# Patient Record
Sex: Female | Born: 1945
Health system: Southern US, Community
[De-identification: ages and names within clinical notes are randomized; demographics above are authoritative.]

## PROBLEM LIST (undated history)

## (undated) DIAGNOSIS — R05 Cough: Secondary | ICD-10-CM

## (undated) DIAGNOSIS — R233 Spontaneous ecchymoses: Secondary | ICD-10-CM

## (undated) DIAGNOSIS — R059 Cough, unspecified: Secondary | ICD-10-CM

## (undated) DIAGNOSIS — R5383 Other fatigue: Secondary | ICD-10-CM

## (undated) DIAGNOSIS — G4730 Sleep apnea, unspecified: Secondary | ICD-10-CM

## (undated) DIAGNOSIS — R0602 Shortness of breath: Secondary | ICD-10-CM

## (undated) DIAGNOSIS — J349 Unspecified disorder of nose and nasal sinuses: Secondary | ICD-10-CM

## (undated) DIAGNOSIS — Z460 Encounter for fitting and adjustment of spectacles and contact lenses: Secondary | ICD-10-CM

## (undated) DIAGNOSIS — M549 Dorsalgia, unspecified: Secondary | ICD-10-CM

## (undated) DIAGNOSIS — E785 Hyperlipidemia, unspecified: Secondary | ICD-10-CM

## (undated) DIAGNOSIS — R238 Other skin changes: Secondary | ICD-10-CM

## (undated) DIAGNOSIS — M1990 Unspecified osteoarthritis, unspecified site: Secondary | ICD-10-CM

## (undated) DIAGNOSIS — R0989 Other specified symptoms and signs involving the circulatory and respiratory systems: Secondary | ICD-10-CM

## (undated) DIAGNOSIS — I10 Essential (primary) hypertension: Secondary | ICD-10-CM

## (undated) HISTORY — DX: Dorsalgia, unspecified: M54.9

## (undated) HISTORY — DX: Encounter for fitting and adjustment of spectacles and contact lenses: Z46.0

## (undated) HISTORY — DX: Other fatigue: R53.83

## (undated) HISTORY — DX: Cough, unspecified: R05.9

## (undated) HISTORY — DX: Hyperlipidemia, unspecified: E78.5

## (undated) HISTORY — DX: Other skin changes: R23.8

## (undated) HISTORY — DX: Other specified symptoms and signs involving the circulatory and respiratory systems: R09.89

## (undated) HISTORY — DX: Unspecified osteoarthritis, unspecified site: M19.90

## (undated) HISTORY — DX: Unspecified disorder of nose and nasal sinuses: J34.9

## (undated) HISTORY — DX: Essential (primary) hypertension: I10

## (undated) HISTORY — DX: Spontaneous ecchymoses: R23.3

## (undated) HISTORY — PX: HERNIA REPAIR: SHX51

## (undated) HISTORY — DX: Cough: R05

## (undated) HISTORY — DX: Sleep apnea, unspecified: G47.30

## (undated) HISTORY — DX: Shortness of breath: R06.02

## (undated) HISTORY — PX: CATARACT EXTRACTION: SUR2

---

## 1985-07-10 HISTORY — PX: CHOLECYSTECTOMY: SHX55

## 1998-01-20 ENCOUNTER — Ambulatory Visit (HOSPITAL_COMMUNITY): Admission: RE | Admit: 1998-01-20 | Discharge: 1998-01-21 | Payer: Self-pay | Admitting: Cardiology

## 1998-02-17 ENCOUNTER — Ambulatory Visit (HOSPITAL_BASED_OUTPATIENT_CLINIC_OR_DEPARTMENT_OTHER): Admission: RE | Admit: 1998-02-17 | Discharge: 1998-02-17 | Payer: Self-pay | Admitting: Surgery

## 2001-01-28 ENCOUNTER — Ambulatory Visit (HOSPITAL_COMMUNITY): Admission: RE | Admit: 2001-01-28 | Discharge: 2001-01-29 | Payer: Self-pay | Admitting: Ophthalmology

## 2001-01-28 ENCOUNTER — Encounter: Payer: Self-pay | Admitting: Ophthalmology

## 2001-07-10 HISTORY — PX: EYE SURGERY: SHX253

## 2002-02-13 ENCOUNTER — Ambulatory Visit (HOSPITAL_COMMUNITY): Admission: RE | Admit: 2002-02-13 | Discharge: 2002-02-14 | Payer: Self-pay | Admitting: *Deleted

## 2009-06-17 ENCOUNTER — Ambulatory Visit (HOSPITAL_COMMUNITY): Admission: RE | Admit: 2009-06-17 | Discharge: 2009-06-17 | Payer: Self-pay | Admitting: Surgery

## 2009-07-15 ENCOUNTER — Encounter: Admission: RE | Admit: 2009-07-15 | Discharge: 2009-10-13 | Payer: Self-pay | Admitting: Surgery

## 2010-02-17 ENCOUNTER — Encounter: Admission: RE | Admit: 2010-02-17 | Discharge: 2010-04-07 | Payer: Self-pay | Admitting: Surgery

## 2010-03-22 ENCOUNTER — Ambulatory Visit (HOSPITAL_COMMUNITY): Admission: RE | Admit: 2010-03-22 | Discharge: 2010-03-23 | Payer: Self-pay | Admitting: Surgery

## 2010-03-22 HISTORY — PX: LAPAROSCOPIC GASTRIC BANDING: SHX1100

## 2010-04-05 ENCOUNTER — Encounter
Admission: RE | Admit: 2010-04-05 | Discharge: 2010-04-05 | Payer: Self-pay | Source: Home / Self Care | Attending: Surgery | Admitting: Surgery

## 2010-05-17 ENCOUNTER — Encounter
Admission: RE | Admit: 2010-05-17 | Discharge: 2010-08-09 | Payer: Self-pay | Source: Home / Self Care | Attending: Surgery | Admitting: Surgery

## 2010-09-15 ENCOUNTER — Encounter: Admit: 2010-09-15 | Payer: Self-pay | Admitting: Surgery

## 2010-09-15 ENCOUNTER — Ambulatory Visit: Payer: Self-pay | Admitting: *Deleted

## 2010-09-22 LAB — GLUCOSE, CAPILLARY
Glucose-Capillary: 212 mg/dL — ABNORMAL HIGH (ref 70–99)
Glucose-Capillary: 244 mg/dL — ABNORMAL HIGH (ref 70–99)
Glucose-Capillary: 247 mg/dL — ABNORMAL HIGH (ref 70–99)
Glucose-Capillary: 248 mg/dL — ABNORMAL HIGH (ref 70–99)
Glucose-Capillary: 265 mg/dL — ABNORMAL HIGH (ref 70–99)

## 2010-09-22 LAB — COMPREHENSIVE METABOLIC PANEL
ALT: 35 U/L (ref 0–35)
AST: 32 U/L (ref 0–37)
Albumin: 3.8 g/dL (ref 3.5–5.2)
Alkaline Phosphatase: 68 U/L (ref 39–117)
BUN: 15 mg/dL (ref 6–23)
CO2: 26 mEq/L (ref 19–32)
Calcium: 9.3 mg/dL (ref 8.4–10.5)
Chloride: 100 mEq/L (ref 96–112)
Creatinine, Ser: 0.99 mg/dL (ref 0.4–1.2)
GFR calc Af Amer: >60 mL/min (ref >60)
GFR calc non Af Amer: 56 mL/min — ABNORMAL LOW (ref >60)
Glucose, Bld: 264 mg/dL — ABNORMAL HIGH (ref 70–99)
Potassium: 4.6 mEq/L (ref 3.5–5.1)
Sodium: 137 mEq/L (ref 135–145)
Total Bilirubin: 0.6 mg/dL (ref 0.3–1.2)
Total Protein: 7.2 g/dL (ref 6.0–8.3)

## 2010-09-22 LAB — CBC
HCT: 48.8 % — ABNORMAL HIGH (ref 36.0–46.0)
Hemoglobin: 16.2 g/dL — ABNORMAL HIGH (ref 12.0–15.0)
MCH: 29.6 pg (ref 26.0–34.0)
MCHC: 33.2 g/dL (ref 30.0–36.0)
MCV: 88.6 fL (ref 78.0–100.0)
MCV: 89.2 fL (ref 78.0–100.0)
Platelets: 170 10*3/uL (ref 150–400)
Platelets: 200 10*3/uL (ref 150–400)
RBC: 4.8 MIL/uL (ref 3.87–5.11)
RBC: 5.47 MIL/uL — ABNORMAL HIGH (ref 3.87–5.11)
RDW: 14.1 % (ref 11.5–15.5)
WBC: 7.6 10*3/uL (ref 4.0–10.5)
WBC: 8.7 10*3/uL (ref 4.0–10.5)

## 2010-09-22 LAB — DIFFERENTIAL
Eosinophils Absolute: 0.1 10*3/uL (ref 0.0–0.7)
Eosinophils Relative: 1 % (ref 0–5)
Eosinophils Relative: 1 % (ref 0–5)
Lymphocytes Relative: 24 % (ref 12–46)
Lymphs Abs: 2.1 10*3/uL (ref 0.7–4.0)
Lymphs Abs: 2.2 10*3/uL (ref 0.7–4.0)
Monocytes Relative: 6 % (ref 3–12)
Neutro Abs: 5.9 10*3/uL (ref 1.7–7.7)
Neutrophils Relative %: 68 % (ref 43–77)

## 2010-09-22 LAB — SURGICAL PCR SCREEN: MRSA, PCR: NEGATIVE

## 2010-09-29 ENCOUNTER — Encounter: Payer: Medicare Other | Attending: Surgery | Admitting: *Deleted

## 2010-09-29 DIAGNOSIS — Z09 Encounter for follow-up examination after completed treatment for conditions other than malignant neoplasm: Secondary | ICD-10-CM | POA: Insufficient documentation

## 2010-09-29 DIAGNOSIS — Z713 Dietary counseling and surveillance: Secondary | ICD-10-CM | POA: Insufficient documentation

## 2010-09-29 DIAGNOSIS — Z9884 Bariatric surgery status: Secondary | ICD-10-CM | POA: Insufficient documentation

## 2010-11-25 NOTE — Cardiovascular Report (Signed)
NAME:  Holly Spence, Holly Spence                          ACCOUNT NO.:  [id number]   MEDICAL RECORD NO.:  [id number]                   PATIENT TYPE:  OIB   LOCATION:  2899                                 FACILITY:  [facility]   PHYSICIAN:  Learta Codding, M.D. [facility]             DATE OF BIRTH:  16-Jul-1945   DATE OF PROCEDURE:  DATE OF DISCHARGE:                              CARDIAC CATHETERIZATION   PROCEDURE:  1. Left heart catheterization with selective coronary angiography.  2. Ventriculography.   DIAGNOSES:  1. No evidence of flow-limiting epicardial coronary artery disease.  2. Normal left ventricular systolic function.  3. Small antegrade dissection of common femoral artery (iatrogenic) with     preserved distal pulses.   CARDIOLOGIST:  Learta Codding, M.D.   INDICATIONS FOR PROCEDURE:  The patient is a 65 year old white female with  multiple risk factors for coronary artery disease.  The patient had a  catheterization in 1999 by Dr. Clarene Duke with no evidence of CAD.  However, the  patient now reports substernal chest pain and underwent a Cardiolite study  which reportedly was positive.  The patient has been referred by Dr.  Tomie China for diagnostic catheterization to assess the coronary anatomy.   DESCRIPTION OF PROCEDURE:  After informed consent was obtained, the patient  was brought to the catheterization laboratory.  The right groin was  sterilely prepped and draped.  Right groin access was extremely difficult  due to the patient's morbid obesity.  Several attempts were needed to access  the common femoral artery and place a 6-French long sheath.  On initial  attempt on entering the femoral artery, difficulty was encountered advancing  the wire.  The wire and stent were pulled back as well as the needle.  On  the second attempt, the artery was successfully cannulated, and a 6-French  arterial long sheath was placed using modified Seldinger technique.  Position of catheter was confirmed  with injection of contrast.  It did  appear that there was a small antegrade dissection plane in the distal  common femoral artery; however, there was normal distal flow.  In addition,  the patient throughout the procedure maintained good distal pulses.  Following this,  6-French JR4 and JL4 catheters were used to engage the left  and right coronary arteries, respectively.  Selective coronary angiography  was performed in various projections using manual injection of contrast.  Following coronary angiography, a 6-French angled pigtail catheter was  placed in the left ventricular cavity, and left heart hemodynamics were  obtained.  Ventriculography was performed in single plane RAO projection  with power  injection of contrast.  At the termination of the procedure, all  catheters and  sheaths were removed, and the patient was brought back to the  holding area.  Instructions were given to provide adequate hemostasis, but  at the time of transfer to the holding area, no hematoma was noted around  the sheath. In addition, the patient had 2+ distal pulses bilaterally.  No  other complications were encountered.   FINDINGS:   HEMODYNAMICS:  Aortic pressure 127/75 mmHg, left ventricular pressure 127/16  mmHg.   VENTRICULOGRAPHY:  Ejection fraction 60% without segmental wall motion  abnormality.  There was no mitral regurgitation.    SELECTIVE CORONARY ANGIOGRAPHY:  1. The left main coronary artery was free of flow-limiting epicardial     coronary artery disease.  2. The left anterior descending artery was a moderate size vessel giving     rise to a several diagonal branches which were free of  free of flow-     limiting disease.  3. The circumflex coronary artery did not demonstrate flow-limiting CAD.  4. The right coronary artery is a large caliber vessel with no evidence of     flow-limiting coronary artery disease.  The RCA terminates in posterior     descending artery and two  posterolateral branches.   DISTAL AORTOGRAPHY:  No aneurysm was seen.  The bilateral renal arteries  were patent.  As documented above, there appeared to be a small dissection  plane in the distal common femoral artery without impairment of flow.   RECOMMENDATIONS:  The patient appeared to have had a false positive  Cardiolite study.  There is no evidence of epicardial coronary artery  disease.  A 36-hour bed rest is needed, and distal pulses will be closely  monitored due to small iatrogenic antegrade dissection of the common femoral  artery.   PLAN:  Keep the patient overnight.  Monitor her pulses closely, and obtain  arterial and venous Dopplers of the right groin in the morning.  If the  patient has no bleeding and no pain complaints, she can be discharged.                                               Learta Codding, M.D. [facility]    GED/MEDQ  D:  02/13/2002  T:  02/18/2002  Job:  [id number]

## 2010-11-25 NOTE — Discharge Summary (Signed)
NAME:  Holly Spence, Holly Spence                          ACCOUNT NO.:  [id number]   MEDICAL RECORD NO.:  [id number]                   PATIENT TYPE:  OIB   LOCATION:  6532                                 FACILITY:  [facility]   PHYSICIAN:  Joellyn Rued                        DATE OF BIRTH:  1946/03/12   DATE OF ADMISSION:  02/13/2002  DATE OF DISCHARGE:                                 DISCHARGE SUMMARY   SUMMARY OF HISTORY:  Holly Spence is a 65 year old white female who was  referred to [facility] for cardiac catheterization.  She describes a four-  week history of chest discomfort which she describes as an aching radiating  into her neck and associated with shortness of breath and diaphoresis.  She  states that the discomfort occurs with activity, at rest, and at night.  It  is on and off in about 30 seconds.  There is no change with food or movement  with exertion or position.  The patient states she saw her primary MD and  had Cardiolite on 02/05/2002.  It revealed anterior ischemia.  Thus she was  referred to Dr. Cherlynn June he had given her a prescription for  nitroglycerin and referred her here for cardiac catheterization.  She has  not used any nitroglycerin; however she has had continuing episodes of chest  discomfort which seem more frequent at night. She also has a history of  hypertension, non-insulin-dependent diabetes mellitus, asthma, arthritis,  obesity, and hiatal hernia.  Her lipid status is unknown.  There is an early  family history of coronary artery disease.   LABORATORY DATA:  At [facility], sodium was 137, potassium3.9,  glucose 180, BUN 14,creatinine 0.9.  TSH was 0.92.  PT 10.0, PTT 26.3.  Hemoglobin 11.9, hematocrit 35.6, MCV slightly low at 74,  MCHC  slightly  low at 24.6, platelets 372, WBC 9.6.   Chest x-ray did not show any acute abnormalities.   HOSPITAL COURSE:  The patient came to day care and underwent cardiac  catheterization by Dr. Andee Lineman.  According to  his progress note, her EF was  60%.  She did not have any coronary artery, renal artery, or lower extremity  peripheral vascular disease.  Dr. Andee Lineman notes that during the procedure,  she had a small antegrade dissection in the OFA secondary to unsuccessful  stick.  Sheath removed on bedrest.  She was ambulating in the halls without  difficulty and catheterization site was intact.  Right groin Dopplers were  obtained to make sure she did not have a pseudoaneurysm or A-V fistula with  a complicated stick.   On the morning of August 8, hemoglobin, hematocrit, and chemistries were  unchanged, and it was felt that she could be discharged home.   DISCHARGE DIAGNOSES:  1. Atypical chest discomfort with a false positive stress Cardiolite.  She  did not have any coronary artery disease, and she has normal left     ventricular function.  She has multiple cardiac risk factors such as     obesity, diabetes, remote tobacco, and possible hyperlipidemia.  She also     has hyperchromic normocytic anemia, unknown if this has been evaluated in     the past.   DISPOSITION:  She is discharged home.   DISCHARGE MEDICATIONS:  She was asked to continue her home medications to  include:  1. Coated aspirin 325 mg q.d.  2. Allegra 180 q.d.  3. Altace 2.5 q.d.  4. She was asked to resume Glucophage 500 mg 2 tablets b.i.d.  5. HCTZ 25 q.d.  6. Cardia XT 180 q.d.  7. Accolate 20 mg b.i.d.  8. Bextra 20 mg q.d.  9. Paxil 20 mg q.d.  10.      Avandia 8 mg q.d.  11.      Omeprazole  20 mg q.d.  12.      She was told she did not necessarily need sublingual nitroglycerin.   ACTIVITY:  No lifting, driving, sexual activity, or heavy exertion for two  days.   DIET:  Low fat, low cholesterol, ADA.   FOLLOW UP:  If she has any problems with her catheterization site, she was  asked to call Dr. Tomie China immediately.  She was asked to call Dr. Tomie China  to arrange a followup one to two-week appointment with  him.  Consideration  should be given to evaluate hyperchromic normocytic anemia, lipid status,  and possible sleep apnea.  (The patient does state that during the night she  does wake up because she stops breathing.  However, she has not been  evaluated for sleep apnea.)                                                Joellyn Rued    EW/MEDQ  D:  02/14/2002  T:  02/18/2002  Job:  [id number]   cc:   Primus Bravo R. Revankar, M.D.

## 2011-01-31 ENCOUNTER — Encounter: Payer: Self-pay | Admitting: *Deleted

## 2011-01-31 ENCOUNTER — Encounter: Payer: Medicare Other | Attending: Surgery | Admitting: *Deleted

## 2011-01-31 DIAGNOSIS — Z9884 Bariatric surgery status: Secondary | ICD-10-CM

## 2011-01-31 DIAGNOSIS — Z09 Encounter for follow-up examination after completed treatment for conditions other than malignant neoplasm: Secondary | ICD-10-CM | POA: Insufficient documentation

## 2011-01-31 DIAGNOSIS — Z713 Dietary counseling and surveillance: Secondary | ICD-10-CM | POA: Insufficient documentation

## 2011-01-31 HISTORY — DX: Bariatric surgery status: Z98.84

## 2011-01-31 NOTE — Progress Notes (Signed)
  Follow-up visit: 9-10 Month Post-Operative LAGB Surgery  Medical Nutrition Therapy:  Appt start time: 1000 end time:  1030.  Assessment:  Primary concerns today: post-operative bariatric surgery nutrition management.  Weight today: 262.2 lbs Weight change: 7 lbs gain March 2012 Total weight lost: 52.8 lbs total BMI: 49.5% Weight goal: 150-200 % Weight goal met: 48%  24-hr recall:  B (10 AM): 1/2 cup grits, 1 egg, 1 pc toast (with NS jelly) Snk (AM): SKIPS  L (12-2 PM): cottage cheese with pineapple OR SF pudding  Snk (PM): SKIPS D (6-7 PM): 2 oz roast beef, fresh vegetables Snk (7-9 PM): yogurt cup or 1 pc fruit  Fluid intake: water (propel), milk, tomato juice, SF drink mix = 48-64 oz Estimated total protein intake: 40-60g  Medications: Lantus (15 units hs), Lisinopril (10mg ), Glipizide ER (10mg , am) Supplementation: Pt following ASMBS recommendations  CBG monitoring: qd Average CBG per patient: FBS= 125-150 (Pt reports 3 "blood sugar crashes" of <70) Last patient reported A1c: 7% (per pt)  Using straws: No Drinking while eating: No Hair loss: Non reported  Carbonated beverages: None N/V/D/C: Pt reports that she "throws up phlegm" when eating too fast; Pt reports that its hard to chew food well due to missing teeth. Last Lap-Band fill: March 2012 (Pt reports 2 fills total)  Recent physical activity:  "I don't exercise too much because its too hot and my back bothers me too bad"  Progress Towards Goal(s):  In progress.   Nutritional Diagnosis:  Catharine-3.3 Overweight/obesity As related to recent LAGB surgery.  As evidenced by pt continuing to follow LAGB guidelines with limited physical activity.    Intervention:    Follow Phase 3B: High Protein + Non-Starchy Vegetables  Eat 3-6 small meals/snacks, every 3-5 hrs  Increase lean protein foods to meet 70g goal  Increase fluid intake to 64oz +  Add 15 grams of carbohydrate (fruit, whole grain, starchy vegetable) with  meals  Avoid drinking 15 minutes before, during and 30 minutes after eating  Aim for >30 min of physical activity daily  Monitoring/Evaluation:  Dietary intake, exercise, lap band fills, and body weight. Follow up in 3 months for 12 month post-op visit.

## 2011-01-31 NOTE — Patient Instructions (Signed)
Goals:  Follow Phase 3B: High Protein + Non-Starchy Vegetables  Eat 3-6 small meals/snacks, every 3-5 hrs  Increase lean protein foods to meet 70g goal  Increase fluid intake to 64oz +  Add 15 grams of carbohydrate (fruit, whole grain, starchy vegetable) with meals  Avoid drinking 15 minutes before, during and 30 minutes after eating  Aim for >30 min of physical activity daily 

## 2011-02-01 ENCOUNTER — Encounter (INDEPENDENT_AMBULATORY_CARE_PROVIDER_SITE_OTHER): Payer: Self-pay | Admitting: General Surgery

## 2011-02-02 ENCOUNTER — Ambulatory Visit (INDEPENDENT_AMBULATORY_CARE_PROVIDER_SITE_OTHER): Payer: 59 | Admitting: Surgery

## 2011-02-02 ENCOUNTER — Encounter (INDEPENDENT_AMBULATORY_CARE_PROVIDER_SITE_OTHER): Payer: Self-pay | Admitting: Surgery

## 2011-02-02 DIAGNOSIS — Z4651 Encounter for fitting and adjustment of gastric lap band: Secondary | ICD-10-CM

## 2011-02-02 DIAGNOSIS — Z9884 Bariatric surgery status: Secondary | ICD-10-CM

## 2011-02-02 NOTE — Patient Instructions (Signed)
Stay on liquids for 2 days

## 2011-02-02 NOTE — Progress Notes (Signed)
Patient returns and has gained a little bit away. She hasn't been fill since March 22. Today's weight is 262. She reports no reflux since we repaired her hiatal hernia. Today had a half cc to her band. Will see her back in 8 weeks.  She does report some drooling at night which she has done her whole life. She's not having any new nocturnal reflux. She has been eating carbohydrates in that his slow her weight loss. I encouraged her to stay on a high protein diet.  Past Medical History  Diagnosis Date  . Diabetes mellitus   . Hypertension   . Hyperlipidemia   . Arthritis   . Sleep apnea   . Back pain    No current outpatient prescriptions on file.

## 2011-03-22 ENCOUNTER — Encounter (INDEPENDENT_AMBULATORY_CARE_PROVIDER_SITE_OTHER): Payer: Medicare Other | Admitting: Surgery

## 2011-05-02 ENCOUNTER — Ambulatory Visit: Payer: Medicare Other | Admitting: *Deleted

## 2011-05-04 ENCOUNTER — Encounter: Payer: Medicare Other | Attending: Surgery | Admitting: *Deleted

## 2011-05-04 ENCOUNTER — Encounter (INDEPENDENT_AMBULATORY_CARE_PROVIDER_SITE_OTHER): Payer: Self-pay | Admitting: Surgery

## 2011-05-04 ENCOUNTER — Encounter: Payer: Self-pay | Admitting: *Deleted

## 2011-05-04 ENCOUNTER — Ambulatory Visit: Payer: Medicare Other | Admitting: *Deleted

## 2011-05-04 ENCOUNTER — Ambulatory Visit (INDEPENDENT_AMBULATORY_CARE_PROVIDER_SITE_OTHER): Payer: Medicare Other | Admitting: Surgery

## 2011-05-04 VITALS — BP 132/86 | HR 60 | Temp 96.9°F | Resp 20 | Ht 61.0 in | Wt 266.1 lb

## 2011-05-04 DIAGNOSIS — Z9884 Bariatric surgery status: Secondary | ICD-10-CM

## 2011-05-04 DIAGNOSIS — Z09 Encounter for follow-up examination after completed treatment for conditions other than malignant neoplasm: Secondary | ICD-10-CM | POA: Insufficient documentation

## 2011-05-04 DIAGNOSIS — Z713 Dietary counseling and surveillance: Secondary | ICD-10-CM | POA: Insufficient documentation

## 2011-05-04 NOTE — Progress Notes (Signed)
  Follow-up visit: 13 Months Post-Operative LAGB Surgery  Medical Nutrition Therapy:  Appt start time: 1130 end time:  1200.  Assessment:  Primary concerns today: post-operative bariatric surgery nutrition management. Holly Spence reports that she has not been doing very well lately due to family stress at home. She notes that she has not had as much time to take care of herself and has found herself doing some "stress eating". She appears to be motivated to get back on track with her diet as she came in with a list of questions as to how she should change her eating plan. It appears she has been consuming increased amounts of carbohydrate foods and sweets.  Weight today: 266 lbs Weight change: 3.8 lb gain Total weight lost: 49 lbs total BMI: 50.4 Weight goal: 150-200 lbs  24-hr recall:  B (AM): 1 egg, 1 pc. Toast, 1/2 cup grits Snk ( AM): ice cream (1/2 cup)   L (PM): 1/2 can Light Progresso soup, 6 crackers Snk (PM): Skips  D (PM): Tuna (4 oz) OR 1 1/2 cup pinto beans  Snk (PM): Cookies (3-4)  Fluid intake: 50-60 oz (sugar free) Estimated total protein intake: 50-60g  Medications: See updated medication list Supplementation: Taking regularly  CBG monitoring: Daily Average CBG per patient: 150-200's (Fasting) Last patient reported A1c: 7.3%  No results found for this basename: HGBA1C   Using straws: No Drinking while eating: No Hair loss: No Carbonated beverages: No N/V/D/C: No Last Lap-Band fill: No recent band fill  Recent physical activity:  Limited due to mobility/lack of motivation to move  Progress Towards Goal(s):  Some progress.  Handouts given during visit include:  Carbohydrate counting card  Pre-Op Diet   Nutritional Diagnosis:  NI-5.8.2 Excessive carbohydrate intake As related to increased portions of carbohydrate rich foods and sweets.  As evidenced by elevated A1c, little to no weight loss since last visit.    Intervention:  Nutrition  education.  Monitoring/Evaluation:  Dietary intake, exercise, lap band fills, and body weight. Follow up in 1-2 months for 15-16 month post-op visit.

## 2011-05-04 NOTE — Patient Instructions (Signed)
Goals:  Follow Phase 3B: High Protein + Non-Starchy Vegetables  Or RESTART Pre-Op Diet Meal Format  Eat 3-6 small meals/snacks, every 3-5 hrs  Increase lean protein foods to meet 60-70g goal  Increase fluid intake to 64oz +  Consume only 15 grams of carbohydrate (fruit, whole grain, starchy vegetable) with meals if needed  Consume 5-15 grams protein with meals  Avoid drinking 15 minutes before, during and 30 minutes after eating  Aim for >30 min of physical activity daily

## 2011-05-04 NOTE — Progress Notes (Signed)
Holly Spence comes in with her friend and ride.  She has a 400 lb daughter who has been bringing sweets into the home making it difficult to stay on her diet.  She gags with bread and feels that she is too tight.  I encouraged her to stay on protein shakes and protein based diet.  Will see her back in 2 months.  No fill today. She is having no GER since repair of her HH>

## 2011-05-04 NOTE — Patient Instructions (Signed)
Avoid carbs See me in 2 months

## 2011-07-06 ENCOUNTER — Encounter (INDEPENDENT_AMBULATORY_CARE_PROVIDER_SITE_OTHER): Payer: Medicare Other | Admitting: Surgery

## 2011-07-28 ENCOUNTER — Encounter (INDEPENDENT_AMBULATORY_CARE_PROVIDER_SITE_OTHER): Payer: Medicare Other

## 2011-08-01 ENCOUNTER — Encounter: Payer: Medicare Other | Attending: Surgery | Admitting: *Deleted

## 2011-08-01 DIAGNOSIS — Z9884 Bariatric surgery status: Secondary | ICD-10-CM | POA: Insufficient documentation

## 2011-08-01 DIAGNOSIS — Z09 Encounter for follow-up examination after completed treatment for conditions other than malignant neoplasm: Secondary | ICD-10-CM | POA: Insufficient documentation

## 2011-08-01 DIAGNOSIS — Z713 Dietary counseling and surveillance: Secondary | ICD-10-CM | POA: Insufficient documentation

## 2011-08-01 NOTE — Patient Instructions (Signed)
Goals:  Follow up with Holly Spence on Band Fill Removal  Follow Phase 3B: High Protein + Non-Starchy Vegetables    - Or Restart Pre-Op Diet    Eat 3-6 small meals/snacks, every 3-5 hrs  Increase lean protein foods to meet 70g goal  Increase fluid intake to 64oz +  Avoid concentrated sweets  Avoid drinking 15 minutes before, during and 30 minutes after eating  Aim for >30 min of physical activity daily 

## 2011-08-01 NOTE — Progress Notes (Signed)
  Follow-up visit: 16 Months Post-Operative LAGB Surgery  Medical Nutrition Therapy:  Appt start time: 1010 end time:  1030.  Assessment:  Primary concerns today: post-operative bariatric surgery nutrition management. Holly Spence reports that over the holidays she has been very ill with flu like symptoms. She believes her band is too tight as she is not able to keep down solid pieces of meat. She does eat slowly and chew well, as she has always eaten this way. She does have a problem with overeating and overcompensating with bread, cereals, rice, and pasta. She notes poor food choices over the holidays as well with pie, cookies, and cake making their way into the diet. Pt to seen Mardelle Matte on 2/1 for band fill removal.  Weight today: 278.1 lbs Weight change: 12.1 lbs gain Total weight lost: 36.9 lbs BMI: 52.7% Weight goal: 150-200 lbs  Surgery date: 03/2010 [facility] weight at [facility]: 315 lbs  24-hr recall:  B (AM): 1 cup oatmeal OR 1/2 cup grits  Snk ( AM): ice cream (1/2 cup)  L (PM): 1/2 can Light Progresso soup, 6 crackers  Snk (PM): Skips  D (PM): 1-2 oz ham OR Protein Shake Snk (PM): Cookies (3-4) OR Ice cream (1-2 cups)  Fluid intake: 50-60 oz (sugar free)  Estimated total protein intake: 30-40g   Medications: See updated medication list; PCP increased Lantus to 20 units (from 10)  Supplementation: Taking regularly  CBG monitoring: 1-2 times/day Average CBG per patient: Before meals 150-200's; Multiple episodes of hypoglycemia (60-65) Last patient reported A1c: 8.3% per PCP (06/2011)  No results found for this basename: HGBA1C   Using straws: No  Drinking while eating: No  Hair loss: No  Carbonated beverages: No  N/V/D/C: No  Last Lap-Band fill: No recent band fill  Recent physical activity: Limited due to mobility issues, back pain, and lack of motivation to move  Progress Towards Goal(s): Some progress.   Handouts given during visit include:  Carbohydrate counting card  Pre-Op  Diet  Nutritional Diagnosis:  NI-5.8.2 Excessive carbohydrate intake As related to increased portions of carbohydrate rich foods and sweets. As evidenced by elevated A1c, little to no weight loss since last visit.   Intervention: Nutrition education.   Monitoring/Evaluation: Dietary intake, exercise, lap band fills, and body weight. Follow up in 1-2 months for 18-20 month post-op visit.

## 2011-08-11 ENCOUNTER — Encounter (INDEPENDENT_AMBULATORY_CARE_PROVIDER_SITE_OTHER): Payer: Medicare Other

## 2011-09-27 ENCOUNTER — Encounter (INDEPENDENT_AMBULATORY_CARE_PROVIDER_SITE_OTHER): Payer: Self-pay | Admitting: Surgery

## 2011-09-28 ENCOUNTER — Encounter (INDEPENDENT_AMBULATORY_CARE_PROVIDER_SITE_OTHER): Payer: Self-pay

## 2011-09-28 ENCOUNTER — Ambulatory Visit (INDEPENDENT_AMBULATORY_CARE_PROVIDER_SITE_OTHER): Payer: Medicare Other | Admitting: Physician Assistant

## 2011-09-28 VITALS — BP 138/74 | HR 72 | Temp 97.8°F | Resp 18 | Ht 61.0 in | Wt 280.8 lb

## 2011-09-28 DIAGNOSIS — Z4651 Encounter for fitting and adjustment of gastric lap band: Secondary | ICD-10-CM

## 2011-09-28 NOTE — Progress Notes (Signed)
  HISTORY: Holly Spence is a 66 y.o.female who received an AP-Large lap-band in September 2011 by Dr. Daphine Deutscher. She comes in complaining of frequent intolerance of solids, more bad days than good.  VITAL SIGNS: Filed Vitals:   09/28/11 1131  BP: 138/74  Pulse: 72  Temp: 97.8 F (36.6 C)  Resp: 18    PHYSICAL EXAM: Physical exam reveals a very well-appearing 66 y.o.female in no apparent distress Neurologic: Awake, alert, oriented Psych: Bright affect, conversant Respiratory: Breathing even and unlabored. No stridor or wheezing Abdomen: Soft, nontender, nondistended to palpation. Incisions well-healed. No incisional hernias. Port easily palpated. Extremities: Atraumatic, good range of motion.  ASSESMENT: 66 y.o.  female  s/p AP-Large lap-band.   PLAN: The patient's port was accessed with a 20G Huber needle without difficulty. Clear fluid was aspirated and 0.5 mL saline was removed from the port. I asked the patient to contact us next week if her obstructive symptoms persist despite fluid removal. She would need an upper GI study in this case. Otherwise I asked her to return in 4-6 weeks.

## 2011-09-28 NOTE — Patient Instructions (Signed)
Return in 4-6 weeks or sooner if needed. Contact Tracy (Dr. Ermalene Searing nurse) to schedule an upper GI study if you still have trouble with solid foods despite our last fluid removal.

## 2011-10-02 ENCOUNTER — Encounter: Payer: Self-pay | Admitting: *Deleted

## 2011-10-02 ENCOUNTER — Encounter: Payer: Medicare Other | Attending: Surgery | Admitting: *Deleted

## 2011-10-02 VITALS — Ht 61.0 in | Wt 279.2 lb

## 2011-10-02 DIAGNOSIS — Z713 Dietary counseling and surveillance: Secondary | ICD-10-CM | POA: Insufficient documentation

## 2011-10-02 DIAGNOSIS — Z9884 Bariatric surgery status: Secondary | ICD-10-CM | POA: Insufficient documentation

## 2011-10-02 DIAGNOSIS — Z09 Encounter for follow-up examination after completed treatment for conditions other than malignant neoplasm: Secondary | ICD-10-CM | POA: Insufficient documentation

## 2011-10-02 NOTE — Patient Instructions (Addendum)
Goals:  Follow up with Holly Spence on Band Fill Removal  Follow Phase 3B: High Protein + Non-Starchy Vegetables    - Or Restart Pre-Op Diet    Eat 3-6 small meals/snacks, every 3-5 hrs  Increase lean protein foods to meet 70g goal  Increase fluid intake to 64oz +  Avoid concentrated sweets  Avoid drinking 15 minutes before, during and 30 minutes after eating  Aim for >30 min of physical activity daily

## 2011-10-02 NOTE — Progress Notes (Signed)
  Follow-up visit: 18 Months Post-Operative LAGB Surgery  Medical Nutrition Therapy:  Appt start time: 1000 end time:  1030.  Assessment:  Primary concerns today: post-operative bariatric surgery nutrition management. Holly Spence had a band fill removal from [place] last week, and pt notes that since she has not had any regurgitation. Holly Spence reports extreme amounts of emotional stress. She states that she cannot take care of herself due to her family problems and stress.  Weight today: 279.2 lbs Weight change: n/a Total weight lost: 35.8 lbs BMI: 52.9% Weight goal: 150-200 lbs  Surgery date: 03/2010 Start weight at [facility]: 315 lbs  24-hr recall:  B (AM): Protein shake OR 1 egg w/ 1 piece of cheese Snk ( AM): yogurt cup (6 oz) OR SF jello cup L (PM): SKIPS d/t getting up late  Snk (2 PM): SF Jello cup or 2 oz cheese D (7-8 PM): 2 oz cheese w/ 1/2 cup canned pineapple OR Lima beans (1/2 cup), Pork roast (2 oz), 1 cup potatoes (fried) Snk (PM): n/a  Fluid intake: 50-60 oz (sugar free)  Estimated total protein intake: 60-70g   Medications: See updated medication list; PCP increased Lantus to 20 units (from 10)  Supplementation: Taking regularly  CBG monitoring: 1-2 times/day Average CBG per patient: Before meals 200-250's; 2hrs post: 160-170's;  Pt denies any hypoglycemia Last patient reported A1c: 8.3% per PCP (06/2011)  No results found for this basename: HGBA1C   Using straws: No  Drinking while eating: No  Hair loss: No  Carbonated beverages: No  N/V/D/C: No  Last Lap-Band fill: Mardelle Matte removed 1/2 cc fluid from band.  Recent physical activity: Limited due to mobility issues though hip pain is improving. Trying to walk dog 1-2 times/day for 10 minutes.  Progress Towards Goal(s): Some progress.   Handouts given during visit include:  Carbohydrate counting card  Pre-Op Diet  Nutritional Diagnosis:  NI-5.8.2 Excessive carbohydrate intake As related to increased portions of  carbohydrate rich foods and sweets. As evidenced by elevated A1c, little to no weight loss since last visit.   Intervention: Nutrition education.   Monitoring/Evaluation: Dietary intake, exercise, lap band fills, and body weight. Follow up in 3-4 months for 21-22 month post-op visit.

## 2011-11-09 ENCOUNTER — Encounter (INDEPENDENT_AMBULATORY_CARE_PROVIDER_SITE_OTHER): Payer: Medicare Other

## 2011-11-09 ENCOUNTER — Ambulatory Visit: Payer: Medicare Other | Admitting: *Deleted

## 2012-01-23 ENCOUNTER — Telehealth (INDEPENDENT_AMBULATORY_CARE_PROVIDER_SITE_OTHER): Payer: Self-pay | Admitting: General Surgery

## 2012-01-23 DIAGNOSIS — Z9884 Bariatric surgery status: Secondary | ICD-10-CM

## 2012-01-23 DIAGNOSIS — R109 Unspecified abdominal pain: Secondary | ICD-10-CM

## 2012-01-23 NOTE — Telephone Encounter (Signed)
Message copied by Latricia Heft on Tue Jan 23, 2012 11:30 AM ------      Message from: Zacarias Pontes      Created: Tue Jan 23, 2012 11:08 AM       Pt asked that you call her,she wants to have an UGI and she has some questions for you.Marland KitchenMarland [phone]

## 2012-01-23 NOTE — Telephone Encounter (Signed)
The patient states she is having pains in her stomach and per Oliva Bustard PA last office visit note if it continues we are to schedule an UGI. Patient has requested it be scheduled at [facility] spoke with Marchelle Folks at [facility] pt scheduled 01/25/12 @8 :00 pt to be NPO after midnight, pt requested for me to LM on AM if no answer. Notified the patient. Faxed order to [phone]

## 2012-01-29 ENCOUNTER — Encounter (INDEPENDENT_AMBULATORY_CARE_PROVIDER_SITE_OTHER): Payer: Self-pay | Admitting: Family Medicine

## 2012-01-29 ENCOUNTER — Encounter (INDEPENDENT_AMBULATORY_CARE_PROVIDER_SITE_OTHER): Payer: Self-pay

## 2012-01-31 ENCOUNTER — Telehealth (INDEPENDENT_AMBULATORY_CARE_PROVIDER_SITE_OTHER): Payer: Self-pay

## 2012-01-31 NOTE — Telephone Encounter (Signed)
The patient called for her UGI results.  Holly Spence had this done at [facility]  I gave her the results.  They were normal.  Holly Spence states Holly Spence will call back when Holly Spence has transportation arranged so Holly Spence can see Mardelle Matte.  Holly Spence is gaining weight.

## 2015-09-20 DIAGNOSIS — I10 Essential (primary) hypertension: Secondary | ICD-10-CM

## 2015-09-20 DIAGNOSIS — K219 Gastro-esophageal reflux disease without esophagitis: Secondary | ICD-10-CM | POA: Insufficient documentation

## 2015-09-20 DIAGNOSIS — E1165 Type 2 diabetes mellitus with hyperglycemia: Secondary | ICD-10-CM

## 2015-09-20 DIAGNOSIS — Z794 Long term (current) use of insulin: Secondary | ICD-10-CM

## 2015-09-20 DIAGNOSIS — IMO0001 Reserved for inherently not codable concepts without codable children: Secondary | ICD-10-CM | POA: Insufficient documentation

## 2015-09-20 DIAGNOSIS — E785 Hyperlipidemia, unspecified: Secondary | ICD-10-CM | POA: Insufficient documentation

## 2015-09-20 DIAGNOSIS — J309 Allergic rhinitis, unspecified: Secondary | ICD-10-CM

## 2015-09-20 HISTORY — DX: Allergic rhinitis, unspecified: J30.9

## 2015-09-20 HISTORY — DX: Essential (primary) hypertension: I10

## 2015-09-20 HISTORY — DX: Reserved for inherently not codable concepts without codable children: IMO0001

## 2015-09-20 HISTORY — DX: Gastro-esophageal reflux disease without esophagitis: K21.9

## 2015-09-20 HISTORY — DX: Long term (current) use of insulin: Z79.4

## 2015-11-10 DIAGNOSIS — E1169 Type 2 diabetes mellitus with other specified complication: Secondary | ICD-10-CM

## 2015-11-10 DIAGNOSIS — E088 Diabetes mellitus due to underlying condition with unspecified complications: Secondary | ICD-10-CM | POA: Insufficient documentation

## 2015-11-10 DIAGNOSIS — R002 Palpitations: Secondary | ICD-10-CM

## 2015-11-10 HISTORY — DX: Palpitations: R00.2

## 2015-11-10 HISTORY — DX: Type 2 diabetes mellitus with other specified complication: E11.69

## 2015-11-10 HISTORY — DX: Diabetes mellitus due to underlying condition with unspecified complications: E08.8

## 2015-12-25 DIAGNOSIS — L0390 Cellulitis, unspecified: Secondary | ICD-10-CM | POA: Diagnosis not present

## 2015-12-30 DIAGNOSIS — G8929 Other chronic pain: Secondary | ICD-10-CM | POA: Diagnosis not present

## 2015-12-30 DIAGNOSIS — L98499 Non-pressure chronic ulcer of skin of other sites with unspecified severity: Secondary | ICD-10-CM | POA: Diagnosis not present

## 2015-12-30 DIAGNOSIS — M1990 Unspecified osteoarthritis, unspecified site: Secondary | ICD-10-CM | POA: Diagnosis not present

## 2015-12-31 DIAGNOSIS — E11622 Type 2 diabetes mellitus with other skin ulcer: Secondary | ICD-10-CM | POA: Diagnosis not present

## 2015-12-31 DIAGNOSIS — T63301A Toxic effect of unspecified spider venom, accidental (unintentional), initial encounter: Secondary | ICD-10-CM | POA: Diagnosis not present

## 2015-12-31 DIAGNOSIS — E119 Type 2 diabetes mellitus without complications: Secondary | ICD-10-CM | POA: Diagnosis not present

## 2015-12-31 DIAGNOSIS — I10 Essential (primary) hypertension: Secondary | ICD-10-CM | POA: Diagnosis not present

## 2015-12-31 DIAGNOSIS — S61203A Unspecified open wound of left middle finger without damage to nail, initial encounter: Secondary | ICD-10-CM | POA: Diagnosis not present

## 2016-01-03 DIAGNOSIS — S61402A Unspecified open wound of left hand, initial encounter: Secondary | ICD-10-CM | POA: Diagnosis not present

## 2016-01-07 DIAGNOSIS — E119 Type 2 diabetes mellitus without complications: Secondary | ICD-10-CM | POA: Diagnosis not present

## 2016-01-07 DIAGNOSIS — S61253A Open bite of left middle finger without damage to nail, initial encounter: Secondary | ICD-10-CM | POA: Diagnosis not present

## 2016-01-07 DIAGNOSIS — T63301A Toxic effect of unspecified spider venom, accidental (unintentional), initial encounter: Secondary | ICD-10-CM | POA: Diagnosis not present

## 2016-02-02 DIAGNOSIS — E119 Type 2 diabetes mellitus without complications: Secondary | ICD-10-CM | POA: Diagnosis not present

## 2016-02-02 DIAGNOSIS — Z794 Long term (current) use of insulin: Secondary | ICD-10-CM | POA: Diagnosis not present

## 2016-02-22 DIAGNOSIS — I10 Essential (primary) hypertension: Secondary | ICD-10-CM | POA: Diagnosis not present

## 2016-02-22 DIAGNOSIS — Z794 Long term (current) use of insulin: Secondary | ICD-10-CM | POA: Diagnosis not present

## 2016-02-22 DIAGNOSIS — E1165 Type 2 diabetes mellitus with hyperglycemia: Secondary | ICD-10-CM | POA: Diagnosis not present

## 2016-02-22 DIAGNOSIS — E785 Hyperlipidemia, unspecified: Secondary | ICD-10-CM | POA: Diagnosis not present

## 2016-04-14 ENCOUNTER — Encounter (HOSPITAL_COMMUNITY): Payer: Self-pay

## 2016-05-03 DIAGNOSIS — Z794 Long term (current) use of insulin: Secondary | ICD-10-CM | POA: Diagnosis not present

## 2016-05-03 DIAGNOSIS — E119 Type 2 diabetes mellitus without complications: Secondary | ICD-10-CM | POA: Diagnosis not present

## 2016-05-03 DIAGNOSIS — E785 Hyperlipidemia, unspecified: Secondary | ICD-10-CM | POA: Diagnosis not present

## 2016-05-04 DIAGNOSIS — Z23 Encounter for immunization: Secondary | ICD-10-CM | POA: Diagnosis not present

## 2016-06-13 DIAGNOSIS — R05 Cough: Secondary | ICD-10-CM | POA: Diagnosis not present

## 2016-06-13 DIAGNOSIS — E1149 Type 2 diabetes mellitus with other diabetic neurological complication: Secondary | ICD-10-CM | POA: Diagnosis not present

## 2016-06-13 DIAGNOSIS — Z0000 Encounter for general adult medical examination without abnormal findings: Secondary | ICD-10-CM | POA: Diagnosis not present

## 2016-06-13 DIAGNOSIS — Z9181 History of falling: Secondary | ICD-10-CM | POA: Diagnosis not present

## 2016-06-13 DIAGNOSIS — Z1389 Encounter for screening for other disorder: Secondary | ICD-10-CM | POA: Diagnosis not present

## 2016-07-06 DIAGNOSIS — N6342 Unspecified lump in left breast, subareolar: Secondary | ICD-10-CM | POA: Diagnosis not present

## 2016-07-06 DIAGNOSIS — R05 Cough: Secondary | ICD-10-CM | POA: Diagnosis not present

## 2016-07-06 DIAGNOSIS — R928 Other abnormal and inconclusive findings on diagnostic imaging of breast: Secondary | ICD-10-CM | POA: Diagnosis not present

## 2016-07-06 DIAGNOSIS — N6321 Unspecified lump in the left breast, upper outer quadrant: Secondary | ICD-10-CM | POA: Diagnosis not present

## 2016-08-04 DIAGNOSIS — E119 Type 2 diabetes mellitus without complications: Secondary | ICD-10-CM | POA: Diagnosis not present

## 2016-08-04 DIAGNOSIS — Z794 Long term (current) use of insulin: Secondary | ICD-10-CM | POA: Diagnosis not present

## 2016-08-04 DIAGNOSIS — E785 Hyperlipidemia, unspecified: Secondary | ICD-10-CM | POA: Diagnosis not present

## 2016-08-30 DIAGNOSIS — J329 Chronic sinusitis, unspecified: Secondary | ICD-10-CM | POA: Diagnosis not present

## 2016-08-30 DIAGNOSIS — R600 Localized edema: Secondary | ICD-10-CM | POA: Diagnosis not present

## 2016-08-30 DIAGNOSIS — G629 Polyneuropathy, unspecified: Secondary | ICD-10-CM | POA: Diagnosis not present

## 2016-09-21 DIAGNOSIS — I10 Essential (primary) hypertension: Secondary | ICD-10-CM | POA: Diagnosis not present

## 2016-09-21 DIAGNOSIS — Z794 Long term (current) use of insulin: Secondary | ICD-10-CM | POA: Diagnosis not present

## 2016-09-21 DIAGNOSIS — E1165 Type 2 diabetes mellitus with hyperglycemia: Secondary | ICD-10-CM | POA: Diagnosis not present

## 2016-09-21 DIAGNOSIS — E785 Hyperlipidemia, unspecified: Secondary | ICD-10-CM | POA: Diagnosis not present

## 2016-09-22 DIAGNOSIS — T23229A Burn of second degree of unspecified single finger (nail) except thumb, initial encounter: Secondary | ICD-10-CM | POA: Diagnosis not present

## 2016-10-04 DIAGNOSIS — E119 Type 2 diabetes mellitus without complications: Secondary | ICD-10-CM | POA: Diagnosis not present

## 2016-10-04 DIAGNOSIS — E785 Hyperlipidemia, unspecified: Secondary | ICD-10-CM | POA: Diagnosis not present

## 2016-10-04 DIAGNOSIS — Z794 Long term (current) use of insulin: Secondary | ICD-10-CM | POA: Diagnosis not present

## 2016-10-06 DIAGNOSIS — M1990 Unspecified osteoarthritis, unspecified site: Secondary | ICD-10-CM | POA: Diagnosis not present

## 2016-10-06 DIAGNOSIS — R0602 Shortness of breath: Secondary | ICD-10-CM | POA: Diagnosis not present

## 2016-10-06 DIAGNOSIS — G8929 Other chronic pain: Secondary | ICD-10-CM | POA: Diagnosis not present

## 2016-10-06 DIAGNOSIS — R600 Localized edema: Secondary | ICD-10-CM | POA: Diagnosis not present

## 2016-10-06 DIAGNOSIS — R269 Unspecified abnormalities of gait and mobility: Secondary | ICD-10-CM | POA: Diagnosis not present

## 2016-10-23 ENCOUNTER — Encounter (HOSPITAL_COMMUNITY): Payer: Self-pay

## 2017-02-01 DIAGNOSIS — E119 Type 2 diabetes mellitus without complications: Secondary | ICD-10-CM | POA: Diagnosis not present

## 2017-02-01 DIAGNOSIS — E785 Hyperlipidemia, unspecified: Secondary | ICD-10-CM | POA: Diagnosis not present

## 2017-02-01 DIAGNOSIS — Z794 Long term (current) use of insulin: Secondary | ICD-10-CM | POA: Diagnosis not present

## 2017-02-08 DIAGNOSIS — E1165 Type 2 diabetes mellitus with hyperglycemia: Secondary | ICD-10-CM | POA: Diagnosis not present

## 2017-02-08 DIAGNOSIS — Z794 Long term (current) use of insulin: Secondary | ICD-10-CM | POA: Diagnosis not present

## 2017-02-08 DIAGNOSIS — E785 Hyperlipidemia, unspecified: Secondary | ICD-10-CM | POA: Diagnosis not present

## 2017-02-08 DIAGNOSIS — I10 Essential (primary) hypertension: Secondary | ICD-10-CM | POA: Diagnosis not present

## 2017-02-12 DIAGNOSIS — E785 Hyperlipidemia, unspecified: Secondary | ICD-10-CM | POA: Diagnosis not present

## 2017-02-12 DIAGNOSIS — M158 Other polyosteoarthritis: Secondary | ICD-10-CM | POA: Diagnosis not present

## 2017-02-12 DIAGNOSIS — Z79899 Other long term (current) drug therapy: Secondary | ICD-10-CM | POA: Diagnosis not present

## 2017-02-12 DIAGNOSIS — I10 Essential (primary) hypertension: Secondary | ICD-10-CM | POA: Diagnosis not present

## 2017-02-12 DIAGNOSIS — Z9119 Patient's noncompliance with other medical treatment and regimen: Secondary | ICD-10-CM | POA: Diagnosis not present

## 2017-02-12 DIAGNOSIS — E1165 Type 2 diabetes mellitus with hyperglycemia: Secondary | ICD-10-CM | POA: Diagnosis not present

## 2017-02-12 DIAGNOSIS — E559 Vitamin D deficiency, unspecified: Secondary | ICD-10-CM | POA: Diagnosis not present

## 2017-04-17 DIAGNOSIS — R269 Unspecified abnormalities of gait and mobility: Secondary | ICD-10-CM | POA: Diagnosis not present

## 2017-04-17 DIAGNOSIS — G8929 Other chronic pain: Secondary | ICD-10-CM | POA: Diagnosis not present

## 2017-04-17 DIAGNOSIS — R600 Localized edema: Secondary | ICD-10-CM | POA: Diagnosis not present

## 2017-04-17 DIAGNOSIS — R0602 Shortness of breath: Secondary | ICD-10-CM | POA: Diagnosis not present

## 2017-04-17 DIAGNOSIS — M1990 Unspecified osteoarthritis, unspecified site: Secondary | ICD-10-CM | POA: Diagnosis not present

## 2017-05-04 DIAGNOSIS — E785 Hyperlipidemia, unspecified: Secondary | ICD-10-CM | POA: Diagnosis not present

## 2017-05-04 DIAGNOSIS — Z794 Long term (current) use of insulin: Secondary | ICD-10-CM | POA: Diagnosis not present

## 2017-05-04 DIAGNOSIS — E119 Type 2 diabetes mellitus without complications: Secondary | ICD-10-CM | POA: Diagnosis not present

## 2017-05-07 DIAGNOSIS — M1990 Unspecified osteoarthritis, unspecified site: Secondary | ICD-10-CM | POA: Diagnosis not present

## 2017-05-07 DIAGNOSIS — Z23 Encounter for immunization: Secondary | ICD-10-CM | POA: Diagnosis not present

## 2017-05-07 DIAGNOSIS — J45909 Unspecified asthma, uncomplicated: Secondary | ICD-10-CM | POA: Diagnosis not present

## 2017-05-14 DIAGNOSIS — E559 Vitamin D deficiency, unspecified: Secondary | ICD-10-CM | POA: Diagnosis not present

## 2017-05-14 DIAGNOSIS — G8929 Other chronic pain: Secondary | ICD-10-CM | POA: Diagnosis not present

## 2017-05-14 DIAGNOSIS — Z0000 Encounter for general adult medical examination without abnormal findings: Secondary | ICD-10-CM | POA: Diagnosis not present

## 2017-05-14 DIAGNOSIS — R5381 Other malaise: Secondary | ICD-10-CM | POA: Diagnosis not present

## 2017-05-14 DIAGNOSIS — E1142 Type 2 diabetes mellitus with diabetic polyneuropathy: Secondary | ICD-10-CM | POA: Diagnosis not present

## 2017-05-14 DIAGNOSIS — Z79899 Other long term (current) drug therapy: Secondary | ICD-10-CM | POA: Diagnosis not present

## 2017-05-14 DIAGNOSIS — J019 Acute sinusitis, unspecified: Secondary | ICD-10-CM | POA: Diagnosis not present

## 2017-05-14 DIAGNOSIS — Z23 Encounter for immunization: Secondary | ICD-10-CM | POA: Diagnosis not present

## 2017-05-14 DIAGNOSIS — R002 Palpitations: Secondary | ICD-10-CM | POA: Diagnosis not present

## 2017-06-14 DIAGNOSIS — G8929 Other chronic pain: Secondary | ICD-10-CM | POA: Diagnosis not present

## 2017-06-14 DIAGNOSIS — J069 Acute upper respiratory infection, unspecified: Secondary | ICD-10-CM | POA: Diagnosis not present

## 2017-06-21 ENCOUNTER — Encounter: Payer: Self-pay | Admitting: Neurology

## 2017-07-16 DIAGNOSIS — Z1331 Encounter for screening for depression: Secondary | ICD-10-CM | POA: Diagnosis not present

## 2017-07-16 DIAGNOSIS — N959 Unspecified menopausal and perimenopausal disorder: Secondary | ICD-10-CM | POA: Diagnosis not present

## 2017-07-16 DIAGNOSIS — E785 Hyperlipidemia, unspecified: Secondary | ICD-10-CM | POA: Diagnosis not present

## 2017-07-16 DIAGNOSIS — Z139 Encounter for screening, unspecified: Secondary | ICD-10-CM | POA: Diagnosis not present

## 2017-07-16 DIAGNOSIS — H6690 Otitis media, unspecified, unspecified ear: Secondary | ICD-10-CM | POA: Diagnosis not present

## 2017-07-16 DIAGNOSIS — Z1211 Encounter for screening for malignant neoplasm of colon: Secondary | ICD-10-CM | POA: Diagnosis not present

## 2017-07-16 DIAGNOSIS — Z1231 Encounter for screening mammogram for malignant neoplasm of breast: Secondary | ICD-10-CM | POA: Diagnosis not present

## 2017-07-16 DIAGNOSIS — Z0000 Encounter for general adult medical examination without abnormal findings: Secondary | ICD-10-CM | POA: Diagnosis not present

## 2017-07-16 DIAGNOSIS — Z9181 History of falling: Secondary | ICD-10-CM | POA: Diagnosis not present

## 2017-07-16 DIAGNOSIS — J45909 Unspecified asthma, uncomplicated: Secondary | ICD-10-CM | POA: Diagnosis not present

## 2017-07-16 DIAGNOSIS — Z1389 Encounter for screening for other disorder: Secondary | ICD-10-CM | POA: Diagnosis not present

## 2017-07-19 DIAGNOSIS — J45909 Unspecified asthma, uncomplicated: Secondary | ICD-10-CM | POA: Diagnosis not present

## 2017-08-01 DIAGNOSIS — M8580 Other specified disorders of bone density and structure, unspecified site: Secondary | ICD-10-CM | POA: Diagnosis not present

## 2017-08-01 DIAGNOSIS — N959 Unspecified menopausal and perimenopausal disorder: Secondary | ICD-10-CM | POA: Diagnosis not present

## 2017-08-01 DIAGNOSIS — Z1231 Encounter for screening mammogram for malignant neoplasm of breast: Secondary | ICD-10-CM | POA: Diagnosis not present

## 2017-08-01 DIAGNOSIS — M85852 Other specified disorders of bone density and structure, left thigh: Secondary | ICD-10-CM | POA: Diagnosis not present

## 2017-08-16 DIAGNOSIS — M1990 Unspecified osteoarthritis, unspecified site: Secondary | ICD-10-CM | POA: Diagnosis not present

## 2017-08-16 DIAGNOSIS — E785 Hyperlipidemia, unspecified: Secondary | ICD-10-CM | POA: Diagnosis not present

## 2017-08-16 DIAGNOSIS — I10 Essential (primary) hypertension: Secondary | ICD-10-CM | POA: Diagnosis not present

## 2017-08-19 DIAGNOSIS — J45909 Unspecified asthma, uncomplicated: Secondary | ICD-10-CM | POA: Diagnosis not present

## 2017-08-20 DIAGNOSIS — Z1211 Encounter for screening for malignant neoplasm of colon: Secondary | ICD-10-CM | POA: Diagnosis not present

## 2017-08-20 DIAGNOSIS — Z1212 Encounter for screening for malignant neoplasm of rectum: Secondary | ICD-10-CM | POA: Diagnosis not present

## 2017-08-22 DIAGNOSIS — E1165 Type 2 diabetes mellitus with hyperglycemia: Secondary | ICD-10-CM | POA: Diagnosis not present

## 2017-08-22 DIAGNOSIS — Z794 Long term (current) use of insulin: Secondary | ICD-10-CM | POA: Diagnosis not present

## 2017-08-22 DIAGNOSIS — I10 Essential (primary) hypertension: Secondary | ICD-10-CM | POA: Diagnosis not present

## 2017-08-22 DIAGNOSIS — E785 Hyperlipidemia, unspecified: Secondary | ICD-10-CM | POA: Diagnosis not present

## 2017-09-16 DIAGNOSIS — J45909 Unspecified asthma, uncomplicated: Secondary | ICD-10-CM | POA: Diagnosis not present

## 2017-09-25 DIAGNOSIS — J069 Acute upper respiratory infection, unspecified: Secondary | ICD-10-CM | POA: Diagnosis not present

## 2017-10-09 ENCOUNTER — Ambulatory Visit: Payer: Medicare Other | Admitting: Neurology

## 2017-10-17 DIAGNOSIS — J45909 Unspecified asthma, uncomplicated: Secondary | ICD-10-CM | POA: Diagnosis not present

## 2017-10-19 ENCOUNTER — Encounter (HOSPITAL_COMMUNITY): Payer: Self-pay

## 2017-11-02 DIAGNOSIS — E785 Hyperlipidemia, unspecified: Secondary | ICD-10-CM | POA: Diagnosis not present

## 2017-11-02 DIAGNOSIS — E119 Type 2 diabetes mellitus without complications: Secondary | ICD-10-CM | POA: Diagnosis not present

## 2017-11-02 DIAGNOSIS — Z794 Long term (current) use of insulin: Secondary | ICD-10-CM | POA: Diagnosis not present

## 2017-11-16 DIAGNOSIS — G8929 Other chronic pain: Secondary | ICD-10-CM | POA: Diagnosis not present

## 2017-11-16 DIAGNOSIS — J45909 Unspecified asthma, uncomplicated: Secondary | ICD-10-CM | POA: Diagnosis not present

## 2017-11-16 DIAGNOSIS — R269 Unspecified abnormalities of gait and mobility: Secondary | ICD-10-CM | POA: Diagnosis not present

## 2017-11-16 DIAGNOSIS — R0602 Shortness of breath: Secondary | ICD-10-CM | POA: Diagnosis not present

## 2017-11-16 DIAGNOSIS — M1990 Unspecified osteoarthritis, unspecified site: Secondary | ICD-10-CM | POA: Diagnosis not present

## 2017-11-16 DIAGNOSIS — R600 Localized edema: Secondary | ICD-10-CM | POA: Diagnosis not present

## 2017-11-19 DIAGNOSIS — J45909 Unspecified asthma, uncomplicated: Secondary | ICD-10-CM | POA: Diagnosis not present

## 2017-11-20 DIAGNOSIS — E1142 Type 2 diabetes mellitus with diabetic polyneuropathy: Secondary | ICD-10-CM | POA: Diagnosis not present

## 2017-11-20 DIAGNOSIS — I10 Essential (primary) hypertension: Secondary | ICD-10-CM | POA: Diagnosis not present

## 2017-11-20 DIAGNOSIS — E559 Vitamin D deficiency, unspecified: Secondary | ICD-10-CM | POA: Diagnosis not present

## 2017-11-20 DIAGNOSIS — E785 Hyperlipidemia, unspecified: Secondary | ICD-10-CM | POA: Diagnosis not present

## 2017-11-20 DIAGNOSIS — Z79899 Other long term (current) drug therapy: Secondary | ICD-10-CM | POA: Diagnosis not present

## 2017-12-06 DIAGNOSIS — I10 Essential (primary) hypertension: Secondary | ICD-10-CM | POA: Diagnosis not present

## 2017-12-06 DIAGNOSIS — E119 Type 2 diabetes mellitus without complications: Secondary | ICD-10-CM | POA: Diagnosis not present

## 2017-12-06 DIAGNOSIS — Z794 Long term (current) use of insulin: Secondary | ICD-10-CM | POA: Diagnosis not present

## 2017-12-06 DIAGNOSIS — E785 Hyperlipidemia, unspecified: Secondary | ICD-10-CM | POA: Diagnosis not present

## 2017-12-17 DIAGNOSIS — J45909 Unspecified asthma, uncomplicated: Secondary | ICD-10-CM | POA: Diagnosis not present

## 2017-12-28 DIAGNOSIS — H31001 Unspecified chorioretinal scars, right eye: Secondary | ICD-10-CM | POA: Diagnosis not present

## 2017-12-28 DIAGNOSIS — E119 Type 2 diabetes mellitus without complications: Secondary | ICD-10-CM | POA: Diagnosis not present

## 2017-12-28 DIAGNOSIS — H16141 Punctate keratitis, right eye: Secondary | ICD-10-CM | POA: Diagnosis not present

## 2017-12-28 DIAGNOSIS — H52223 Regular astigmatism, bilateral: Secondary | ICD-10-CM | POA: Diagnosis not present

## 2017-12-28 DIAGNOSIS — H5213 Myopia, bilateral: Secondary | ICD-10-CM | POA: Diagnosis not present

## 2018-01-16 DIAGNOSIS — J45909 Unspecified asthma, uncomplicated: Secondary | ICD-10-CM | POA: Diagnosis not present

## 2018-01-22 DIAGNOSIS — E785 Hyperlipidemia, unspecified: Secondary | ICD-10-CM | POA: Diagnosis not present

## 2018-01-22 DIAGNOSIS — J019 Acute sinusitis, unspecified: Secondary | ICD-10-CM | POA: Diagnosis not present

## 2018-01-22 DIAGNOSIS — I10 Essential (primary) hypertension: Secondary | ICD-10-CM | POA: Diagnosis not present

## 2018-01-22 DIAGNOSIS — G8929 Other chronic pain: Secondary | ICD-10-CM | POA: Diagnosis not present

## 2018-01-22 DIAGNOSIS — Z1339 Encounter for screening examination for other mental health and behavioral disorders: Secondary | ICD-10-CM | POA: Diagnosis not present

## 2018-02-01 DIAGNOSIS — E785 Hyperlipidemia, unspecified: Secondary | ICD-10-CM | POA: Diagnosis not present

## 2018-02-01 DIAGNOSIS — E119 Type 2 diabetes mellitus without complications: Secondary | ICD-10-CM | POA: Diagnosis not present

## 2018-02-01 DIAGNOSIS — Z794 Long term (current) use of insulin: Secondary | ICD-10-CM | POA: Diagnosis not present

## 2018-02-16 DIAGNOSIS — J45909 Unspecified asthma, uncomplicated: Secondary | ICD-10-CM | POA: Diagnosis not present

## 2018-03-14 DIAGNOSIS — J45909 Unspecified asthma, uncomplicated: Secondary | ICD-10-CM | POA: Diagnosis not present

## 2018-03-19 DIAGNOSIS — J45909 Unspecified asthma, uncomplicated: Secondary | ICD-10-CM | POA: Diagnosis not present

## 2018-04-11 DIAGNOSIS — J45909 Unspecified asthma, uncomplicated: Secondary | ICD-10-CM | POA: Diagnosis not present

## 2018-04-18 DIAGNOSIS — J45909 Unspecified asthma, uncomplicated: Secondary | ICD-10-CM | POA: Diagnosis not present

## 2018-04-24 DIAGNOSIS — E1142 Type 2 diabetes mellitus with diabetic polyneuropathy: Secondary | ICD-10-CM | POA: Diagnosis not present

## 2018-04-24 DIAGNOSIS — R002 Palpitations: Secondary | ICD-10-CM | POA: Diagnosis not present

## 2018-04-24 DIAGNOSIS — G8929 Other chronic pain: Secondary | ICD-10-CM | POA: Diagnosis not present

## 2018-04-24 DIAGNOSIS — Z79899 Other long term (current) drug therapy: Secondary | ICD-10-CM | POA: Diagnosis not present

## 2018-04-24 DIAGNOSIS — I10 Essential (primary) hypertension: Secondary | ICD-10-CM | POA: Diagnosis not present

## 2018-04-24 DIAGNOSIS — E785 Hyperlipidemia, unspecified: Secondary | ICD-10-CM | POA: Diagnosis not present

## 2018-04-24 DIAGNOSIS — E559 Vitamin D deficiency, unspecified: Secondary | ICD-10-CM | POA: Diagnosis not present

## 2018-05-06 DIAGNOSIS — Z794 Long term (current) use of insulin: Secondary | ICD-10-CM | POA: Diagnosis not present

## 2018-05-06 DIAGNOSIS — E785 Hyperlipidemia, unspecified: Secondary | ICD-10-CM | POA: Diagnosis not present

## 2018-05-06 DIAGNOSIS — E119 Type 2 diabetes mellitus without complications: Secondary | ICD-10-CM | POA: Diagnosis not present

## 2018-05-17 DIAGNOSIS — J45909 Unspecified asthma, uncomplicated: Secondary | ICD-10-CM | POA: Diagnosis not present

## 2018-05-19 DIAGNOSIS — J45909 Unspecified asthma, uncomplicated: Secondary | ICD-10-CM | POA: Diagnosis not present

## 2018-05-28 ENCOUNTER — Ambulatory Visit: Payer: Medicare Other | Admitting: Cardiology

## 2018-06-18 DIAGNOSIS — J45909 Unspecified asthma, uncomplicated: Secondary | ICD-10-CM | POA: Diagnosis not present

## 2018-07-01 DIAGNOSIS — M1990 Unspecified osteoarthritis, unspecified site: Secondary | ICD-10-CM | POA: Diagnosis not present

## 2018-07-01 DIAGNOSIS — R600 Localized edema: Secondary | ICD-10-CM | POA: Diagnosis not present

## 2018-07-01 DIAGNOSIS — E1142 Type 2 diabetes mellitus with diabetic polyneuropathy: Secondary | ICD-10-CM | POA: Diagnosis not present

## 2018-07-01 DIAGNOSIS — R269 Unspecified abnormalities of gait and mobility: Secondary | ICD-10-CM | POA: Diagnosis not present

## 2018-07-01 DIAGNOSIS — G8929 Other chronic pain: Secondary | ICD-10-CM | POA: Diagnosis not present

## 2018-07-12 DIAGNOSIS — J45909 Unspecified asthma, uncomplicated: Secondary | ICD-10-CM | POA: Diagnosis not present

## 2018-07-24 DIAGNOSIS — G8929 Other chronic pain: Secondary | ICD-10-CM | POA: Diagnosis not present

## 2018-07-24 DIAGNOSIS — E785 Hyperlipidemia, unspecified: Secondary | ICD-10-CM | POA: Diagnosis not present

## 2018-07-24 DIAGNOSIS — I10 Essential (primary) hypertension: Secondary | ICD-10-CM | POA: Diagnosis not present

## 2018-07-24 DIAGNOSIS — E1142 Type 2 diabetes mellitus with diabetic polyneuropathy: Secondary | ICD-10-CM | POA: Diagnosis not present

## 2018-07-24 DIAGNOSIS — E559 Vitamin D deficiency, unspecified: Secondary | ICD-10-CM | POA: Diagnosis not present

## 2018-07-24 DIAGNOSIS — R300 Dysuria: Secondary | ICD-10-CM | POA: Diagnosis not present

## 2018-08-01 DIAGNOSIS — R42 Dizziness and giddiness: Secondary | ICD-10-CM | POA: Diagnosis not present

## 2018-08-01 DIAGNOSIS — Z9181 History of falling: Secondary | ICD-10-CM | POA: Diagnosis not present

## 2018-08-01 DIAGNOSIS — Z139 Encounter for screening, unspecified: Secondary | ICD-10-CM | POA: Diagnosis not present

## 2018-08-01 DIAGNOSIS — R600 Localized edema: Secondary | ICD-10-CM | POA: Diagnosis not present

## 2018-08-01 DIAGNOSIS — R350 Frequency of micturition: Secondary | ICD-10-CM | POA: Diagnosis not present

## 2018-08-01 DIAGNOSIS — H73893 Other specified disorders of tympanic membrane, bilateral: Secondary | ICD-10-CM | POA: Diagnosis not present

## 2018-08-06 DIAGNOSIS — E785 Hyperlipidemia, unspecified: Secondary | ICD-10-CM | POA: Diagnosis not present

## 2018-08-06 DIAGNOSIS — E119 Type 2 diabetes mellitus without complications: Secondary | ICD-10-CM | POA: Diagnosis not present

## 2018-08-06 DIAGNOSIS — J45909 Unspecified asthma, uncomplicated: Secondary | ICD-10-CM | POA: Diagnosis not present

## 2018-08-06 DIAGNOSIS — Z794 Long term (current) use of insulin: Secondary | ICD-10-CM | POA: Diagnosis not present

## 2018-08-12 DIAGNOSIS — R42 Dizziness and giddiness: Secondary | ICD-10-CM | POA: Diagnosis not present

## 2018-08-12 DIAGNOSIS — I6523 Occlusion and stenosis of bilateral carotid arteries: Secondary | ICD-10-CM | POA: Diagnosis not present

## 2018-09-05 DIAGNOSIS — J45909 Unspecified asthma, uncomplicated: Secondary | ICD-10-CM | POA: Diagnosis not present

## 2018-09-30 DIAGNOSIS — J45909 Unspecified asthma, uncomplicated: Secondary | ICD-10-CM | POA: Diagnosis not present

## 2018-10-22 DIAGNOSIS — J069 Acute upper respiratory infection, unspecified: Secondary | ICD-10-CM | POA: Diagnosis not present

## 2018-10-22 DIAGNOSIS — I10 Essential (primary) hypertension: Secondary | ICD-10-CM | POA: Diagnosis not present

## 2018-10-22 DIAGNOSIS — E785 Hyperlipidemia, unspecified: Secondary | ICD-10-CM | POA: Diagnosis not present

## 2018-10-22 DIAGNOSIS — G8929 Other chronic pain: Secondary | ICD-10-CM | POA: Diagnosis not present

## 2018-10-22 DIAGNOSIS — H9201 Otalgia, right ear: Secondary | ICD-10-CM | POA: Diagnosis not present

## 2018-10-25 ENCOUNTER — Telehealth: Payer: Self-pay | Admitting: Cardiology

## 2018-10-25 NOTE — Telephone Encounter (Signed)
Virtual Visit Pre-Appointment Phone Call  Steps For Call:  1. Confirm consent - "In the setting of the current Covid19 crisis, you are scheduled for a (phone or video) visit with your provider on (date) at (time).  Just as we do with many in-office visits, in order for you to participate in this visit, we must obtain consent.  If you'd like, I can send this to your mychart (if signed up) or email for you to review.  Otherwise, I can obtain your verbal consent now.  All virtual visits are billed to your insurance company just like a normal visit would be.  By agreeing to a virtual visit, we'd like you to understand that the technology does not allow for your provider to perform an examination, and thus may limit your provider's ability to fully assess your condition. If your provider identifies any concerns that need to be evaluated in person, we will make arrangements to do so.  Finally, though the technology is pretty good, we cannot assure that it will always work on either your or our end, and in the setting of a video visit, we may have to convert it to a phone-only visit.  In either situation, we cannot ensure that we have a secure connection.  Are you willing to proceed?" STAFF: Did the patient verbally acknowledge consent to telehealth visit? Document YES/NO here: YES 2.   3. Confirm the BEST phone number to call the day of the visit by including in appointment notes  4. Give patient instructions for WebEx/MyChart download to smartphone as below or Doximity/Doxy.me if video visit (depending on what platform provider is using)  5. Advise patient to be prepared with their blood pressure, heart rate, weight, any heart rhythm information, their current medicines, and a piece of paper and pen handy for any instructions they may receive the day of their visit  6. Inform patient they will receive a phone call 15 minutes prior to their appointment time (may be from unknown caller ID) so they should  be prepared to answer  7. Confirm that appointment type is correct in Epic appointment notes (VIDEO vs PHONE)     TELEPHONE CALL NOTE  Holly Spence has been deemed a candidate for a follow-up tele-health visit to limit community exposure during the Covid-19 pandemic. I spoke with the patient via phone to ensure availability of phone/video source, confirm preferred email & phone number, and discuss instructions and expectations.  I reminded Holly Spence to be prepared with any vital sign and/or heart rhythm information that could potentially be obtained via home monitoring, at the time of her visit. I reminded Holly Spence to expect a phone call at the time of her visit if her visit.  Holly Spence 10/25/2018 3:04 PM   INSTRUCTIONS FOR DOWNLOADING THE WEBEX APP TO SMARTPHONE  - If Apple, ask patient to go to App Store and type in WebEx in the search bar. Download [employer], the blue/green circle. If Android, go to [employer] and type in [employer] in the search bar. The app is free but as with any other app downloads, their phone may require them to verify saved payment information or Apple/Android password.  - The patient does NOT have to create an account. - On the day of the visit, the assist will walk the patient through joining the meeting with the meeting number/password.  INSTRUCTIONS FOR DOWNLOADING THE MYCHART APP TO SMARTPHONE  - The patient must  first make sure to have activated MyChart and know their login information - If Apple, go to [employer] and type in MyChart in the search bar and download the app. If Android, ask patient to go to [employer] and type in [employer] in the search bar and download the app. The app is free but as with any other app downloads, their phone may require them to verify saved payment information or Apple/Android password.  - The patient will need to then log into the app with their MyChart username and password, and select [facility] as their healthcare provider to link the account. When it is time for your visit, go to the MyChart app, find appointments, and click Begin Video Visit. Be sure to Select Allow for your device to access the Microphone and Camera for your visit. You will then be connected, and your provider will be with you shortly.  **If they have any issues connecting, or need assistance please contact MyChart service desk [phone])**  **If using a computer, in order to ensure the best quality for their visit they will need to use either of the following Internet Browsers: [employer], or Google Chrome**  IF USING DOXIMITY or DOXY.ME - The patient will receive a link just prior to their visit, either by text or email (to be determined day of appointment depending on if it's doxy.me or Doximity).     FULL LENGTH CONSENT FOR TELE-HEALTH VISIT   I hereby voluntarily request, consent and authorize [facility] and its employed or contracted physicians, physician assistants, nurse practitioners or other licensed health care professionals (the Practitioner), to provide me with telemedicine health care services (the Services") as deemed necessary by the treating Practitioner. I acknowledge and consent to receive the Services by the Practitioner via telemedicine. I understand that the telemedicine visit will involve communicating with the Practitioner through live audiovisual communication technology and the disclosure of certain medical information by electronic transmission. I acknowledge that I have been given the opportunity to request an in-person assessment or other available alternative prior to the telemedicine visit and am voluntarily participating in the telemedicine visit.  I understand that I have the right to withhold or withdraw my consent to the use of telemedicine in the course of my care at any time, without affecting my right to future care or treatment, and that the  Practitioner or I may terminate the telemedicine visit at any time. I understand that I have the right to inspect all information obtained and/or recorded in the course of the telemedicine visit and may receive copies of available information for a reasonable fee.  I understand that some of the potential risks of receiving the Services via telemedicine include:   Delay or interruption in medical evaluation due to technological equipment failure or disruption;  Information transmitted may not be sufficient (e.g. poor resolution of images) to allow for appropriate medical decision making by the Practitioner; and/or   In rare instances, security protocols could fail, causing a breach of personal health information.  Furthermore, I acknowledge that it is my responsibility to provide information about my medical history, conditions and care that is complete and accurate to the best of my ability. I acknowledge that Practitioner's advice, recommendations, and/or decision may be based on factors not within their control, such as incomplete or inaccurate data provided by me or distortions of diagnostic images or specimens that may result from electronic transmissions. I understand that the practice of medicine is not  an Chief Strategy Officer and that Practitioner makes no warranties or guarantees regarding treatment outcomes. I acknowledge that I will receive a copy of this consent concurrently upon execution via email to the email address I last provided but may also request a printed copy by calling the office of [facility].    I understand that my insurance will be billed for this visit.   I have read or had this consent read to me.  I understand the contents of this consent, which adequately explains the benefits and risks of the Services being provided via telemedicine.   I have been provided ample opportunity to ask questions regarding this consent and the Services and have had my questions answered to my  satisfaction.  I give my informed consent for the services to be provided through the use of telemedicine in my medical care  By participating in this telemedicine visit I agree to the above.

## 2018-10-28 ENCOUNTER — Other Ambulatory Visit: Payer: Self-pay

## 2018-10-28 ENCOUNTER — Telehealth (INDEPENDENT_AMBULATORY_CARE_PROVIDER_SITE_OTHER): Payer: Medicare Other | Admitting: Cardiology

## 2018-10-28 ENCOUNTER — Encounter: Payer: Self-pay | Admitting: Cardiology

## 2018-10-28 VITALS — BP 145/85 | HR 74 | Ht 61.0 in | Wt 281.0 lb

## 2018-10-28 DIAGNOSIS — E782 Mixed hyperlipidemia: Secondary | ICD-10-CM

## 2018-10-28 DIAGNOSIS — I10 Essential (primary) hypertension: Secondary | ICD-10-CM

## 2018-10-28 DIAGNOSIS — J45909 Unspecified asthma, uncomplicated: Secondary | ICD-10-CM | POA: Diagnosis not present

## 2018-10-28 DIAGNOSIS — E088 Diabetes mellitus due to underlying condition with unspecified complications: Secondary | ICD-10-CM

## 2018-10-28 NOTE — Progress Notes (Signed)
Virtual Visit via Telephone Note   This visit type was conducted due to national recommendations for restrictions regarding the COVID-19 Pandemic (e.g. social distancing) in an effort to limit this patient's exposure and mitigate transmission in our community.  Due to her co-morbid illnesses, this patient is at least at moderate risk for complications without adequate follow up.  This format is felt to be most appropriate for this patient at this time.  The patient did not have access to video technology/had technical difficulties with video requiring transitioning to audio format only (telephone).  All issues noted in this document were discussed and addressed.  No physical exam could be performed with this format.  Please refer to the patient's chart for her  consent to telehealth for [facility].   Evaluation Performed:  Follow-up visit  Date:  10/28/2018   ID:  Holly Spence, Holly Spence 06/10/1946, MRN [id number]  Patient Location: Home Provider Location: Home  PCP:  Gabriel Cirri, DO  Cardiologist:  No primary care provider on file.  Electrophysiologist:  None   Chief Complaint: Shortness of breath  History of Present Illness:    Holly Spence is a 73 y.o. female with past medical history of essential hypertension and dyslipidemia.  She mentions to me that she was evaluated by me more than 2 years ago.  She noticed some shortness of breath.  She has an upper respiratory tract infection and she is being prescribed an antibiotic.  Subsequently she mentions to me that occasionally she will have swelling in her lower extremities.  No chest pain orthopnea or PND.  At the time of my evaluation, the patient is alert awake oriented and in no distress.  She appears comfortable over the phone.  The patient does not have symptoms concerning for COVID-19 infection (fever, chills, cough, or new shortness of breath).    Past Medical History:  Diagnosis Date  . Arthritis   . Back pain   .  Bruises easily   . Contact lens/glasses fitting   . Cough   . Diabetes mellitus   . Fatigue   . Hyperlipidemia   . Hypertension   . Poor circulation   . Sinus problem   . Sleep apnea   . SOB (shortness of breath)    Past Surgical History:  Procedure Laterality Date  . CATARACT EXTRACTION     confirm date with patient  . CHOLECYSTECTOMY  1987  . EYE SURGERY  2003   retina  . HERNIA REPAIR    . LAPAROSCOPIC GASTRIC BANDING  03/22/10     Current Meds  Medication Sig  . albuterol (VENTOLIN HFA) 108 (90 Base) MCG/ACT inhaler Inhale 1 puff into the lungs every 6 (six) hours as needed for wheezing or shortness of breath.  Marland Kitchen aspirin EC 81 MG tablet Take 81 mg by mouth daily.  . carvedilol (COREG) 6.25 MG tablet Take 1 tablet by mouth 2 (two) times a day.  . diclofenac (FLECTOR) 1.3 % PTCH Place 1 patch onto the skin 2 (two) times daily.  . insulin NPH-regular Human (70-30) 100 UNIT/ML injection Inject into the skin.  Marland Kitchen lisinopril (PRINIVIL,ZESTRIL) 10 MG tablet Take 10 mg by mouth daily.   Marland Kitchen lovastatin (MEVACOR) 20 MG tablet Take 1 tablet by mouth daily.  . meloxicam (MOBIC) 15 MG tablet Take 1 tablet by mouth at bedtime.  . montelukast (SINGULAIR) 10 MG tablet Take 1 tablet by mouth daily.  . Probiotic Product (PROBIOTIC-10 PO) Take by mouth.  Marland Kitchen  rOPINIRole (REQUIP) 1 MG tablet Take 1 tablet by mouth daily.  . traMADol (ULTRAM) 50 MG tablet Take 2 tablets by mouth 2 (two) times a day.     Allergies:   Penicillins and Erythromycin   Social History   Tobacco Use  . Smoking status: Never Smoker  . Smokeless tobacco: Never Used  Substance Use Topics  . Alcohol use: No  . Drug use: No     Family Hx: The patient's family history includes Arthritis in her mother; Diabetes in her father and sister; Heart disease in her father and mother; Hypertension in her mother.  ROS:   Please see the history of present illness.    Patient denies any chest pain orthopnea or PND but her  shortness of breath has occurred in the past few days and getting better with treatment for upper respiratory tract infection including with inhalers and antibiotic All other systems reviewed and are negative.   Prior CV studies:   The following studies were reviewed today:  none  Labs/Other Tests and Data Reviewed:    EKG:  No ECG reviewed.  Recent Labs: No results found for requested labs within last 8760 hours.   Recent Lipid Panel No results found for: CHOL, TRIG, HDL, CHOLHDL, LDLCALC, LDLDIRECT  Wt Readings from Last 3 Encounters:  10/28/18 281 lb (127.5 kg)  10/02/11 279 lb 3.2 oz (126.6 kg)  09/28/11 280 lb 12.8 oz (127.4 kg)     Objective:    Vital Signs:  BP (!) 145/85 (BP Location: Left Arm, Patient Position: Sitting, Cuff Size: Normal)   Pulse 74   Ht 5\' 1"  (1.549 m)   Wt 281 lb (127.5 kg)   BMI 53.09 kg/m    VITAL SIGNS:  reviewed  ASSESSMENT & PLAN:    1. Shortness of breath: Patient is being treated for upper respiratory tract infection and feeling better.  She denies any history of fever at this time.  She is completed a course of antibiotic.  She is using inhalers.  I am happy that she is feeling better.  I told her that we will need to evaluate her further once these situations are stable.  She denies any orthopnea or any such symptoms.  She has no significant shortness of breath on exertion.  She has mild pedal edema which occurs occasionally.  She mentions to me that it is not the case at this time. 2. Essential hypertension: Her blood pressure stable 3. Mixed dyslipidemia: This is followed by primary care physician. 4. I would like to see her back in the next 8 to 10 days in telemetry visit and reassess her.  At that time I will see if she needs a cardiac evaluation based on her follow-up of the above.  She knows to go to the nearest emergency room for any concerning symptoms.  In view of her respiratory symptoms I prefer not to get her in for any  evaluation such as echocardiogram at this time because of the ongoing viral pandemic.  COVID-19 Education: The signs and symptoms of COVID-19 were discussed with the patient and how to seek care for testing (follow up with PCP or arrange E-visit).  The importance of social distancing was discussed today.  Time:   Today, I have spent 22 minutes with the patient with telehealth technology discussing the above problems.     Medication Adjustments/Labs and Tests Ordered: Current medicines are reviewed at length with the patient today.  Concerns regarding medicines are outlined above.  Tests Ordered: No orders of the defined types were placed in this encounter.   Medication Changes: No orders of the defined types were placed in this encounter.   Disposition:  Follow up in 1 week(s)  Signed, Garwin Brothersajan R Elzina Devera, MD  10/28/2018 4:30 PM    [facility]

## 2018-10-29 NOTE — Patient Instructions (Signed)
Medication Instructions:  Your physician recommends that you continue on your current medications as directed. Please refer to the Current Medication list given to you today.  If you need a refill on your cardiac medications before your next appointment, please call your pharmacy.   Lab work [employer] If you have labs (blood work) drawn today and your tests are completely normal, you will receive your results only by: Marland Kitchen MyChart Message (if you have MyChart) OR . A paper copy in the mail If you have any lab test that is abnormal or we need to change your treatment, we will call you to review the results.  Testing/Procedures: NONE  Follow-Up: At [facility], you and your health needs are our priority.  As part of our continuing mission to provide you with exceptional heart care, we have created designated Provider Care Teams.  These Care Teams include your primary Cardiologist (physician) and Advanced Practice Providers (APPs -  Physician Assistants and Nurse Practitioners) who all work together to provide you with the care you need, when you need it. You will need a follow up appointment in 10 days.

## 2018-11-05 DIAGNOSIS — Z794 Long term (current) use of insulin: Secondary | ICD-10-CM | POA: Diagnosis not present

## 2018-11-05 DIAGNOSIS — E119 Type 2 diabetes mellitus without complications: Secondary | ICD-10-CM | POA: Diagnosis not present

## 2018-11-05 DIAGNOSIS — E785 Hyperlipidemia, unspecified: Secondary | ICD-10-CM | POA: Diagnosis not present

## 2018-11-07 ENCOUNTER — Other Ambulatory Visit: Payer: Self-pay

## 2018-11-07 ENCOUNTER — Encounter: Payer: Self-pay | Admitting: Cardiology

## 2018-11-07 ENCOUNTER — Telehealth (INDEPENDENT_AMBULATORY_CARE_PROVIDER_SITE_OTHER): Payer: Medicare Other | Admitting: Cardiology

## 2018-11-07 VITALS — BP 160/89 | HR 76 | Ht 61.0 in | Wt 281.0 lb

## 2018-11-07 DIAGNOSIS — I10 Essential (primary) hypertension: Secondary | ICD-10-CM | POA: Diagnosis not present

## 2018-11-07 DIAGNOSIS — E088 Diabetes mellitus due to underlying condition with unspecified complications: Secondary | ICD-10-CM

## 2018-11-07 DIAGNOSIS — R0602 Shortness of breath: Secondary | ICD-10-CM

## 2018-11-07 DIAGNOSIS — E782 Mixed hyperlipidemia: Secondary | ICD-10-CM

## 2018-11-07 NOTE — Progress Notes (Signed)
Virtual Visit via Telephone Note   This visit type was conducted due to national recommendations for restrictions regarding the COVID-19 Pandemic (e.g. social distancing) in an effort to limit this patient's exposure and mitigate transmission in our community.  Due to her co-morbid illnesses, this patient is at least at moderate risk for complications without adequate follow up.  This format is felt to be most appropriate for this patient at this time.  The patient did not have access to video technology/had technical difficulties with video requiring transitioning to audio format only (telephone).  All issues noted in this document were discussed and addressed.  No physical exam could be performed with this format.  Please refer to the patient's chart for her  consent to telehealth for [facility].   Evaluation Performed:  Follow-up visit  Date:  11/07/2018   ID:  Holly Spence, DOB Jul 04, 1946, MRN [id number]  Patient Location: Home Provider Location: Home  PCP:  Gabriel CirriMitchell, Hanny Elsberry, DO  Cardiologist:  No primary care provider on file.  Electrophysiologist:  None   Chief Complaint: Shortness of breath and pedal edema  History of Present Illness:    Holly Spence is a 73 y.o. female with past medical history of essential hypertension.  She has been treated for upper respiratory disorder.  She is received antibiotic for infection and is getting better.  She has significant amount of wheezing and uses an inhaler.  She tells me with this treatment with antibiotic and inhaler nebulizer she feels much better.  At the time of my evaluation, the patient is alert awake oriented and in no distress.  The patient does not have symptoms concerning for COVID-19 infection (fever, chills, cough, or new shortness of breath).    Past Medical History:  Diagnosis Date  . Arthritis   . Back pain   . Bruises easily   . Contact lens/glasses fitting   . Cough   . Diabetes mellitus   . Fatigue   .  Hyperlipidemia   . Hypertension   . Poor circulation   . Sinus problem   . Sleep apnea   . SOB (shortness of breath)    Past Surgical History:  Procedure Laterality Date  . CATARACT EXTRACTION     confirm date with patient  . CHOLECYSTECTOMY  1987  . EYE SURGERY  2003   retina  . HERNIA REPAIR    . LAPAROSCOPIC GASTRIC BANDING  03/22/10     Current Meds  Medication Sig  . albuterol (VENTOLIN HFA) 108 (90 Base) MCG/ACT inhaler Inhale 1 puff into the lungs every 6 (six) hours as needed for wheezing or shortness of breath.  Marland Kitchen. aspirin EC 81 MG tablet Take 81 mg by mouth daily.  . carvedilol (COREG) 6.25 MG tablet Take 1 tablet by mouth 2 (two) times a day.  . diclofenac sodium (VOLTAREN) 1 % GEL APPLY 4 GRAMS AS DIRECTED TO KNEES EVERY 6 8 HOURS AS NEEDED FOR PAIN  . insulin NPH-regular Human (70-30) 100 UNIT/ML injection Inject into the skin.  Marland Kitchen. lisinopril (PRINIVIL,ZESTRIL) 10 MG tablet Take 10 mg by mouth daily.   Marland Kitchen. loratadine (CLARITIN) 10 MG tablet Take 10 mg by mouth daily.  Marland Kitchen. lovastatin (MEVACOR) 20 MG tablet Take 1 tablet by mouth daily.  . meloxicam (MOBIC) 15 MG tablet Take 1 tablet by mouth at bedtime.  . montelukast (SINGULAIR) 10 MG tablet Take 1 tablet by mouth daily.  . Probiotic Product (PROBIOTIC-10 PO) Take by mouth.  Marland Kitchen. rOPINIRole (  REQUIP) 1 MG tablet Take 1 tablet by mouth daily.  . traMADol (ULTRAM) 50 MG tablet Take 2 tablets by mouth 2 (two) times a day.     Allergies:   Penicillins and Erythromycin   Social History   Tobacco Use  . Smoking status: Never Smoker  . Smokeless tobacco: Never Used  Substance Use Topics  . Alcohol use: No  . Drug use: No     Family Hx: The patient's family history includes Arthritis in her mother; Diabetes in her father and sister; Heart disease in her father and mother; Hypertension in her mother.  ROS:   Please see the history of present illness.    As mentioned above.  No chest pain orthopnea or PND All other  systems reviewed and are negative.   Prior CV studies:   The following studies were reviewed today:  None  Labs/Other Tests and Data Reviewed:    EKG:  No ECG reviewed.  Recent Labs: No results found for requested labs within last 8760 hours.   Recent Lipid Panel No results found for: CHOL, TRIG, HDL, CHOLHDL, LDLCALC, LDLDIRECT  Wt Readings from Last 3 Encounters:  11/07/18 281 lb (127.5 kg)  10/28/18 281 lb (127.5 kg)  10/02/11 279 lb 3.2 oz (126.6 kg)     Objective:    Vital Signs:  BP (!) 160/89 (BP Location: Left Arm, Patient Position: Sitting, Cuff Size: Normal)   Pulse 76   Ht 5\' 1"  (1.549 m)   Wt 281 lb (127.5 kg)   BMI 53.09 kg/m    VITAL SIGNS:  reviewed  ASSESSMENT & PLAN:    1. Shortness of breath: Patient mentions to me that this has resolved significantly since upper respiratory treated and she is happy about it.  Pedal edema also has gotten better. 2. Patient is recovering from a respiratory tract infection and we will monitor her closely.  She will have an echocardiogram done in the next 3 to 4 weeks.  Hopefully by that time all her upper respiratory tract issues have subsided follow-up appointment.  She knows to go to the nearest emergency room for any concerning symptoms.  Her blood pressure is stable.  COVID-19 Education: The signs and symptoms of COVID-19 were discussed with the patient and how to seek care for testing (follow up with PCP or arrange E-visit).  The importance of social distancing was discussed today.  Time:   Today, I have spent 16 minutes with the patient with telehealth technology discussing the above problems.     Medication Adjustments/Labs and Tests Ordered: Current medicines are reviewed at length with the patient today.  Concerns regarding medicines are outlined above.   Tests Ordered: No orders of the defined types were placed in this encounter.   Medication Changes: No orders of the defined types were placed in this  encounter.   Disposition:  Follow up in 4 month(s)  Signed, Garwin Brothers, MD  11/07/2018 2:37 PM    [facility]

## 2018-11-07 NOTE — Patient Instructions (Signed)
Medication Instructions:  NONE If you need a refill on your cardiac medications before your next appointment, please call your pharmacy.   Lab work: NONE If you have labs (blood work) drawn today and your tests are completely normal, you will receive your results only by: Marland Kitchen MyChart Message (if you have MyChart) OR . A paper copy in the mail If you have any lab test that is abnormal or we need to change your treatment, we will call you to review the results.  Testing/Procedures: Your physician has requested that you have an echocardiogram. Echocardiography is a painless test that uses sound waves to create images of your heart. It provides your doctor with information about the size and shape of your heart and how well your heart's chambers and valves are working. This procedure takes approximately one hour. There are no restrictions for this procedure.    Follow-Up: At [facility], you and your health needs are our priority.  As part of our continuing mission to provide you with exceptional heart care, we have created designated Provider Care Teams.  These Care Teams include your primary Cardiologist (physician) and Advanced Practice Providers (APPs -  Physician Assistants and Nurse Practitioners) who all work together to provide you with the care you need, when you need it. . You will need a follow up appointment in 4 months  Any Other Special Instructions Will Be Listed Below  Echocardiogram An echocardiogram is a procedure that uses painless sound waves (ultrasound) to produce an image of the heart. Images from an echocardiogram can provide important information about:  Signs of coronary artery disease (CAD).  Aneurysm detection. An aneurysm is a weak or damaged part of an artery wall that bulges out from the normal force of blood pumping through the body.  Heart size and shape. Changes in the size or shape of the heart can be associated with certain conditions, including heart  failure, aneurysm, and CAD.  Heart muscle function.  Heart valve function.  Signs of a past heart attack.  Fluid buildup around the heart.  Thickening of the heart muscle.  A tumor or infectious growth around the heart valves. Tell a health care provider about:  Any allergies you have.  All medicines you are taking, including vitamins, herbs, eye drops, creams, and over-the-counter medicines.  Any blood disorders you have.  Any surgeries you have had.  Any medical conditions you have.  Whether you are pregnant or may be pregnant. What are the risks? Generally, this is a safe procedure. However, problems may occur, including:  Allergic reaction to dye (contrast) that may be used during the procedure. What happens before the procedure? No specific preparation is needed. You may eat and drink normally. What happens during the procedure?   An IV tube may be inserted into one of your veins.  You may receive contrast through this tube. A contrast is an injection that improves the quality of the pictures from your heart.  A gel will be applied to your chest.  A wand-like tool (transducer) will be moved over your chest. The gel will help to transmit the sound waves from the transducer.  The sound waves will harmlessly bounce off of your heart to allow the heart images to be captured in real-time motion. The images will be recorded on a computer. The procedure may vary among health care providers and hospitals. What happens after the procedure?  You may return to your normal, everyday life, including diet, activities, and medicines, unless  your health care provider tells you not to do that. Summary  An echocardiogram is a procedure that uses painless sound waves (ultrasound) to produce an image of the heart.  Images from an echocardiogram can provide important information about the size and shape of your heart, heart muscle function, heart valve function, and fluid buildup  around your heart.  You do not need to do anything to prepare before this procedure. You may eat and drink normally.  After the echocardiogram is completed, you may return to your normal, everyday life, unless your health care provider tells you not to do that. This information is not intended to replace advice given to you by your health care provider. Make sure you discuss any questions you have with your health care provider. Document Released: 06/23/2000 Document Revised: 07/29/2016 Document Reviewed: 07/29/2016 Elsevier Interactive Patient Education  2019 [employer]

## 2018-11-07 NOTE — Addendum Note (Signed)
Addended by: Pamala Hurry on: 11/07/2018 04:03 PM   Modules accepted: Orders

## 2018-11-29 ENCOUNTER — Other Ambulatory Visit: Payer: Medicare Other

## 2018-12-05 ENCOUNTER — Ambulatory Visit (INDEPENDENT_AMBULATORY_CARE_PROVIDER_SITE_OTHER): Payer: Medicare Other

## 2018-12-05 ENCOUNTER — Other Ambulatory Visit: Payer: Self-pay

## 2018-12-05 DIAGNOSIS — R0602 Shortness of breath: Secondary | ICD-10-CM | POA: Diagnosis not present

## 2018-12-05 NOTE — Progress Notes (Signed)
Complete echocardiogram has been performed.  Jimmy Addysyn Fern RDCS, RVT 

## 2018-12-09 ENCOUNTER — Telehealth: Payer: Self-pay

## 2018-12-09 DIAGNOSIS — I10 Essential (primary) hypertension: Secondary | ICD-10-CM

## 2018-12-09 DIAGNOSIS — I77810 Thoracic aortic ectasia: Secondary | ICD-10-CM | POA: Insufficient documentation

## 2018-12-09 DIAGNOSIS — J45909 Unspecified asthma, uncomplicated: Secondary | ICD-10-CM | POA: Diagnosis not present

## 2018-12-09 HISTORY — DX: Thoracic aortic ectasia: I77.810

## 2018-12-09 NOTE — Telephone Encounter (Signed)
Called patient to discuss echo finding and she is ok with Ct aorta. Request for recent labs sent to Dr.Potts for pre procedure review.Copy of echo results sent to Dr. Windle Guard for review.

## 2018-12-09 NOTE — Telephone Encounter (Signed)
-----   Message from Garwin Brothers, MD sent at 12/05/2018  4:46 PM EDT ----- The results of the study is unremarkable.  Aortic root is mildly dilated and ascending aorta.  I would like her to get a CT scan of the chest with contrast.  Make sure her renal function is normal before this.  Please inform patient. I will discuss in detail at next appointment. Cc  primary care/referring physician Garwin Brothers, MD 12/05/2018 4:46 PM

## 2018-12-11 ENCOUNTER — Telehealth: Payer: Self-pay | Admitting: Cardiology

## 2018-12-11 NOTE — Telephone Encounter (Signed)
Wants her CT done at [facility]

## 2018-12-19 DIAGNOSIS — Z9049 Acquired absence of other specified parts of digestive tract: Secondary | ICD-10-CM | POA: Diagnosis not present

## 2018-12-19 DIAGNOSIS — I700 Atherosclerosis of aorta: Secondary | ICD-10-CM | POA: Diagnosis not present

## 2018-12-19 DIAGNOSIS — I2510 Atherosclerotic heart disease of native coronary artery without angina pectoris: Secondary | ICD-10-CM | POA: Diagnosis not present

## 2018-12-19 DIAGNOSIS — R0602 Shortness of breath: Secondary | ICD-10-CM | POA: Diagnosis not present

## 2018-12-26 ENCOUNTER — Telehealth: Payer: Self-pay | Admitting: *Deleted

## 2018-12-26 DIAGNOSIS — R931 Abnormal findings on diagnostic imaging of heart and coronary circulation: Secondary | ICD-10-CM

## 2018-12-26 NOTE — Addendum Note (Signed)
Addended by: Beckey Rutter on: 12/26/2018 11:54 AM   Modules accepted: Orders

## 2018-12-26 NOTE — Telephone Encounter (Signed)
Pt wondering when she will get results of cat scan. Please advise

## 2018-12-26 NOTE — Telephone Encounter (Signed)
Patient calling for result of Cardiac CT scan done 12/19/18 at [facility] Unable to access record. Told pt Dr. Julien Nordmann office will call her back with result.

## 2018-12-26 NOTE — Telephone Encounter (Signed)
Per Dr. Docia Furl request, patient needs lexiscan to evaluate for CAD. She is ok with test and message sent to L. Welch to call and schedule patient. RN sent procedure instructions via mail for patient review.

## 2018-12-31 DIAGNOSIS — N390 Urinary tract infection, site not specified: Secondary | ICD-10-CM | POA: Diagnosis not present

## 2018-12-31 DIAGNOSIS — R530 Neoplastic (malignant) related fatigue: Secondary | ICD-10-CM | POA: Diagnosis not present

## 2018-12-31 DIAGNOSIS — R350 Frequency of micturition: Secondary | ICD-10-CM | POA: Diagnosis not present

## 2019-01-02 DIAGNOSIS — J45909 Unspecified asthma, uncomplicated: Secondary | ICD-10-CM | POA: Diagnosis not present

## 2019-01-08 DIAGNOSIS — R300 Dysuria: Secondary | ICD-10-CM | POA: Diagnosis not present

## 2019-01-09 DIAGNOSIS — R300 Dysuria: Secondary | ICD-10-CM | POA: Diagnosis not present

## 2019-01-15 DIAGNOSIS — A084 Viral intestinal infection, unspecified: Secondary | ICD-10-CM | POA: Diagnosis not present

## 2019-01-21 DIAGNOSIS — R109 Unspecified abdominal pain: Secondary | ICD-10-CM | POA: Diagnosis not present

## 2019-01-29 DIAGNOSIS — E1142 Type 2 diabetes mellitus with diabetic polyneuropathy: Secondary | ICD-10-CM | POA: Diagnosis not present

## 2019-01-29 DIAGNOSIS — E559 Vitamin D deficiency, unspecified: Secondary | ICD-10-CM | POA: Diagnosis not present

## 2019-01-29 DIAGNOSIS — G8929 Other chronic pain: Secondary | ICD-10-CM | POA: Diagnosis not present

## 2019-01-29 DIAGNOSIS — I10 Essential (primary) hypertension: Secondary | ICD-10-CM | POA: Diagnosis not present

## 2019-01-29 DIAGNOSIS — E785 Hyperlipidemia, unspecified: Secondary | ICD-10-CM | POA: Diagnosis not present

## 2019-01-29 DIAGNOSIS — R109 Unspecified abdominal pain: Secondary | ICD-10-CM | POA: Diagnosis not present

## 2019-01-30 DIAGNOSIS — R109 Unspecified abdominal pain: Secondary | ICD-10-CM | POA: Diagnosis not present

## 2019-02-03 ENCOUNTER — Other Ambulatory Visit: Payer: Self-pay

## 2019-02-03 NOTE — Patient Outreach (Signed)
[facility]) Care Management  02/03/2019  Holly Spence [birth year] [id number]   Telephone Screen  Referral Date: 02/03/2019 Referral Source: [facility] Dept. Referral Reason: " back and muscle pain been to ER twice in 2wks, lost 12lbs in 2wks due to not being able to eat, drinking Ensure, has not had insulin in 2 days and unable to take any of her meds, needs transportation assistance" Insurance: [facility] Medicare   Incoming call from patient returning RN CM call. Patient reports she is not feeling well and did not wish to talk at length. She states that she still does not know what is causing her pain. She is taking Gabapentin with some relief. She goes Wednesday for urology appt. Patient reports that they think she might have kidney stone. She reports decreased appetite. She is drinking nutritional supplements. Blood sugars ranging in the 70s-190s. She denies any n&v. Patient reports she thinks she might be slightly constipated as she has not had BM in two days. She took some mineral oil late last night with no results yet. RN CM offered suggestions to help and patient will follow up. She voices that she is taking about 10 meds and fills her own med planner. She denies any recent falls. States she has supportive spouse int he home who assists her as needed and takes her to appts. Patient denies any RN CM needs or concerns at this time. She was agreeable to RN CM calling her within a week for follow up call.    Plan: RN CM will make outreach attempt to patient within one week.   Enzo Montgomery, RN,BSN,CCM [place] Management Telephonic Care Management Coordinator Direct Phone: [phone] Toll Free: [phone] Fax: [phone]

## 2019-02-03 NOTE — Patient Outreach (Signed)
[place] [facility]) Care Management  02/03/2019  Holly Spence [birth year] [id number]   Telephone Screen  Referral Date: 02/03/2019 Referral Source: [facility] Referral Reason: " back and muscle pain been to ER twice in 2wks, lost 12lbs in 2wks due to not being able to eat, drinking Ensure, has not had insulin in 2 days and unable to take any of her meds, needs transportation assistance" Insurance: [facility] Medicare   Outreach attempt # 1 to patient. No answer. RN CM left HIPAA compliant voicemail message along with contact info.       Plan: RN CM will make outreach attempt to patient within 3-4 business days. RN CM will send unsuccessful outreach letter to patient.    Enzo Montgomery, RN,BSN,CCM [place] Management Telephonic Care Management Coordinator Direct Phone: [phone] Toll Free: [phone] Fax: [phone]

## 2019-02-05 DIAGNOSIS — Z79899 Other long term (current) drug therapy: Secondary | ICD-10-CM | POA: Diagnosis not present

## 2019-02-05 DIAGNOSIS — N390 Urinary tract infection, site not specified: Secondary | ICD-10-CM | POA: Diagnosis not present

## 2019-02-05 DIAGNOSIS — R1011 Right upper quadrant pain: Secondary | ICD-10-CM | POA: Diagnosis not present

## 2019-02-05 DIAGNOSIS — K5792 Diverticulitis of intestine, part unspecified, without perforation or abscess without bleeding: Secondary | ICD-10-CM | POA: Diagnosis not present

## 2019-02-05 DIAGNOSIS — R1031 Right lower quadrant pain: Secondary | ICD-10-CM | POA: Diagnosis not present

## 2019-02-06 DIAGNOSIS — R109 Unspecified abdominal pain: Secondary | ICD-10-CM | POA: Diagnosis not present

## 2019-02-07 ENCOUNTER — Inpatient Hospital Stay
Admission: AD | Admit: 2019-02-07 | Payer: Medicare Other | Source: Other Acute Inpatient Hospital | Admitting: Internal Medicine

## 2019-02-07 DIAGNOSIS — N281 Cyst of kidney, acquired: Secondary | ICD-10-CM | POA: Diagnosis not present

## 2019-02-07 DIAGNOSIS — E11649 Type 2 diabetes mellitus with hypoglycemia without coma: Secondary | ICD-10-CM | POA: Diagnosis not present

## 2019-02-07 DIAGNOSIS — E162 Hypoglycemia, unspecified: Secondary | ICD-10-CM | POA: Diagnosis not present

## 2019-02-07 DIAGNOSIS — D649 Anemia, unspecified: Secondary | ICD-10-CM | POA: Diagnosis not present

## 2019-02-07 DIAGNOSIS — K922 Gastrointestinal hemorrhage, unspecified: Secondary | ICD-10-CM | POA: Diagnosis not present

## 2019-02-07 DIAGNOSIS — K921 Melena: Secondary | ICD-10-CM | POA: Diagnosis not present

## 2019-02-07 DIAGNOSIS — R109 Unspecified abdominal pain: Secondary | ICD-10-CM | POA: Diagnosis not present

## 2019-02-07 DIAGNOSIS — I959 Hypotension, unspecified: Secondary | ICD-10-CM | POA: Diagnosis not present

## 2019-02-08 DIAGNOSIS — F32A Depression, unspecified: Secondary | ICD-10-CM | POA: Insufficient documentation

## 2019-02-08 DIAGNOSIS — Z880 Allergy status to penicillin: Secondary | ICD-10-CM | POA: Diagnosis not present

## 2019-02-08 DIAGNOSIS — R110 Nausea: Secondary | ICD-10-CM | POA: Diagnosis not present

## 2019-02-08 DIAGNOSIS — K269 Duodenal ulcer, unspecified as acute or chronic, without hemorrhage or perforation: Secondary | ICD-10-CM | POA: Insufficient documentation

## 2019-02-08 DIAGNOSIS — N2889 Other specified disorders of kidney and ureter: Secondary | ICD-10-CM | POA: Insufficient documentation

## 2019-02-08 DIAGNOSIS — N281 Cyst of kidney, acquired: Secondary | ICD-10-CM | POA: Diagnosis not present

## 2019-02-08 DIAGNOSIS — K2900 Acute gastritis without bleeding: Secondary | ICD-10-CM | POA: Diagnosis not present

## 2019-02-08 DIAGNOSIS — K922 Gastrointestinal hemorrhage, unspecified: Secondary | ICD-10-CM | POA: Diagnosis not present

## 2019-02-08 DIAGNOSIS — J45909 Unspecified asthma, uncomplicated: Secondary | ICD-10-CM | POA: Insufficient documentation

## 2019-02-08 DIAGNOSIS — E785 Hyperlipidemia, unspecified: Secondary | ICD-10-CM | POA: Diagnosis not present

## 2019-02-08 DIAGNOSIS — E1140 Type 2 diabetes mellitus with diabetic neuropathy, unspecified: Secondary | ICD-10-CM | POA: Diagnosis not present

## 2019-02-08 DIAGNOSIS — K921 Melena: Secondary | ICD-10-CM

## 2019-02-08 DIAGNOSIS — D62 Acute posthemorrhagic anemia: Secondary | ICD-10-CM | POA: Diagnosis not present

## 2019-02-08 DIAGNOSIS — N289 Disorder of kidney and ureter, unspecified: Secondary | ICD-10-CM | POA: Diagnosis not present

## 2019-02-08 DIAGNOSIS — G2581 Restless legs syndrome: Secondary | ICD-10-CM | POA: Insufficient documentation

## 2019-02-08 DIAGNOSIS — Z882 Allergy status to sulfonamides status: Secondary | ICD-10-CM | POA: Diagnosis not present

## 2019-02-08 DIAGNOSIS — I10 Essential (primary) hypertension: Secondary | ICD-10-CM | POA: Diagnosis not present

## 2019-02-08 DIAGNOSIS — Z794 Long term (current) use of insulin: Secondary | ICD-10-CM | POA: Diagnosis not present

## 2019-02-08 DIAGNOSIS — K2960 Other gastritis without bleeding: Secondary | ICD-10-CM | POA: Diagnosis not present

## 2019-02-08 DIAGNOSIS — Z7982 Long term (current) use of aspirin: Secondary | ICD-10-CM | POA: Diagnosis not present

## 2019-02-08 DIAGNOSIS — Z79899 Other long term (current) drug therapy: Secondary | ICD-10-CM | POA: Diagnosis not present

## 2019-02-08 DIAGNOSIS — Z87891 Personal history of nicotine dependence: Secondary | ICD-10-CM | POA: Diagnosis not present

## 2019-02-08 HISTORY — DX: Restless legs syndrome: G25.81

## 2019-02-08 HISTORY — DX: Duodenal ulcer, unspecified as acute or chronic, without hemorrhage or perforation: K26.9

## 2019-02-08 HISTORY — DX: Depression, unspecified: F32.A

## 2019-02-08 HISTORY — DX: Other specified disorders of kidney and ureter: N28.89

## 2019-02-08 HISTORY — DX: Unspecified asthma, uncomplicated: J45.909

## 2019-02-08 HISTORY — DX: Melena: K92.1

## 2019-02-09 DIAGNOSIS — E785 Hyperlipidemia, unspecified: Secondary | ICD-10-CM | POA: Diagnosis not present

## 2019-02-09 DIAGNOSIS — N2889 Other specified disorders of kidney and ureter: Secondary | ICD-10-CM | POA: Diagnosis not present

## 2019-02-09 DIAGNOSIS — K921 Melena: Secondary | ICD-10-CM | POA: Diagnosis not present

## 2019-02-09 DIAGNOSIS — I10 Essential (primary) hypertension: Secondary | ICD-10-CM | POA: Diagnosis not present

## 2019-02-09 DIAGNOSIS — K269 Duodenal ulcer, unspecified as acute or chronic, without hemorrhage or perforation: Secondary | ICD-10-CM | POA: Diagnosis not present

## 2019-02-09 DIAGNOSIS — Z791 Long term (current) use of non-steroidal anti-inflammatories (NSAID): Secondary | ICD-10-CM | POA: Diagnosis not present

## 2019-02-09 DIAGNOSIS — Z79899 Other long term (current) drug therapy: Secondary | ICD-10-CM | POA: Diagnosis not present

## 2019-02-09 DIAGNOSIS — Z794 Long term (current) use of insulin: Secondary | ICD-10-CM | POA: Diagnosis not present

## 2019-02-09 DIAGNOSIS — Z880 Allergy status to penicillin: Secondary | ICD-10-CM | POA: Diagnosis not present

## 2019-02-09 DIAGNOSIS — K2900 Acute gastritis without bleeding: Secondary | ICD-10-CM | POA: Diagnosis not present

## 2019-02-09 DIAGNOSIS — J45909 Unspecified asthma, uncomplicated: Secondary | ICD-10-CM | POA: Diagnosis not present

## 2019-02-09 DIAGNOSIS — N289 Disorder of kidney and ureter, unspecified: Secondary | ICD-10-CM | POA: Diagnosis not present

## 2019-02-09 DIAGNOSIS — Z882 Allergy status to sulfonamides status: Secondary | ICD-10-CM | POA: Diagnosis not present

## 2019-02-09 DIAGNOSIS — Z7982 Long term (current) use of aspirin: Secondary | ICD-10-CM | POA: Diagnosis not present

## 2019-02-09 DIAGNOSIS — E1165 Type 2 diabetes mellitus with hyperglycemia: Secondary | ICD-10-CM | POA: Diagnosis not present

## 2019-02-09 DIAGNOSIS — Z87891 Personal history of nicotine dependence: Secondary | ICD-10-CM | POA: Diagnosis not present

## 2019-02-09 DIAGNOSIS — E1140 Type 2 diabetes mellitus with diabetic neuropathy, unspecified: Secondary | ICD-10-CM | POA: Diagnosis not present

## 2019-02-09 DIAGNOSIS — E118 Type 2 diabetes mellitus with unspecified complications: Secondary | ICD-10-CM | POA: Diagnosis not present

## 2019-02-09 DIAGNOSIS — D62 Acute posthemorrhagic anemia: Secondary | ICD-10-CM | POA: Diagnosis not present

## 2019-02-10 ENCOUNTER — Other Ambulatory Visit: Payer: Self-pay

## 2019-02-10 DIAGNOSIS — E1165 Type 2 diabetes mellitus with hyperglycemia: Secondary | ICD-10-CM | POA: Diagnosis not present

## 2019-02-10 DIAGNOSIS — K2900 Acute gastritis without bleeding: Secondary | ICD-10-CM | POA: Diagnosis not present

## 2019-02-10 DIAGNOSIS — K2960 Other gastritis without bleeding: Secondary | ICD-10-CM | POA: Diagnosis not present

## 2019-02-10 DIAGNOSIS — E1140 Type 2 diabetes mellitus with diabetic neuropathy, unspecified: Secondary | ICD-10-CM | POA: Diagnosis not present

## 2019-02-10 DIAGNOSIS — Z87891 Personal history of nicotine dependence: Secondary | ICD-10-CM | POA: Diagnosis not present

## 2019-02-10 DIAGNOSIS — N289 Disorder of kidney and ureter, unspecified: Secondary | ICD-10-CM | POA: Diagnosis not present

## 2019-02-10 DIAGNOSIS — Z880 Allergy status to penicillin: Secondary | ICD-10-CM | POA: Diagnosis not present

## 2019-02-10 DIAGNOSIS — R05 Cough: Secondary | ICD-10-CM | POA: Diagnosis not present

## 2019-02-10 DIAGNOSIS — N2889 Other specified disorders of kidney and ureter: Secondary | ICD-10-CM | POA: Diagnosis not present

## 2019-02-10 DIAGNOSIS — Z79899 Other long term (current) drug therapy: Secondary | ICD-10-CM | POA: Diagnosis not present

## 2019-02-10 DIAGNOSIS — K269 Duodenal ulcer, unspecified as acute or chronic, without hemorrhage or perforation: Secondary | ICD-10-CM | POA: Diagnosis not present

## 2019-02-10 DIAGNOSIS — Z794 Long term (current) use of insulin: Secondary | ICD-10-CM | POA: Diagnosis not present

## 2019-02-10 DIAGNOSIS — K921 Melena: Secondary | ICD-10-CM | POA: Diagnosis not present

## 2019-02-10 DIAGNOSIS — E118 Type 2 diabetes mellitus with unspecified complications: Secondary | ICD-10-CM | POA: Diagnosis not present

## 2019-02-10 DIAGNOSIS — I10 Essential (primary) hypertension: Secondary | ICD-10-CM | POA: Diagnosis not present

## 2019-02-10 DIAGNOSIS — E785 Hyperlipidemia, unspecified: Secondary | ICD-10-CM | POA: Diagnosis not present

## 2019-02-10 DIAGNOSIS — J45909 Unspecified asthma, uncomplicated: Secondary | ICD-10-CM | POA: Diagnosis not present

## 2019-02-10 DIAGNOSIS — Z7982 Long term (current) use of aspirin: Secondary | ICD-10-CM | POA: Diagnosis not present

## 2019-02-10 DIAGNOSIS — Z882 Allergy status to sulfonamides status: Secondary | ICD-10-CM | POA: Diagnosis not present

## 2019-02-10 DIAGNOSIS — D62 Acute posthemorrhagic anemia: Secondary | ICD-10-CM | POA: Diagnosis not present

## 2019-02-10 NOTE — Patient Outreach (Signed)
[facility]) Care Management  02/10/2019  Holly Spence [birth year] [id number]    Telephone Screen  Referral Date:02/03/2019 Referral Source:[facility] Dept. Referral Reason:" back and muscle pain been to ER twice in 2wks, lost 12lbs in 2wks due to not being able to eat, drinking Ensure, has not had insulin in 2 days and unable to take any of her meds, needs transportation assistance" Insurance:[employer] Medicare   Follow up call attempt to check back on patient. No answer at present. RN CM left HIPAA compliant voicemail message along with contact info.     Plan: RN CM will make outreach attempt to patient within 3-4 business days.   Enzo Montgomery, RN,BSN,CCM [place] Management Telephonic Care Management Coordinator Direct Phone: [phone] Toll Free: [phone] Fax: [phone]

## 2019-02-11 ENCOUNTER — Other Ambulatory Visit: Payer: Self-pay

## 2019-02-11 DIAGNOSIS — E785 Hyperlipidemia, unspecified: Secondary | ICD-10-CM | POA: Diagnosis not present

## 2019-02-11 DIAGNOSIS — E119 Type 2 diabetes mellitus without complications: Secondary | ICD-10-CM | POA: Diagnosis not present

## 2019-02-11 DIAGNOSIS — Z794 Long term (current) use of insulin: Secondary | ICD-10-CM | POA: Diagnosis not present

## 2019-02-11 NOTE — Patient Outreach (Signed)
[facility] [facility]) Care Management  02/11/2019  Holly Spence [birth year] [id number]     Telephone Screen/ Follow Up Call  Referral Date:02/03/2019 Referral Source:[facility] Dept. Referral Reason:" back and muscle pain been to ER twice in 2wks, lost 12lbs in 2wks due to not being able to eat, drinking Ensure, has not had insulin in 2 days and unable to take any of her meds, needs transportation assistance" Insurance:[employer] Medicare   Voicemail message received from patient. Return call placed to patient. Spoke with patient who voices that she does not feel well this morning and requested brief call. She shares that last week she had a bleeding ulcer and had to have that taken care of. She states she was at [facility]. She reports that she has multiple things going on with her right now. RN CM again offered [facility] support. Patient does not feel like she needs services as she reports "not much can be done over the phone." She continues to voice that she has supportive spouse and daughter who are in the home assisting her as needed. She has [facility] contact info and is aware that she can call for any future needs or concerns.      Plan: RN CM will close case at this time.   Enzo Montgomery, RN,BSN,CCM [place] Management Telephonic Care Management Coordinator Direct Phone: [phone] Toll Free: [phone] Fax: [phone]

## 2019-02-12 ENCOUNTER — Ambulatory Visit: Payer: Self-pay

## 2019-02-12 ENCOUNTER — Telehealth: Payer: Self-pay | Admitting: Cardiology

## 2019-02-12 NOTE — Telephone Encounter (Signed)
Patient was in hospital from 07/31-02/10/19. Had a cyst on right kidney and bleeding in bowels. Patient discontinued coreg at [facility]. She is on protonix for ulcer. She does not feel she should do lexi right now due to her weaken state. Patient requesting lexi be rescheduled for late Sept/early oct 2020.

## 2019-02-12 NOTE — Telephone Encounter (Signed)
Patient was inpatient at [facility] and they think she should not take nuclear test. Please advise.

## 2019-02-13 NOTE — Telephone Encounter (Signed)
ok 

## 2019-02-18 DIAGNOSIS — R600 Localized edema: Secondary | ICD-10-CM | POA: Diagnosis not present

## 2019-02-18 DIAGNOSIS — D649 Anemia, unspecified: Secondary | ICD-10-CM | POA: Diagnosis not present

## 2019-02-18 DIAGNOSIS — Z09 Encounter for follow-up examination after completed treatment for conditions other than malignant neoplasm: Secondary | ICD-10-CM | POA: Diagnosis not present

## 2019-02-18 DIAGNOSIS — N281 Cyst of kidney, acquired: Secondary | ICD-10-CM | POA: Diagnosis not present

## 2019-02-18 DIAGNOSIS — K263 Acute duodenal ulcer without hemorrhage or perforation: Secondary | ICD-10-CM | POA: Diagnosis not present

## 2019-02-20 ENCOUNTER — Encounter: Payer: Self-pay | Admitting: Cardiology

## 2019-02-20 ENCOUNTER — Other Ambulatory Visit: Payer: Self-pay

## 2019-02-20 ENCOUNTER — Ambulatory Visit (INDEPENDENT_AMBULATORY_CARE_PROVIDER_SITE_OTHER): Payer: Medicare Other | Admitting: Cardiology

## 2019-02-20 VITALS — BP 108/48 | HR 65 | Ht 63.0 in | Wt 291.0 lb

## 2019-02-20 DIAGNOSIS — E782 Mixed hyperlipidemia: Secondary | ICD-10-CM

## 2019-02-20 DIAGNOSIS — I10 Essential (primary) hypertension: Secondary | ICD-10-CM | POA: Diagnosis not present

## 2019-02-20 DIAGNOSIS — I2510 Atherosclerotic heart disease of native coronary artery without angina pectoris: Secondary | ICD-10-CM | POA: Diagnosis not present

## 2019-02-20 DIAGNOSIS — E088 Diabetes mellitus due to underlying condition with unspecified complications: Secondary | ICD-10-CM

## 2019-02-20 DIAGNOSIS — I77810 Thoracic aortic ectasia: Secondary | ICD-10-CM

## 2019-02-20 NOTE — Progress Notes (Signed)
Cardiology Office Note:    Date:  02/20/2019   ID:  Holly Spence, DOB 01/16/1946, MRN [id number]  PCP:  Nicoletta Dress, MD  Cardiologist:  Jenean Lindau, MD   Referring MD: Nicoletta Dress, MD    ASSESSMENT:    1. Benign essential hypertension   2. Ascending aorta dilation (HCC)   3. Diabetes mellitus due to underlying condition with unspecified complications ([place])   4. Mixed hyperlipidemia    PLAN:    In order of problems listed above:  1. Coronary artery disease: Coronary atherosclerosis was significant found on CT scan.  Patient asymptomatic at this time and living a sedentary lifestyle and she will continue current medications. 2. History of recent GI bleed for which she has been stopped on aspirin or any NSAIDs.  This is appropriate. 3. Essential hypertension: Blood pressure stable 4. History of congestive heart failure: Echocardiogram revealed preserved systolic function I discussed this with the patient at length 5. Patient will be seen in follow-up appointment in 3 months or earlier if the patient has any concerns.  She will have a stress test before she sees me in follow-up appointment.    Medication Adjustments/Labs and Tests Ordered: Current medicines are reviewed at length with the patient today.  Concerns regarding medicines are outlined above.  No orders of the defined types were placed in this encounter.  No orders of the defined types were placed in this encounter.    No chief complaint on file.    History of Present Illness:    Holly Spence is a 73 y.o. female.  Patient has history of essential hypertension, diabetes mellitus, morbid obesity and coronary atherosclerosis found on CT scanning.  She was in the hospital for bleeding and treated and released.  Subsequently she is done well.  No chest pain orthopnea or PND.  CT scan has revealed extensive coronary calcification.  At the time of my evaluation, the patient is alert awake oriented and  in no distress.  Past Medical History:  Diagnosis Date  . Arthritis   . Back pain   . Bruises easily   . Contact lens/glasses fitting   . Cough   . Diabetes mellitus   . Fatigue   . Hyperlipidemia   . Hypertension   . Poor circulation   . Sinus problem   . Sleep apnea   . SOB (shortness of breath)     Past Surgical History:  Procedure Laterality Date  . CATARACT EXTRACTION     confirm date with patient  . CHOLECYSTECTOMY  1987  . EYE SURGERY  2003   retina  . HERNIA REPAIR    . LAPAROSCOPIC GASTRIC BANDING  03/22/10    Current Medications: No outpatient medications have been marked as taking for the 02/20/19 encounter (Office Visit) with Lori Popowski, Reita Cliche, MD.     Allergies:   Penicillins, Sulfa antibiotics, Erythromycin, and Ciprofloxacin hcl   Social History   Socioeconomic History  . Marital status: Married    Spouse name: Not on file  . Number of children: Not on file  . Years of education: Not on file  . Highest education level: Not on file  Occupational History  . Not on file  Social Needs  . Financial resource strain: Not on file  . Food insecurity    Worry: Not on file    Inability: Not on file  . Transportation needs    Medical: Not on file    Non-medical: Not on  file  Tobacco Use  . Smoking status: Never Smoker  . Smokeless tobacco: Never Used  Substance and Sexual Activity  . Alcohol use: No  . Drug use: No  . Sexual activity: Not on file  Lifestyle  . Physical activity    Days per week: Not on file    Minutes per session: Not on file  . Stress: Not on file  Relationships  . Social Musicianconnections    Talks on phone: Not on file    Gets together: Not on file    Attends religious service: Not on file    Active member of club or organization: Not on file    Attends meetings of clubs or organizations: Not on file    Relationship status: Not on file  Other Topics Concern  . Not on file  Social History Narrative  . Not on file     Family  History: The patient's family history includes Arthritis in her mother; Diabetes in her father and sister; Heart disease in her father and mother; Hypertension in her mother.  ROS:   Please see the history of present illness.    All other systems reviewed and are negative.  EKGs/Labs/Other Studies Reviewed:    The following studies were reviewed today: I [facility].  EKG done today reveals sinus rhythm with Mobitz 1 block.   Recent Labs: No results found for requested labs within last 8760 hours.  Recent Lipid Panel No results found for: CHOL, TRIG, HDL, CHOLHDL, VLDL, LDLCALC, LDLDIRECT  Physical Exam:    VS:  BP (!) 108/48 (BP Location: Left Arm, Patient Position: Sitting, Cuff Size: Normal)   Pulse 65   Ht 5\' 3"  (1.6 m)   Wt 291 lb (132 kg)   SpO2 100%   BMI 51.55 kg/m     Wt Readings from Last 3 Encounters:  02/20/19 291 lb (132 kg)  11/07/18 281 lb (127.5 kg)  10/28/18 281 lb (127.5 kg)     GEN: Patient is in no acute distress HEENT: Normal NECK: No JVD; No carotid bruits LYMPHATICS: No lymphadenopathy CARDIAC: Hear sounds regular, 2/6 systolic murmur at the apex. RESPIRATORY:  Clear to auscultation without rales, wheezing or rhonchi  ABDOMEN: Soft, non-tender, non-distended MUSCULOSKELETAL:  No edema; No deformity  SKIN: Warm and dry NEUROLOGIC:  Alert and oriented x 3 PSYCHIATRIC:  Normal affect   Signed, Garwin Brothersajan R Glenna Brunkow, MD  02/20/2019 2:22 PM    [facility]

## 2019-02-20 NOTE — Patient Instructions (Signed)
Medication Instructions:  Your physician recommends that you continue on your current medications as directed. Please refer to the Current Medication list given to you today.  If you need a refill on your cardiac medications before your next appointment, please call your pharmacy.   Lab work: None  If you have labs (blood work) drawn today and your tests are completely normal, you will receive your results only by: . MyChart Message (if you have MyChart) OR . A paper copy in the mail If you have any lab test that is abnormal or we need to change your treatment, we will call you to review the results.  Testing/Procedures: None  Follow-Up: At [facility], you and your health needs are our priority.  As part of our continuing mission to provide you with exceptional heart care, we have created designated Provider Care Teams.  These Care Teams include your primary Cardiologist (physician) and Advanced Practice Providers (APPs -  Physician Assistants and Nurse Practitioners) who all work together to provide you with the care you need, when you need it. You will need a follow up appointment in 3 months.  Please call our office 2 months in advance to schedule this appointment.  You may see No primary care provider on file. or another member of our [facility] Provider Team in [place]: Robert Krasowski, MD . Brian Munley, MD  Any Other Special Instructions Will Be Listed Below (If Applicable).   

## 2019-02-20 NOTE — Addendum Note (Signed)
Addended by: Beckey Rutter on: 02/20/2019 04:31 PM   Modules accepted: Orders

## 2019-02-24 DIAGNOSIS — D649 Anemia, unspecified: Secondary | ICD-10-CM | POA: Diagnosis not present

## 2019-02-25 DIAGNOSIS — B9689 Other specified bacterial agents as the cause of diseases classified elsewhere: Secondary | ICD-10-CM | POA: Diagnosis not present

## 2019-02-25 DIAGNOSIS — J019 Acute sinusitis, unspecified: Secondary | ICD-10-CM | POA: Diagnosis not present

## 2019-02-25 DIAGNOSIS — J45909 Unspecified asthma, uncomplicated: Secondary | ICD-10-CM | POA: Diagnosis not present

## 2019-03-06 DIAGNOSIS — D500 Iron deficiency anemia secondary to blood loss (chronic): Secondary | ICD-10-CM | POA: Diagnosis not present

## 2019-03-06 DIAGNOSIS — K5900 Constipation, unspecified: Secondary | ICD-10-CM | POA: Diagnosis not present

## 2019-03-06 DIAGNOSIS — K260 Acute duodenal ulcer with hemorrhage: Secondary | ICD-10-CM | POA: Diagnosis not present

## 2019-03-10 ENCOUNTER — Telehealth: Payer: Medicare Other | Admitting: Cardiology

## 2019-03-10 DIAGNOSIS — R350 Frequency of micturition: Secondary | ICD-10-CM | POA: Diagnosis not present

## 2019-03-10 DIAGNOSIS — N390 Urinary tract infection, site not specified: Secondary | ICD-10-CM | POA: Diagnosis not present

## 2019-03-13 DIAGNOSIS — R609 Edema, unspecified: Secondary | ICD-10-CM | POA: Diagnosis not present

## 2019-03-13 DIAGNOSIS — L0390 Cellulitis, unspecified: Secondary | ICD-10-CM | POA: Diagnosis not present

## 2019-03-19 DIAGNOSIS — Z79899 Other long term (current) drug therapy: Secondary | ICD-10-CM | POA: Diagnosis not present

## 2019-03-19 DIAGNOSIS — L0390 Cellulitis, unspecified: Secondary | ICD-10-CM | POA: Diagnosis not present

## 2019-03-19 DIAGNOSIS — D509 Iron deficiency anemia, unspecified: Secondary | ICD-10-CM | POA: Diagnosis not present

## 2019-03-19 DIAGNOSIS — Z5181 Encounter for therapeutic drug level monitoring: Secondary | ICD-10-CM | POA: Diagnosis not present

## 2019-03-19 DIAGNOSIS — Z23 Encounter for immunization: Secondary | ICD-10-CM | POA: Diagnosis not present

## 2019-03-20 DIAGNOSIS — J45909 Unspecified asthma, uncomplicated: Secondary | ICD-10-CM | POA: Diagnosis not present

## 2019-03-25 DIAGNOSIS — D500 Iron deficiency anemia secondary to blood loss (chronic): Secondary | ICD-10-CM | POA: Diagnosis not present

## 2019-04-11 DIAGNOSIS — R600 Localized edema: Secondary | ICD-10-CM | POA: Diagnosis not present

## 2019-04-11 DIAGNOSIS — R21 Rash and other nonspecific skin eruption: Secondary | ICD-10-CM | POA: Diagnosis not present

## 2019-04-23 DIAGNOSIS — Z0000 Encounter for general adult medical examination without abnormal findings: Secondary | ICD-10-CM | POA: Diagnosis not present

## 2019-04-23 DIAGNOSIS — E785 Hyperlipidemia, unspecified: Secondary | ICD-10-CM | POA: Diagnosis not present

## 2019-04-23 DIAGNOSIS — Z9181 History of falling: Secondary | ICD-10-CM | POA: Diagnosis not present

## 2019-04-23 DIAGNOSIS — Z1231 Encounter for screening mammogram for malignant neoplasm of breast: Secondary | ICD-10-CM | POA: Diagnosis not present

## 2019-05-02 DIAGNOSIS — E785 Hyperlipidemia, unspecified: Secondary | ICD-10-CM | POA: Diagnosis not present

## 2019-05-02 DIAGNOSIS — D509 Iron deficiency anemia, unspecified: Secondary | ICD-10-CM | POA: Diagnosis not present

## 2019-05-02 DIAGNOSIS — E559 Vitamin D deficiency, unspecified: Secondary | ICD-10-CM | POA: Diagnosis not present

## 2019-05-02 DIAGNOSIS — I10 Essential (primary) hypertension: Secondary | ICD-10-CM | POA: Diagnosis not present

## 2019-05-02 DIAGNOSIS — E1142 Type 2 diabetes mellitus with diabetic polyneuropathy: Secondary | ICD-10-CM | POA: Diagnosis not present

## 2019-05-02 DIAGNOSIS — G8929 Other chronic pain: Secondary | ICD-10-CM | POA: Diagnosis not present

## 2019-05-08 DIAGNOSIS — D500 Iron deficiency anemia secondary to blood loss (chronic): Secondary | ICD-10-CM | POA: Diagnosis not present

## 2019-05-08 DIAGNOSIS — R195 Other fecal abnormalities: Secondary | ICD-10-CM | POA: Diagnosis not present

## 2019-05-08 DIAGNOSIS — Z8719 Personal history of other diseases of the digestive system: Secondary | ICD-10-CM | POA: Diagnosis not present

## 2019-05-09 DIAGNOSIS — J45909 Unspecified asthma, uncomplicated: Secondary | ICD-10-CM | POA: Diagnosis not present

## 2019-05-16 DIAGNOSIS — E119 Type 2 diabetes mellitus without complications: Secondary | ICD-10-CM | POA: Diagnosis not present

## 2019-05-16 DIAGNOSIS — Z794 Long term (current) use of insulin: Secondary | ICD-10-CM | POA: Diagnosis not present

## 2019-05-16 DIAGNOSIS — E785 Hyperlipidemia, unspecified: Secondary | ICD-10-CM | POA: Diagnosis not present

## 2019-05-20 DIAGNOSIS — J40 Bronchitis, not specified as acute or chronic: Secondary | ICD-10-CM | POA: Diagnosis not present

## 2019-05-21 ENCOUNTER — Ambulatory Visit: Payer: Medicare Other | Admitting: Cardiology

## 2019-06-04 DIAGNOSIS — J45909 Unspecified asthma, uncomplicated: Secondary | ICD-10-CM | POA: Diagnosis not present

## 2019-06-18 DIAGNOSIS — D500 Iron deficiency anemia secondary to blood loss (chronic): Secondary | ICD-10-CM | POA: Diagnosis not present

## 2019-06-24 DIAGNOSIS — R195 Other fecal abnormalities: Secondary | ICD-10-CM | POA: Diagnosis not present

## 2019-06-24 DIAGNOSIS — R110 Nausea: Secondary | ICD-10-CM | POA: Diagnosis not present

## 2019-06-24 DIAGNOSIS — D500 Iron deficiency anemia secondary to blood loss (chronic): Secondary | ICD-10-CM | POA: Diagnosis not present

## 2019-06-27 ENCOUNTER — Telehealth (INDEPENDENT_AMBULATORY_CARE_PROVIDER_SITE_OTHER): Payer: Medicare Other | Admitting: Cardiology

## 2019-06-27 ENCOUNTER — Encounter: Payer: Self-pay | Admitting: Cardiology

## 2019-06-27 ENCOUNTER — Other Ambulatory Visit: Payer: Self-pay

## 2019-06-27 VITALS — BP 137/74 | HR 64

## 2019-06-27 DIAGNOSIS — K922 Gastrointestinal hemorrhage, unspecified: Secondary | ICD-10-CM | POA: Insufficient documentation

## 2019-06-27 DIAGNOSIS — I10 Essential (primary) hypertension: Secondary | ICD-10-CM

## 2019-06-27 DIAGNOSIS — E088 Diabetes mellitus due to underlying condition with unspecified complications: Secondary | ICD-10-CM

## 2019-06-27 DIAGNOSIS — J069 Acute upper respiratory infection, unspecified: Secondary | ICD-10-CM | POA: Diagnosis not present

## 2019-06-27 DIAGNOSIS — I2510 Atherosclerotic heart disease of native coronary artery without angina pectoris: Secondary | ICD-10-CM | POA: Diagnosis not present

## 2019-06-27 DIAGNOSIS — K264 Chronic or unspecified duodenal ulcer with hemorrhage: Secondary | ICD-10-CM

## 2019-06-27 DIAGNOSIS — E782 Mixed hyperlipidemia: Secondary | ICD-10-CM | POA: Diagnosis not present

## 2019-06-27 DIAGNOSIS — I77810 Thoracic aortic ectasia: Secondary | ICD-10-CM

## 2019-06-27 HISTORY — DX: Gastrointestinal hemorrhage, unspecified: K92.2

## 2019-06-27 NOTE — Progress Notes (Signed)
Virtual Visit via Telephone Note   This visit type was conducted due to national recommendations for restrictions regarding the COVID-19 Pandemic (e.g. social distancing) in an effort to limit this patient's exposure and mitigate transmission in our community.  Due to her co-morbid illnesses, this patient is at least at moderate risk for complications without adequate follow up.  This format is felt to be most appropriate for this patient at this time.  The patient did not have access to video technology/had technical difficulties with video requiring transitioning to audio format only (telephone).  All issues noted in this document were discussed and addressed.  No physical exam could be performed with this format.  Please refer to the patient's chart for her  consent to telehealth for [facility].   Date:  06/27/2019   ID:  Holly Spence, DOB August 21, 1945, MRN [id number]  Patient Location: Home Provider Location: Office  PCP:  Paulina Fusi, MD  Cardiologist:  No primary care provider on file.  Electrophysiologist:  None   Evaluation Performed:  Follow-Up Visit  Chief Complaint: Coronary artery disease  History of Present Illness:    Holly Spence is a 73 y.o. female with past medical history of coronary artery disease, essential hypertension and dyslipidemia.  She denies any problems at this time and takes care of activities of daily living.  She mentions to me that she is being evaluated for a bleeding duodenal ulcer and her gastroenterologist Dr. Charm Barges is monitoring her closely.  Her hemoglobin is greater than 13 but apparently he has told her that she still has some occult bleeding.  At the time of my evaluation, the patient is alert awake oriented and in no distress.  The patient does not have symptoms concerning for COVID-19 infection (fever, chills, cough, or new shortness of breath).    Past Medical History:  Diagnosis Date  . Arthritis   . Back pain   . Bruises  easily   . Contact lens/glasses fitting   . Cough   . Diabetes mellitus   . Fatigue   . Hyperlipidemia   . Hypertension   . Poor circulation   . Sinus problem   . Sleep apnea   . SOB (shortness of breath)    Past Surgical History:  Procedure Laterality Date  . CATARACT EXTRACTION     confirm date with patient  . CHOLECYSTECTOMY  1987  . EYE SURGERY  2003   retina  . HERNIA REPAIR    . LAPAROSCOPIC GASTRIC BANDING  03/22/10     Current Meds  Medication Sig  . albuterol (VENTOLIN HFA) 108 (90 Base) MCG/ACT inhaler Inhale 1 puff into the lungs every 6 (six) hours as needed for wheezing or shortness of breath.  . diclofenac sodium (VOLTAREN) 1 % GEL APPLY 4 GRAMS AS DIRECTED TO KNEES EVERY 6 8 HOURS AS NEEDED FOR PAIN  . insulin NPH-regular Human (70-30) 100 UNIT/ML injection Inject into the skin.  Marland Kitchen lisinopril (PRINIVIL,ZESTRIL) 10 MG tablet Take 10 mg by mouth daily.   Marland Kitchen loratadine (CLARITIN) 10 MG tablet Take 10 mg by mouth daily.  Marland Kitchen lovastatin (MEVACOR) 20 MG tablet Take 1 tablet by mouth daily.  . montelukast (SINGULAIR) 10 MG tablet Take 1 tablet by mouth daily.  . ondansetron (ZOFRAN-ODT) 4 MG disintegrating tablet as needed.  . pantoprazole (PROTONIX) 40 MG tablet Take 40 mg by mouth daily.  . predniSONE (STERAPRED UNI-PAK 21 TAB) 5 MG (21) TBPK tablet   . rOPINIRole (REQUIP)  3 MG tablet Take 1 tablet by mouth daily.   . traMADol (ULTRAM) 50 MG tablet Take 2 tablets by mouth 2 (two) times a day.     Allergies:   Penicillins, Sulfa antibiotics, Erythromycin, and Ciprofloxacin hcl   Social History   Tobacco Use  . Smoking status: Never Smoker  . Smokeless tobacco: Never Used  Substance Use Topics  . Alcohol use: No  . Drug use: No     Family Hx: The patient's family history includes Arthritis in her mother; Diabetes in her father and sister; Heart disease in her father and mother; Hypertension in her mother.  ROS:   Please see the history of present illness.      As mentioned above All other systems reviewed and are negative.   Prior CV studies:   The following studies were reviewed today:  IMPRESSIONS    1. The left ventricle has normal systolic function with an ejection fraction of 60-65%. The cavity size was normal. There is mildly increased left ventricular wall thickness. Left ventricular diastolic parameters were normal.  2. The right ventricle has normal systolic function. The cavity was normal. There is no increase in right ventricular wall thickness.  3. The tricuspid valve is grossly normal.  4. The aortic valve was not well visualized. Aortic valve regurgitation was not assessed by color flow Doppler.  5. There is mild dilatation of the ascending aorta measuring 38 mm.  Labs/Other Tests and Data Reviewed:    EKG:  EKG from previous visit was reviewed  Recent Labs: No results found for requested labs within last 8760 hours.   Recent Lipid Panel No results found for: CHOL, TRIG, HDL, CHOLHDL, LDLCALC, LDLDIRECT  Wt Readings from Last 3 Encounters:  02/20/19 291 lb (132 kg)  11/07/18 281 lb (127.5 kg)  10/28/18 281 lb (127.5 kg)     Objective:    Vital Signs:  BP 137/74    VITAL SIGNS:  reviewed  ASSESSMENT & PLAN:    1. Coronary artery disease: Secondary prevention stressed with the patient.  Importance of compliance with diet and medication stressed and she vocalized understanding. 2. Essential hypertension: Blood pressure is stable Mixed dyslipidemia: Lipids managed by primary care physician and she was told recently it was fine Next ascending aortic dilatation: Stable at this time and will be monitored in the future.  She has an upper respiratory tract infection and GI bleeding and therefore will have to wait for these issues to be resolved.  She is not keen on any further evaluation at this time because of her general multiple medical issues and health health and I respect it.  She will get in touch with me whenever  she is ready if not she will be seen in follow-up appointment in 6 months or earlier if she has any concerns.  COVID-19 Education: The signs and symptoms of COVID-19 were discussed with the patient and how to seek care for testing (follow up with PCP or arrange E-visit).  The importance of social distancing was discussed today.  Time:   Today, I have spent 15 minutes with the patient with telehealth technology discussing the above problems.     Medication Adjustments/Labs and Tests Ordered: Current medicines are reviewed at length with the patient today.  Concerns regarding medicines are outlined above.   Tests Ordered: No orders of the defined types were placed in this encounter.   Medication Changes: No orders of the defined types were placed in this encounter.  Follow Up:  Either In Person or Virtual in 6 month(s)  Signed, Garwin Brothersajan R Marirose Deveney, MD  06/27/2019 2:53 PM    [facility]

## 2019-07-14 DIAGNOSIS — J45909 Unspecified asthma, uncomplicated: Secondary | ICD-10-CM | POA: Diagnosis not present

## 2019-07-18 ENCOUNTER — Other Ambulatory Visit: Payer: Self-pay | Admitting: Unknown Physician Specialty

## 2019-07-18 DIAGNOSIS — R195 Other fecal abnormalities: Secondary | ICD-10-CM

## 2019-07-28 DIAGNOSIS — R6889 Other general symptoms and signs: Secondary | ICD-10-CM | POA: Diagnosis not present

## 2019-07-28 DIAGNOSIS — Z20822 Contact with and (suspected) exposure to covid-19: Secondary | ICD-10-CM | POA: Diagnosis not present

## 2019-07-28 DIAGNOSIS — J45909 Unspecified asthma, uncomplicated: Secondary | ICD-10-CM | POA: Diagnosis not present

## 2019-08-12 ENCOUNTER — Inpatient Hospital Stay: Admission: RE | Admit: 2019-08-12 | Payer: Medicare Other | Source: Ambulatory Visit

## 2019-08-18 DIAGNOSIS — E785 Hyperlipidemia, unspecified: Secondary | ICD-10-CM | POA: Diagnosis not present

## 2019-08-18 DIAGNOSIS — Z794 Long term (current) use of insulin: Secondary | ICD-10-CM | POA: Diagnosis not present

## 2019-08-18 DIAGNOSIS — E119 Type 2 diabetes mellitus without complications: Secondary | ICD-10-CM | POA: Diagnosis not present

## 2019-08-21 DIAGNOSIS — H9201 Otalgia, right ear: Secondary | ICD-10-CM | POA: Diagnosis not present

## 2019-08-21 DIAGNOSIS — J42 Unspecified chronic bronchitis: Secondary | ICD-10-CM | POA: Diagnosis not present

## 2019-09-04 DIAGNOSIS — Z139 Encounter for screening, unspecified: Secondary | ICD-10-CM | POA: Diagnosis not present

## 2019-09-04 DIAGNOSIS — G8929 Other chronic pain: Secondary | ICD-10-CM | POA: Diagnosis not present

## 2019-09-04 DIAGNOSIS — D509 Iron deficiency anemia, unspecified: Secondary | ICD-10-CM | POA: Diagnosis not present

## 2019-09-04 DIAGNOSIS — E1142 Type 2 diabetes mellitus with diabetic polyneuropathy: Secondary | ICD-10-CM | POA: Diagnosis not present

## 2019-09-04 DIAGNOSIS — E559 Vitamin D deficiency, unspecified: Secondary | ICD-10-CM | POA: Diagnosis not present

## 2019-09-04 DIAGNOSIS — E785 Hyperlipidemia, unspecified: Secondary | ICD-10-CM | POA: Diagnosis not present

## 2019-09-04 DIAGNOSIS — I10 Essential (primary) hypertension: Secondary | ICD-10-CM | POA: Diagnosis not present

## 2019-09-16 ENCOUNTER — Ambulatory Visit
Admission: RE | Admit: 2019-09-16 | Discharge: 2019-09-16 | Disposition: A | Discharge status: Home / Self Care | Payer: Medicare Other | Source: Ambulatory Visit | Attending: Unknown Physician Specialty | Admitting: Unknown Physician Specialty

## 2019-09-16 DIAGNOSIS — R195 Other fecal abnormalities: Secondary | ICD-10-CM

## 2019-09-16 DIAGNOSIS — K635 Polyp of colon: Secondary | ICD-10-CM | POA: Diagnosis not present

## 2019-10-02 DIAGNOSIS — M25511 Pain in right shoulder: Secondary | ICD-10-CM | POA: Diagnosis not present

## 2019-10-02 DIAGNOSIS — M25551 Pain in right hip: Secondary | ICD-10-CM | POA: Diagnosis not present

## 2019-10-02 DIAGNOSIS — J45909 Unspecified asthma, uncomplicated: Secondary | ICD-10-CM | POA: Diagnosis not present

## 2019-10-02 DIAGNOSIS — G4733 Obstructive sleep apnea (adult) (pediatric): Secondary | ICD-10-CM | POA: Diagnosis not present

## 2019-10-02 DIAGNOSIS — E1165 Type 2 diabetes mellitus with hyperglycemia: Secondary | ICD-10-CM | POA: Diagnosis not present

## 2019-10-02 DIAGNOSIS — J4540 Moderate persistent asthma, uncomplicated: Secondary | ICD-10-CM | POA: Diagnosis not present

## 2019-10-02 DIAGNOSIS — J449 Chronic obstructive pulmonary disease, unspecified: Secondary | ICD-10-CM | POA: Diagnosis not present

## 2019-10-02 DIAGNOSIS — J301 Allergic rhinitis due to pollen: Secondary | ICD-10-CM | POA: Diagnosis not present

## 2019-10-02 DIAGNOSIS — R5383 Other fatigue: Secondary | ICD-10-CM | POA: Diagnosis not present

## 2019-11-02 DIAGNOSIS — M25551 Pain in right hip: Secondary | ICD-10-CM | POA: Diagnosis not present

## 2019-11-02 DIAGNOSIS — J449 Chronic obstructive pulmonary disease, unspecified: Secondary | ICD-10-CM | POA: Diagnosis not present

## 2019-11-02 DIAGNOSIS — M25511 Pain in right shoulder: Secondary | ICD-10-CM | POA: Diagnosis not present

## 2019-11-02 DIAGNOSIS — J45909 Unspecified asthma, uncomplicated: Secondary | ICD-10-CM | POA: Diagnosis not present

## 2019-11-02 DIAGNOSIS — E1165 Type 2 diabetes mellitus with hyperglycemia: Secondary | ICD-10-CM | POA: Diagnosis not present

## 2019-11-17 DIAGNOSIS — E785 Hyperlipidemia, unspecified: Secondary | ICD-10-CM | POA: Diagnosis not present

## 2019-11-17 DIAGNOSIS — Z794 Long term (current) use of insulin: Secondary | ICD-10-CM | POA: Diagnosis not present

## 2019-11-17 DIAGNOSIS — E119 Type 2 diabetes mellitus without complications: Secondary | ICD-10-CM | POA: Diagnosis not present

## 2019-12-02 DIAGNOSIS — J45909 Unspecified asthma, uncomplicated: Secondary | ICD-10-CM | POA: Diagnosis not present

## 2019-12-02 DIAGNOSIS — J449 Chronic obstructive pulmonary disease, unspecified: Secondary | ICD-10-CM | POA: Diagnosis not present

## 2019-12-02 DIAGNOSIS — E785 Hyperlipidemia, unspecified: Secondary | ICD-10-CM | POA: Diagnosis not present

## 2019-12-02 DIAGNOSIS — M25551 Pain in right hip: Secondary | ICD-10-CM | POA: Diagnosis not present

## 2019-12-02 DIAGNOSIS — E559 Vitamin D deficiency, unspecified: Secondary | ICD-10-CM | POA: Diagnosis not present

## 2019-12-02 DIAGNOSIS — R5383 Other fatigue: Secondary | ICD-10-CM | POA: Diagnosis not present

## 2019-12-02 DIAGNOSIS — E1142 Type 2 diabetes mellitus with diabetic polyneuropathy: Secondary | ICD-10-CM | POA: Diagnosis not present

## 2019-12-02 DIAGNOSIS — M25511 Pain in right shoulder: Secondary | ICD-10-CM | POA: Diagnosis not present

## 2019-12-02 DIAGNOSIS — J4540 Moderate persistent asthma, uncomplicated: Secondary | ICD-10-CM | POA: Diagnosis not present

## 2019-12-02 DIAGNOSIS — E1165 Type 2 diabetes mellitus with hyperglycemia: Secondary | ICD-10-CM | POA: Diagnosis not present

## 2019-12-02 DIAGNOSIS — G8929 Other chronic pain: Secondary | ICD-10-CM | POA: Diagnosis not present

## 2019-12-02 DIAGNOSIS — I10 Essential (primary) hypertension: Secondary | ICD-10-CM | POA: Diagnosis not present

## 2019-12-02 DIAGNOSIS — R05 Cough: Secondary | ICD-10-CM | POA: Diagnosis not present

## 2019-12-29 ENCOUNTER — Telehealth: Payer: Self-pay | Admitting: Cardiology

## 2019-12-29 NOTE — Telephone Encounter (Signed)
Called pt back but am unsure who was calling as it was not me.

## 2019-12-29 NOTE — Telephone Encounter (Signed)
Patient calling stating she is returning a call from today. I did not see a note. 

## 2019-12-30 DIAGNOSIS — D500 Iron deficiency anemia secondary to blood loss (chronic): Secondary | ICD-10-CM | POA: Diagnosis not present

## 2020-01-01 ENCOUNTER — Ambulatory Visit: Payer: Medicare Other | Admitting: Cardiology

## 2020-01-02 DIAGNOSIS — M25511 Pain in right shoulder: Secondary | ICD-10-CM | POA: Diagnosis not present

## 2020-01-02 DIAGNOSIS — J45909 Unspecified asthma, uncomplicated: Secondary | ICD-10-CM | POA: Diagnosis not present

## 2020-01-02 DIAGNOSIS — E1165 Type 2 diabetes mellitus with hyperglycemia: Secondary | ICD-10-CM | POA: Diagnosis not present

## 2020-01-02 DIAGNOSIS — J449 Chronic obstructive pulmonary disease, unspecified: Secondary | ICD-10-CM | POA: Diagnosis not present

## 2020-01-02 DIAGNOSIS — M25551 Pain in right hip: Secondary | ICD-10-CM | POA: Diagnosis not present

## 2020-01-14 DIAGNOSIS — H938X2 Other specified disorders of left ear: Secondary | ICD-10-CM | POA: Diagnosis not present

## 2020-01-14 DIAGNOSIS — I10 Essential (primary) hypertension: Secondary | ICD-10-CM | POA: Diagnosis not present

## 2020-01-14 DIAGNOSIS — D509 Iron deficiency anemia, unspecified: Secondary | ICD-10-CM | POA: Diagnosis not present

## 2020-01-22 DIAGNOSIS — J301 Allergic rhinitis due to pollen: Secondary | ICD-10-CM | POA: Diagnosis not present

## 2020-01-22 DIAGNOSIS — J4540 Moderate persistent asthma, uncomplicated: Secondary | ICD-10-CM | POA: Diagnosis not present

## 2020-01-22 DIAGNOSIS — G4733 Obstructive sleep apnea (adult) (pediatric): Secondary | ICD-10-CM | POA: Diagnosis not present

## 2020-01-22 DIAGNOSIS — R5383 Other fatigue: Secondary | ICD-10-CM | POA: Diagnosis not present

## 2020-02-01 DIAGNOSIS — E1165 Type 2 diabetes mellitus with hyperglycemia: Secondary | ICD-10-CM | POA: Diagnosis not present

## 2020-02-01 DIAGNOSIS — J45909 Unspecified asthma, uncomplicated: Secondary | ICD-10-CM | POA: Diagnosis not present

## 2020-02-01 DIAGNOSIS — M25511 Pain in right shoulder: Secondary | ICD-10-CM | POA: Diagnosis not present

## 2020-02-01 DIAGNOSIS — J449 Chronic obstructive pulmonary disease, unspecified: Secondary | ICD-10-CM | POA: Diagnosis not present

## 2020-02-01 DIAGNOSIS — M25551 Pain in right hip: Secondary | ICD-10-CM | POA: Diagnosis not present

## 2020-02-03 ENCOUNTER — Ambulatory Visit: Payer: Medicare Other | Admitting: Cardiology

## 2020-02-14 DIAGNOSIS — G4733 Obstructive sleep apnea (adult) (pediatric): Secondary | ICD-10-CM | POA: Diagnosis not present

## 2020-02-18 DIAGNOSIS — Z794 Long term (current) use of insulin: Secondary | ICD-10-CM | POA: Diagnosis not present

## 2020-02-18 DIAGNOSIS — E119 Type 2 diabetes mellitus without complications: Secondary | ICD-10-CM | POA: Diagnosis not present

## 2020-02-18 DIAGNOSIS — E785 Hyperlipidemia, unspecified: Secondary | ICD-10-CM | POA: Diagnosis not present

## 2020-03-03 DIAGNOSIS — J45909 Unspecified asthma, uncomplicated: Secondary | ICD-10-CM | POA: Diagnosis not present

## 2020-03-03 DIAGNOSIS — E1165 Type 2 diabetes mellitus with hyperglycemia: Secondary | ICD-10-CM | POA: Diagnosis not present

## 2020-03-03 DIAGNOSIS — M25551 Pain in right hip: Secondary | ICD-10-CM | POA: Diagnosis not present

## 2020-03-03 DIAGNOSIS — J449 Chronic obstructive pulmonary disease, unspecified: Secondary | ICD-10-CM | POA: Diagnosis not present

## 2020-03-03 DIAGNOSIS — M25511 Pain in right shoulder: Secondary | ICD-10-CM | POA: Diagnosis not present

## 2020-03-05 DIAGNOSIS — E785 Hyperlipidemia, unspecified: Secondary | ICD-10-CM | POA: Diagnosis not present

## 2020-03-05 DIAGNOSIS — I10 Essential (primary) hypertension: Secondary | ICD-10-CM | POA: Diagnosis not present

## 2020-03-05 DIAGNOSIS — G8929 Other chronic pain: Secondary | ICD-10-CM | POA: Diagnosis not present

## 2020-03-05 DIAGNOSIS — E1142 Type 2 diabetes mellitus with diabetic polyneuropathy: Secondary | ICD-10-CM | POA: Diagnosis not present

## 2020-03-05 DIAGNOSIS — E559 Vitamin D deficiency, unspecified: Secondary | ICD-10-CM | POA: Diagnosis not present

## 2020-03-05 DIAGNOSIS — D509 Iron deficiency anemia, unspecified: Secondary | ICD-10-CM | POA: Diagnosis not present

## 2020-03-18 ENCOUNTER — Ambulatory Visit (INDEPENDENT_AMBULATORY_CARE_PROVIDER_SITE_OTHER): Payer: Medicare Other | Admitting: Cardiology

## 2020-03-18 ENCOUNTER — Other Ambulatory Visit: Payer: Self-pay

## 2020-03-18 ENCOUNTER — Encounter: Payer: Self-pay | Admitting: Cardiology

## 2020-03-18 VITALS — BP 128/60 | HR 78 | Ht 63.0 in | Wt 310.2 lb

## 2020-03-18 DIAGNOSIS — E782 Mixed hyperlipidemia: Secondary | ICD-10-CM

## 2020-03-18 DIAGNOSIS — I10 Essential (primary) hypertension: Secondary | ICD-10-CM | POA: Diagnosis not present

## 2020-03-18 DIAGNOSIS — I712 Thoracic aortic aneurysm, without rupture, unspecified: Secondary | ICD-10-CM

## 2020-03-18 DIAGNOSIS — I77810 Thoracic aortic ectasia: Secondary | ICD-10-CM

## 2020-03-18 DIAGNOSIS — E088 Diabetes mellitus due to underlying condition with unspecified complications: Secondary | ICD-10-CM

## 2020-03-18 NOTE — Progress Notes (Signed)
Cardiology Office Note:    Date:  03/18/2020   ID:  Holly Spence, DOB 09/30/1945, MRN [id number]  PCP:  Paulina Fusi, MD  Cardiologist:  Garwin Brothers, MD   Referring MD: Paulina Fusi, MD    ASSESSMENT:    1. Ascending aorta dilation (HCC)   2. Benign essential hypertension   3. Mixed hyperlipidemia   4. Diabetes mellitus due to underlying condition with unspecified complications (HCC)    PLAN:    In order of problems listed above:  1. Primary prevention stressed with the patient.  Importance of compliance with diet medication stressed and she vocalized understanding 2. Essential hypertension: Blood pressure is stable 3. Mixed dyslipidemia: Lipids were reviewed from [facility] sheet and found to be unremarkable. 4. Diabetes mellitus: Hemoglobin A1c is elevated and I discussed diet with her.  This is managed by her primary care physician. 5. Morbid obesity: I discussed the risks of it and she understands.  She tells me that she is trying hard to do better.Patient will be seen in follow-up appointment in 6 months or earlier if the patient has any concerns    Medication Adjustments/Labs and Tests Ordered: Current medicines are reviewed at length with the patient today.  Concerns regarding medicines are outlined above.  No orders of the defined types were placed in this encounter.  No orders of the defined types were placed in this encounter.    No chief complaint on file.    History of Present Illness:    Holly Spence is a 74 y.o. female.  Patient has past medical history of essential hypertension, diabetes mellitus dilated ascending aorta and morbid obesity.  She denies any problems at this time and leads a sedentary lifestyle because of obesity issues.  She is brought in in a wheelchair today.  At the time of my evaluation, the patient is alert awake oriented and in no distress.  Past Medical History:  Diagnosis Date  . Arthritis   . Back pain   . Bruises  easily   . Contact lens/glasses fitting   . Cough   . Diabetes mellitus   . Fatigue   . Hyperlipidemia   . Hypertension   . Poor circulation   . Sinus problem   . Sleep apnea   . SOB (shortness of breath)     Past Surgical History:  Procedure Laterality Date  . CATARACT EXTRACTION     confirm date with patient  . CHOLECYSTECTOMY  1987  . EYE SURGERY  2003   retina  . HERNIA REPAIR    . LAPAROSCOPIC GASTRIC BANDING  03/22/10    Current Medications: Current Meds  Medication Sig  . acetaminophen (TYLENOL) 325 MG tablet Take 650 mg by mouth every 6 (six) hours as needed.  . Ascorbic Acid (VITA-C PO) Take 1 tablet by mouth daily.  Marland Kitchen BREO ELLIPTA 100-25 MCG/INH AEPB Inhale 1 puff into the lungs daily.  . diclofenac sodium (VOLTAREN) 1 % GEL APPLY 4 GRAMS AS DIRECTED TO KNEES EVERY 6 8 HOURS AS NEEDED FOR PAIN  . insulin NPH-regular Human (70-30) 100 UNIT/ML injection Inject into the skin.  Marland Kitchen ipratropium-albuterol (DUONEB) 0.5-2.5 (3) MG/3ML SOLN Take 3 mLs by nebulization 4 (four) times daily.  . Iron, Ferrous Sulfate, 325 (65 Fe) MG TABS Take 1 tablet by mouth daily.  Marland Kitchen lisinopril-hydrochlorothiazide (ZESTORETIC) 10-12.5 MG tablet Take 1 tablet by mouth daily.  Marland Kitchen loratadine (CLARITIN) 10 MG tablet Take 10 mg by mouth daily.  Marland Kitchen  lovastatin (MEVACOR) 20 MG tablet Take 1 tablet by mouth daily.  . montelukast (SINGULAIR) 10 MG tablet Take 1 tablet by mouth daily.  . ondansetron (ZOFRAN-ODT) 4 MG disintegrating tablet as needed.  . pantoprazole (PROTONIX) 40 MG tablet Take 40 mg by mouth daily.  Marland Kitchen rOPINIRole (REQUIP) 4 MG tablet Take 1 tablet by mouth daily.   . traMADol (ULTRAM) 50 MG tablet Take 2 tablets by mouth 2 (two) times a day.  . Vitamin D, Ergocalciferol, (DRISDOL) 1.25 MG (50000 UNIT) CAPS capsule Take 1 capsule by mouth once a week.  . [DISCONTINUED] lisinopril (PRINIVIL,ZESTRIL) 10 MG tablet Take 10 mg by mouth daily.      Allergies:   Penicillins, Sulfa antibiotics,  Erythromycin, and Ciprofloxacin hcl   Social History   Socioeconomic History  . Marital status: Married    Spouse name: Not on file  . Number of children: Not on file  . Years of education: Not on file  . Highest education level: Not on file  Occupational History  . Not on file  Tobacco Use  . Smoking status: Never Smoker  . Smokeless tobacco: Never Used  Substance and Sexual Activity  . Alcohol use: No  . Drug use: No  . Sexual activity: Not on file  Other Topics Concern  . Not on file  Social History Narrative  . Not on file   Social Determinants of Health   Financial Resource Strain:   . Difficulty of Paying Living Expenses: Not on file  Food Insecurity:   . Worried About Programme researcher, broadcasting/film/video in the Last Year: Not on file  . Ran Out of Food in the Last Year: Not on file  Transportation Needs:   . Lack of Transportation (Medical): Not on file  . Lack of Transportation (Non-Medical): Not on file  Physical Activity:   . Days of Exercise per Week: Not on file  . Minutes of Exercise per Session: Not on file  Stress:   . Feeling of Stress : Not on file  Social Connections:   . Frequency of Communication with Friends and Family: Not on file  . Frequency of Social Gatherings with Friends and Family: Not on file  . Attends Religious Services: Not on file  . Active Member of Clubs or Organizations: Not on file  . Attends Banker Meetings: Not on file  . Marital Status: Not on file     Family History: The patient's family history includes Arthritis in her mother; Diabetes in her father and sister; Heart disease in her father and mother; Hypertension in her mother.  ROS:   Please see the history of present illness.    All other systems reviewed and are negative.  EKGs/Labs/Other Studies Reviewed:    The following studies were reviewed today: I discussed my findings with the patient at length   Recent Labs: No results found for requested labs within  last 8760 hours.  Recent Lipid Panel No results found for: CHOL, TRIG, HDL, CHOLHDL, VLDL, LDLCALC, LDLDIRECT  Physical Exam:    VS:  BP 128/60   Pulse 78   Ht 5\' 3"  (1.6 m)   Wt (!) 310 lb 3.2 oz (140.7 kg)   SpO2 96%   BMI 54.95 kg/m     Wt Readings from Last 3 Encounters:  03/18/20 (!) 310 lb 3.2 oz (140.7 kg)  02/20/19 291 lb (132 kg)  11/07/18 281 lb (127.5 kg)     GEN: Patient is in no acute distress  HEENT: Normal NECK: No JVD; No carotid bruits LYMPHATICS: No lymphadenopathy CARDIAC: Hear sounds regular, 2/6 systolic murmur at the apex. RESPIRATORY:  Clear to auscultation without rales, wheezing or rhonchi  ABDOMEN: Soft, non-tender, non-distended MUSCULOSKELETAL:  No edema; No deformity  SKIN: Warm and dry NEUROLOGIC:  Alert and oriented x 3 PSYCHIATRIC:  Normal affect   Signed, Garwin Brothers, MD  03/18/2020 1:17 PM    [facility]

## 2020-03-18 NOTE — Patient Instructions (Addendum)
Medication Instructions:  Your physician recommends that you continue on your current medications as directed. Please refer to the Current Medication list given to you today.  *If you need a refill on your cardiac medications before your next appointment, please call your pharmacy*   Lab Work: None ordered   If you have labs (blood work) drawn today and your tests are completely normal, you will receive your results only by:  MyChart Message (if you have MyChart) OR  A paper copy in the mail If you have any lab test that is abnormal or we need to change your treatment, we will call you to review the results.   Testing/Procedures: Your physician has ordered for you to have a chest CT at [facility]   Follow-Up: At [facility], you and your health needs are our priority.  As part of our continuing mission to provide you with exceptional heart care, we have created designated Provider Care Teams.  These Care Teams include your primary Cardiologist (physician) and Advanced Practice Providers (APPs -  Physician Assistants and Nurse Practitioners) who all work together to provide you with the care you need, when you need it.  We recommend signing up for the patient portal called "MyChart".  Sign up information is provided on this After Visit Summary.  MyChart is used to connect with patients for Virtual Visits (Telemedicine).  Patients are able to view lab/test results, encounter notes, upcoming appointments, etc.  Non-urgent messages can be sent to your provider as well.   To learn more about what you can do with MyChart, go to [email or web address].    Your next appointment:   6 month(s)  The format for your next appointment:   In Person  Provider:   Belva Crome, MD   Other Instructions None

## 2020-04-01 ENCOUNTER — Telehealth: Payer: Self-pay | Admitting: Cardiology

## 2020-04-01 DIAGNOSIS — J301 Allergic rhinitis due to pollen: Secondary | ICD-10-CM | POA: Diagnosis not present

## 2020-04-01 DIAGNOSIS — J4540 Moderate persistent asthma, uncomplicated: Secondary | ICD-10-CM | POA: Diagnosis not present

## 2020-04-01 DIAGNOSIS — R5383 Other fatigue: Secondary | ICD-10-CM | POA: Diagnosis not present

## 2020-04-01 DIAGNOSIS — G4733 Obstructive sleep apnea (adult) (pediatric): Secondary | ICD-10-CM | POA: Diagnosis not present

## 2020-04-01 NOTE — Telephone Encounter (Signed)
New Message:    Pt says she still have not heard anything about her CT. She said she is supposed to have it at the hospital.

## 2020-04-02 NOTE — Telephone Encounter (Signed)
Attempted to reach patient in regards to Chest CT, No answer. Patient was told to take order form to check out to schedule CT when she was in the office. Call was placed to see if the patient forgot to give check the form. Will create a new form for pt to get chest CT done at [facility].

## 2020-04-03 DIAGNOSIS — M25551 Pain in right hip: Secondary | ICD-10-CM | POA: Diagnosis not present

## 2020-04-03 DIAGNOSIS — E1165 Type 2 diabetes mellitus with hyperglycemia: Secondary | ICD-10-CM | POA: Diagnosis not present

## 2020-04-03 DIAGNOSIS — M25511 Pain in right shoulder: Secondary | ICD-10-CM | POA: Diagnosis not present

## 2020-04-03 DIAGNOSIS — J449 Chronic obstructive pulmonary disease, unspecified: Secondary | ICD-10-CM | POA: Diagnosis not present

## 2020-04-03 DIAGNOSIS — J45909 Unspecified asthma, uncomplicated: Secondary | ICD-10-CM | POA: Diagnosis not present

## 2020-04-05 NOTE — Telephone Encounter (Signed)
° ° °  Pt is returning call, she said the only dates that she is unavailable are 10/06 and 10/21.

## 2020-04-06 ENCOUNTER — Telehealth: Payer: Medicare Other | Admitting: Cardiology

## 2020-04-06 NOTE — Telephone Encounter (Signed)
Follow up:    Patient calling to check the status of the call back from yesterday.

## 2020-04-08 NOTE — Addendum Note (Signed)
Addended by: Eleonore Chiquito on: 04/08/2020 08:51 AM   Modules accepted: Orders

## 2020-04-09 NOTE — Telephone Encounter (Signed)
Patient aware of the upcoming appointment for her CT.

## 2020-04-19 ENCOUNTER — Telehealth: Payer: Self-pay | Admitting: Cardiology

## 2020-04-19 NOTE — Telephone Encounter (Signed)
Called patient and answered her questions regarding CT withour contrast at [facility]. No additional questions or concerns. Pt verbalized understanding.

## 2020-04-19 NOTE — Telephone Encounter (Signed)
Patient has questions regarding appointment on 10/12 for her CT, patient wants to know if she is to fast.

## 2020-04-27 ENCOUNTER — Encounter: Payer: Self-pay | Admitting: Cardiology

## 2020-04-27 DIAGNOSIS — I712 Thoracic aortic aneurysm, without rupture: Secondary | ICD-10-CM | POA: Diagnosis not present

## 2020-04-27 DIAGNOSIS — I77810 Thoracic aortic ectasia: Secondary | ICD-10-CM | POA: Diagnosis not present

## 2020-04-27 DIAGNOSIS — I700 Atherosclerosis of aorta: Secondary | ICD-10-CM | POA: Diagnosis not present

## 2020-04-27 DIAGNOSIS — I2510 Atherosclerotic heart disease of native coronary artery without angina pectoris: Secondary | ICD-10-CM | POA: Diagnosis not present

## 2020-04-27 DIAGNOSIS — I272 Pulmonary hypertension, unspecified: Secondary | ICD-10-CM | POA: Diagnosis not present

## 2020-04-29 ENCOUNTER — Telehealth: Payer: Self-pay | Admitting: Cardiology

## 2020-04-29 NOTE — Telephone Encounter (Signed)
Done

## 2020-04-29 NOTE — Telephone Encounter (Signed)
Pt called in and would like to know if the results of her CT Scan can be faxed to:    [facility] [address] [place], [place] [address]  [phone] Fax number - [phone]

## 2020-05-06 ENCOUNTER — Telehealth: Payer: Self-pay

## 2020-05-06 NOTE — Telephone Encounter (Signed)
Results reviewed with pt as per Dr. Revankar's note.  Pt verbalized understanding and had no additional questions. [facility] CT  

## 2020-06-10 ENCOUNTER — Other Ambulatory Visit: Payer: Self-pay

## 2020-06-10 ENCOUNTER — Telehealth: Payer: Self-pay | Admitting: Cardiology

## 2020-06-10 NOTE — Telephone Encounter (Signed)
Pt is unable to make an appointment for 12/3 until she knows if her husband sleeps tonight. Will call pt in am to see if we can see her.

## 2020-06-10 NOTE — Telephone Encounter (Signed)
New Message:     Pt said she talked to her primary doctor 's office today and they told her make an appt to see Dr Tomie China. Pt's heart rate is low.     STAT if HR is under 50 or over 120 (normal HR is 60-100 beats per minute)  1) What is your heart rate? 47 this morning, yesterday 57  2) Do you have a log of your heart rate readings (document readings)? yes  3) Do you have any other symptoms? Palpitations off an on

## 2020-06-11 ENCOUNTER — Other Ambulatory Visit: Payer: Self-pay

## 2020-06-11 ENCOUNTER — Encounter (HOSPITAL_COMMUNITY): Payer: Self-pay

## 2020-06-11 ENCOUNTER — Inpatient Hospital Stay (HOSPITAL_COMMUNITY)
Admission: EM | Disposition: A | Discharge status: Home / Self Care | Payer: Self-pay | Source: Home / Self Care | Attending: Cardiology

## 2020-06-11 ENCOUNTER — Telehealth: Payer: Self-pay

## 2020-06-11 ENCOUNTER — Ambulatory Visit: Payer: Medicare Other | Admitting: Cardiology

## 2020-06-11 ENCOUNTER — Emergency Department (HOSPITAL_COMMUNITY): Payer: Medicare Other

## 2020-06-11 ENCOUNTER — Encounter: Payer: Self-pay | Admitting: Cardiology

## 2020-06-11 ENCOUNTER — Emergency Department (HOSPITAL_BASED_OUTPATIENT_CLINIC_OR_DEPARTMENT_OTHER): Payer: Medicare Other

## 2020-06-11 ENCOUNTER — Inpatient Hospital Stay (HOSPITAL_COMMUNITY)
Admission: EM | Admit: 2020-06-11 | Discharge: 2020-06-12 | DRG: 243 | Disposition: A | Discharge status: Home / Self Care | Payer: Medicare Other | Attending: Cardiology | Admitting: Cardiology

## 2020-06-11 VITALS — BP 145/41 | HR 51 | Ht 63.0 in | Wt 317.2 lb

## 2020-06-11 DIAGNOSIS — I442 Atrioventricular block, complete: Secondary | ICD-10-CM | POA: Diagnosis not present

## 2020-06-11 DIAGNOSIS — R001 Bradycardia, unspecified: Secondary | ICD-10-CM

## 2020-06-11 DIAGNOSIS — Z794 Long term (current) use of insulin: Secondary | ICD-10-CM | POA: Diagnosis not present

## 2020-06-11 DIAGNOSIS — I10 Essential (primary) hypertension: Secondary | ICD-10-CM | POA: Diagnosis not present

## 2020-06-11 DIAGNOSIS — Z882 Allergy status to sulfonamides status: Secondary | ICD-10-CM

## 2020-06-11 DIAGNOSIS — I77810 Thoracic aortic ectasia: Secondary | ICD-10-CM | POA: Diagnosis not present

## 2020-06-11 DIAGNOSIS — Z87891 Personal history of nicotine dependence: Secondary | ICD-10-CM

## 2020-06-11 DIAGNOSIS — Z8261 Family history of arthritis: Secondary | ICD-10-CM

## 2020-06-11 DIAGNOSIS — Z8249 Family history of ischemic heart disease and other diseases of the circulatory system: Secondary | ICD-10-CM

## 2020-06-11 DIAGNOSIS — E785 Hyperlipidemia, unspecified: Secondary | ICD-10-CM | POA: Diagnosis not present

## 2020-06-11 DIAGNOSIS — M1990 Unspecified osteoarthritis, unspecified site: Secondary | ICD-10-CM | POA: Diagnosis not present

## 2020-06-11 DIAGNOSIS — E782 Mixed hyperlipidemia: Secondary | ICD-10-CM

## 2020-06-11 DIAGNOSIS — Z833 Family history of diabetes mellitus: Secondary | ICD-10-CM

## 2020-06-11 DIAGNOSIS — R9431 Abnormal electrocardiogram [ECG] [EKG]: Secondary | ICD-10-CM

## 2020-06-11 DIAGNOSIS — E119 Type 2 diabetes mellitus without complications: Secondary | ICD-10-CM | POA: Diagnosis present

## 2020-06-11 DIAGNOSIS — Z79899 Other long term (current) drug therapy: Secondary | ICD-10-CM

## 2020-06-11 DIAGNOSIS — Z20822 Contact with and (suspected) exposure to covid-19: Secondary | ICD-10-CM | POA: Diagnosis not present

## 2020-06-11 DIAGNOSIS — R0781 Pleurodynia: Secondary | ICD-10-CM

## 2020-06-11 DIAGNOSIS — Z9884 Bariatric surgery status: Secondary | ICD-10-CM | POA: Diagnosis not present

## 2020-06-11 DIAGNOSIS — K219 Gastro-esophageal reflux disease without esophagitis: Secondary | ICD-10-CM | POA: Diagnosis present

## 2020-06-11 DIAGNOSIS — Z950 Presence of cardiac pacemaker: Secondary | ICD-10-CM

## 2020-06-11 DIAGNOSIS — J45909 Unspecified asthma, uncomplicated: Secondary | ICD-10-CM | POA: Diagnosis present

## 2020-06-11 DIAGNOSIS — E088 Diabetes mellitus due to underlying condition with unspecified complications: Secondary | ICD-10-CM | POA: Diagnosis not present

## 2020-06-11 DIAGNOSIS — Z6841 Body Mass Index (BMI) 40.0 and over, adult: Secondary | ICD-10-CM

## 2020-06-11 DIAGNOSIS — Z881 Allergy status to other antibiotic agents status: Secondary | ICD-10-CM

## 2020-06-11 DIAGNOSIS — R0902 Hypoxemia: Secondary | ICD-10-CM | POA: Diagnosis not present

## 2020-06-11 DIAGNOSIS — Z880 Allergy status to penicillin: Secondary | ICD-10-CM | POA: Diagnosis not present

## 2020-06-11 DIAGNOSIS — G4730 Sleep apnea, unspecified: Secondary | ICD-10-CM | POA: Diagnosis not present

## 2020-06-11 DIAGNOSIS — R531 Weakness: Secondary | ICD-10-CM | POA: Diagnosis not present

## 2020-06-11 DIAGNOSIS — I499 Cardiac arrhythmia, unspecified: Secondary | ICD-10-CM | POA: Diagnosis not present

## 2020-06-11 DIAGNOSIS — Z993 Dependence on wheelchair: Secondary | ICD-10-CM

## 2020-06-11 HISTORY — PX: PACEMAKER IMPLANT: EP1218

## 2020-06-11 HISTORY — DX: Atrioventricular block, complete: I44.2

## 2020-06-11 HISTORY — DX: Bradycardia, unspecified: R00.1

## 2020-06-11 LAB — ECHOCARDIOGRAM COMPLETE
Calc EF: 69.1 %
Height: 63 in
S' Lateral: 3.6 cm
Single Plane A2C EF: 65 %
Single Plane A4C EF: 74.1 %
Weight: 5075.87 oz

## 2020-06-11 LAB — CBC WITH DIFFERENTIAL/PLATELET
Abs Immature Granulocytes: 0.04 10*3/uL (ref 0.00–0.07)
Basophils Absolute: 0 10*3/uL (ref 0.0–0.1)
Basophils Relative: 0 %
Eosinophils Absolute: 0.1 10*3/uL (ref 0.0–0.5)
Eosinophils Relative: 1 %
HCT: 43.6 % (ref 36.0–46.0)
Hemoglobin: 13 g/dL (ref 12.0–15.0)
Immature Granulocytes: 0 %
Lymphocytes Relative: 21 %
Lymphs Abs: 2 10*3/uL (ref 0.7–4.0)
MCH: 29.1 pg (ref 26.0–34.0)
MCHC: 29.8 g/dL — ABNORMAL LOW (ref 30.0–36.0)
MCV: 97.8 fL (ref 80.0–100.0)
Monocytes Absolute: 0.6 10*3/uL (ref 0.1–1.0)
Monocytes Relative: 6 %
Neutro Abs: 6.9 10*3/uL (ref 1.7–7.7)
Neutrophils Relative %: 72 %
Platelets: 172 10*3/uL (ref 150–400)
RBC: 4.46 MIL/uL (ref 3.87–5.11)
RDW: 13.5 % (ref 11.5–15.5)
WBC: 9.6 10*3/uL (ref 4.0–10.5)
nRBC: 0 % (ref 0.0–0.2)

## 2020-06-11 LAB — BASIC METABOLIC PANEL
Anion gap: 11 (ref 5–15)
BUN: 43 mg/dL — ABNORMAL HIGH (ref 8–23)
CO2: 25 mmol/L (ref 22–32)
Calcium: 8.3 mg/dL — ABNORMAL LOW (ref 8.9–10.3)
Chloride: 102 mmol/L (ref 98–111)
Creatinine, Ser: 1.58 mg/dL — ABNORMAL HIGH (ref 0.44–1.00)
GFR, Estimated: 34 mL/min — ABNORMAL LOW (ref >60)
Glucose, Bld: 273 mg/dL — ABNORMAL HIGH (ref 70–99)
Potassium: 5.8 mmol/L — ABNORMAL HIGH (ref 3.5–5.1)
Sodium: 138 mmol/L (ref 135–145)

## 2020-06-11 LAB — HEMOGLOBIN A1C
Hgb A1c MFr Bld: 9.3 % — ABNORMAL HIGH (ref 4.8–5.6)
Mean Plasma Glucose: 220.21 mg/dL

## 2020-06-11 LAB — GLUCOSE, CAPILLARY: Glucose-Capillary: 343 mg/dL — ABNORMAL HIGH (ref 70–99)

## 2020-06-11 LAB — RESP PANEL BY RT-PCR (FLU A&B, COVID) ARPGX2
Influenza A by PCR: NEGATIVE
Influenza B by PCR: NEGATIVE
SARS Coronavirus 2 by RT PCR: NEGATIVE

## 2020-06-11 LAB — TSH: TSH: 1.226 u[IU]/mL (ref 0.350–4.500)

## 2020-06-11 SURGERY — PACEMAKER IMPLANT

## 2020-06-11 MED ORDER — TRAMADOL HCL 50 MG PO TABS
100.0000 mg | ORAL_TABLET | Freq: Two times a day (BID) | ORAL | Status: DC | PRN
  Administered 2020-06-11 – 2020-06-12 (×2): 100 mg via ORAL
  Filled 2020-06-11 (×2): qty 2

## 2020-06-11 MED ORDER — LIDOCAINE HCL (PF) 1 % IJ SOLN
INTRAMUSCULAR | Status: DC | PRN
  Administered 2020-06-11: 60 mL

## 2020-06-11 MED ORDER — ROPINIROLE HCL 1 MG PO TABS
4.0000 mg | ORAL_TABLET | Freq: Every day | ORAL | Status: DC
  Administered 2020-06-11: 4 mg via ORAL
  Filled 2020-06-11 (×2): qty 4

## 2020-06-11 MED ORDER — HEPARIN (PORCINE) IN NACL 1000-0.9 UT/500ML-% IV SOLN
INTRAVENOUS | Status: DC | PRN
  Administered 2020-06-11: 500 mL

## 2020-06-11 MED ORDER — CEFAZOLIN SODIUM-DEXTROSE 2-4 GM/100ML-% IV SOLN
INTRAVENOUS | Status: AC
  Filled 2020-06-11: qty 100

## 2020-06-11 MED ORDER — ONDANSETRON HCL 4 MG/2ML IJ SOLN: 4.0000 mg | Freq: Four times a day (QID) | INTRAMUSCULAR | Status: DC | PRN

## 2020-06-11 MED ORDER — SODIUM CHLORIDE 0.9 % IV SOLN
INTRAVENOUS | Status: AC
  Filled 2020-06-11: qty 2

## 2020-06-11 MED ORDER — LIDOCAINE HCL (PF) 1 % IJ SOLN
INTRAMUSCULAR | Status: AC
  Filled 2020-06-11: qty 60

## 2020-06-11 MED ORDER — VANCOMYCIN HCL 1500 MG/300ML IV SOLN
1500.0000 mg | INTRAVENOUS | Status: AC
  Administered 2020-06-11: 1500 mg via INTRAVENOUS
  Filled 2020-06-11: qty 300

## 2020-06-11 MED ORDER — SODIUM CHLORIDE 0.9 % IV SOLN
80.0000 mg | INTRAVENOUS | Status: DC
  Filled 2020-06-11: qty 2

## 2020-06-11 MED ORDER — HYDROCHLOROTHIAZIDE 12.5 MG PO CAPS
12.5000 mg | ORAL_CAPSULE | Freq: Every day | ORAL | Status: DC
  Administered 2020-06-12: 12.5 mg via ORAL
  Filled 2020-06-11: qty 1

## 2020-06-11 MED ORDER — LISINOPRIL-HYDROCHLOROTHIAZIDE 10-12.5 MG PO TABS: 1.0000 | ORAL_TABLET | Freq: Every day | ORAL | Status: DC

## 2020-06-11 MED ORDER — SODIUM CHLORIDE 0.9 % IV SOLN: 250.0000 mL | INTRAVENOUS | Status: DC

## 2020-06-11 MED ORDER — CHLORHEXIDINE GLUCONATE 4 % EX LIQD
60.0000 mL | Freq: Once | CUTANEOUS | Status: DC
  Filled 2020-06-11: qty 60

## 2020-06-11 MED ORDER — SODIUM CHLORIDE 0.9% FLUSH: 3.0000 mL | INTRAVENOUS | Status: DC | PRN

## 2020-06-11 MED ORDER — ACETAMINOPHEN 325 MG PO TABS
325.0000 mg | ORAL_TABLET | ORAL | Status: DC | PRN
  Administered 2020-06-12 (×2): 650 mg via ORAL
  Filled 2020-06-11 (×2): qty 2

## 2020-06-11 MED ORDER — SODIUM CHLORIDE 0.9 % IV SOLN
INTRAVENOUS | Status: DC | PRN
  Administered 2020-06-11: 500 mL

## 2020-06-11 MED ORDER — VANCOMYCIN HCL 1500 MG/300ML IV SOLN
1500.0000 mg | INTRAVENOUS | Status: DC
  Filled 2020-06-11: qty 300

## 2020-06-11 MED ORDER — SODIUM CHLORIDE 0.9% FLUSH: 3.0000 mL | Freq: Two times a day (BID) | INTRAVENOUS | Status: DC

## 2020-06-11 MED ORDER — MONTELUKAST SODIUM 10 MG PO TABS
10.0000 mg | ORAL_TABLET | Freq: Every day | ORAL | Status: DC
  Administered 2020-06-11 – 2020-06-12 (×2): 10 mg via ORAL
  Filled 2020-06-11 (×2): qty 1

## 2020-06-11 MED ORDER — PANTOPRAZOLE SODIUM 40 MG PO TBEC
40.0000 mg | DELAYED_RELEASE_TABLET | Freq: Every day | ORAL | Status: DC
  Administered 2020-06-11 – 2020-06-12 (×2): 40 mg via ORAL
  Filled 2020-06-11 (×2): qty 1

## 2020-06-11 MED ORDER — SODIUM CHLORIDE 0.9 % IV SOLN: INTRAVENOUS | Status: DC

## 2020-06-11 MED ORDER — PRAVASTATIN SODIUM 10 MG PO TABS
20.0000 mg | ORAL_TABLET | Freq: Every day | ORAL | Status: DC
  Filled 2020-06-11: qty 2

## 2020-06-11 MED ORDER — LISINOPRIL 10 MG PO TABS
10.0000 mg | ORAL_TABLET | Freq: Every day | ORAL | Status: DC
  Filled 2020-06-11: qty 1

## 2020-06-11 MED ORDER — INSULIN ASPART 100 UNIT/ML ~~LOC~~ SOLN
0.0000 [IU] | Freq: Three times a day (TID) | SUBCUTANEOUS | Status: DC
  Administered 2020-06-12: 15 [IU] via SUBCUTANEOUS
  Administered 2020-06-12: 11 [IU] via SUBCUTANEOUS

## 2020-06-11 MED ORDER — HEPARIN (PORCINE) IN NACL 1000-0.9 UT/500ML-% IV SOLN
INTRAVENOUS | Status: AC
  Filled 2020-06-11: qty 500

## 2020-06-11 MED ORDER — PERFLUTREN LIPID MICROSPHERE
1.0000 mL | INTRAVENOUS | Status: DC | PRN
  Administered 2020-06-11: 2 mL via INTRAVENOUS
  Filled 2020-06-11: qty 10

## 2020-06-11 SURGICAL SUPPLY — 9 items
CABLE SURGICAL S-101-97-12 (CABLE) ×3 IMPLANT
COVER DOME SNAP 22 D (MISCELLANEOUS) ×3 IMPLANT
LEAD TENDRIL MRI 52CM LPA1200M (Lead) ×3 IMPLANT
LEAD TENDRIL MRI 58CM LPA1200M (Lead) ×3 IMPLANT
PACEMAKER ASSURITY DR-RF (Pacemaker) ×3 IMPLANT
PAD PRO RADIOLUCENT 2001M-C (PAD) ×3 IMPLANT
SHEATH 8FR PRELUDE SNAP 13 (SHEATH) ×6 IMPLANT
SHEATH PROBE COVER 6X72 (BAG) ×3 IMPLANT
TRAY PACEMAKER INSERTION (PACKS) ×3 IMPLANT

## 2020-06-11 NOTE — ED Provider Notes (Signed)
[facility] EMERGENCY DEPARTMENT Provider Note   CSN: [id number] Arrival date & time: 06/11/20  1215     History Chief Complaint  Patient presents with  . Bradycardia    Holly Spence is a 74 y.o. female with a past medical history of hypertension, hyperlipidemia, diabetes, obesity, asthma presenting to the ED with a chief complaint of bradycardia.  For the past 2 to 3 weeks has been feeling generally weak, fatigued with low energy and appetite level.  3 months ago she had adjustment to her medications including adding low-dose HCTZ to her lisinopril.  She has had some left-sided chest pain last night which has resolved.  Has baseline shortness of breath due to her asthma.  She denies any vomiting, abdominal pain but does endorse nonbloody diarrhea.  She was evaluated by her cardiologist this morning and sent to the ER for her bradycardia.  HPI     Past Medical History:  Diagnosis Date  . Arthritis   . Back pain   . Bruises easily   . Contact lens/glasses fitting   . Cough   . Diabetes mellitus   . Fatigue   . Hyperlipidemia   . Hypertension   . Poor circulation   . Sinus problem   . Sleep apnea   . SOB (shortness of breath)     Patient Active Problem List   Diagnosis Date Noted  . GI bleeding 06/27/2019  . Ascending aorta dilation (HCC) 12/09/2018  . Diabetes mellitus due to underlying condition with unspecified complications (HCC) 11/10/2015  . Palpitations 11/10/2015  . Allergic rhinitis 09/20/2015  . Benign essential hypertension 09/20/2015  . Gastroesophageal reflux disease 09/20/2015  . Hyperlipidemia 09/20/2015  . Insulin long-term use (HCC) 09/20/2015  . Uncontrolled type 2 diabetes mellitus without complication, with long-term current use of insulin 09/20/2015  . Hx of laparoscopic gastric banding 01/31/2011    Past Surgical History:  Procedure Laterality Date  . CATARACT EXTRACTION     confirm date with patient  . CHOLECYSTECTOMY   1987  . EYE SURGERY  2003   retina  . HERNIA REPAIR    . LAPAROSCOPIC GASTRIC BANDING  03/22/10     OB History   No obstetric history on file.     Family History  Problem Relation Age of Onset  . Arthritis Mother   . Heart disease Mother   . Hypertension Mother   . Heart disease Father   . Diabetes Father   . Diabetes Sister     Social History   Tobacco Use  . Smoking status: Never Smoker  . Smokeless tobacco: Never Used  Substance Use Topics  . Alcohol use: No  . Drug use: No    Home Medications Prior to Admission medications   Medication Sig Start Date End Date Taking? Authorizing Provider  acetaminophen (TYLENOL) 325 MG tablet Take 650 mg by mouth in the morning and at bedtime.    Yes [provider]  albuterol (VENTOLIN HFA) 108 (90 Base) MCG/ACT inhaler Inhale 2 puffs into the lungs every 6 (six) hours as needed for wheezing or shortness of breath.  04/02/20  Yes [provider]  Ascorbic Acid (VITA-C PO) Take 1 tablet by mouth daily.   Yes [provider]  diclofenac sodium (VOLTAREN) 1 % GEL Apply 4 g topically every 6 (six) hours as needed (pain).  10/23/18  Yes [provider]  furosemide (LASIX) 40 MG tablet Take 40 mg by mouth daily as needed for fluid  or edema.   Yes [provider]  insulin NPH-regular Human (70-30) 100 UNIT/ML injection Inject 55-90 Units into the skin 2 (two) times daily with a meal. Per sliding scale : not provided   Yes [provider]  ipratropium-albuterol (DUONEB) 0.5-2.5 (3) MG/3ML SOLN Take 3 mLs by nebulization 4 (four) times daily. 01/22/20  Yes [provider]  Iron, Ferrous Sulfate, 325 (65 Fe) MG TABS Take 325 mg by mouth daily.    Yes [provider]  lisinopril-hydrochlorothiazide (ZESTORETIC) 10-12.5 MG tablet Take 1 tablet by mouth daily. 01/14/20  Yes [provider]  loratadine (CLARITIN) 10 MG tablet Take 10 mg by mouth daily.   Yes [provider]  lovastatin (MEVACOR) 20 MG tablet Take 20 mg by mouth daily.  08/30/18  Yes [provider]  montelukast (SINGULAIR) 10 MG tablet Take 10 mg by mouth daily.  08/30/18  Yes [provider]  ondansetron (ZOFRAN-ODT) 4 MG disintegrating tablet Take 4 mg by mouth every 8 (eight) hours as needed for nausea or vomiting.  06/24/19  Yes [provider]  pantoprazole (PROTONIX) 40 MG tablet Take 40 mg by mouth daily.   Yes [provider]  rOPINIRole (REQUIP) 4 MG tablet Take 4 mg by mouth at bedtime.  10/22/18  Yes [provider]  traMADol (ULTRAM) 50 MG tablet Take 100 mg by mouth 2 (two) times a day.  12/14/14  Yes [provider]  Vitamin D, Ergocalciferol, (DRISDOL) 1.25 MG (50000 UNIT) CAPS capsule Take 50,000 Units by mouth once a week. Sunday 01/14/20  Yes [provider]    Allergies    Penicillins, Sulfa antibiotics, Erythromycin, and Ciprofloxacin hcl  Review of Systems   Review of Systems  Constitutional: Positive for activity change, appetite change and fatigue. Negative for chills and fever.  HENT: Negative for ear pain, rhinorrhea, sneezing and sore throat.   Eyes: Negative for photophobia and visual disturbance.  Respiratory: Negative for cough, chest tightness, shortness of breath and wheezing.   Cardiovascular: Negative for chest pain and palpitations.  Gastrointestinal: Negative for abdominal pain, blood in stool, constipation, diarrhea, nausea and vomiting.  Genitourinary: Negative for dysuria, hematuria and urgency.  Musculoskeletal: Negative for myalgias.  Skin: Negative for rash.  Neurological: Positive for dizziness. Negative for weakness and light-headedness.    Physical Exam Updated Vital Signs BP (!) 115/55 (BP Location: Left Arm)   Pulse (!) 45   Temp 99.2 F (37.3 C) (Oral)   Resp 20   Ht 5\' 3"  (1.6 m)   Wt (!) 143.9 kg   SpO2 96%   BMI 56.20 kg/m   Physical Exam Vitals and nursing  note reviewed.  Constitutional:      General: She is not in acute distress.    Appearance: She is well-developed. She is obese.  HENT:     Head: Normocephalic and atraumatic.     Nose: Nose normal.  Eyes:     General: No scleral icterus.       Right eye: No discharge.        Left eye: No discharge.     Conjunctiva/sclera: Conjunctivae normal.  Cardiovascular:     Rate and Rhythm: Regular rhythm. Bradycardia present.     Heart sounds: Normal heart sounds. No murmur heard.  No friction rub. No gallop.   Pulmonary:     Effort: Pulmonary effort is normal. No respiratory distress.     Breath sounds: Normal breath sounds.  Abdominal:  General: Bowel sounds are normal. There is no distension.     Palpations: Abdomen is soft.     Tenderness: There is no abdominal tenderness. There is no guarding.  Musculoskeletal:        General: Normal range of motion.     Cervical back: Normal range of motion and neck supple.  Skin:    General: Skin is warm and dry.     Findings: No rash.  Neurological:     General: No focal deficit present.     Mental Status: She is alert and oriented to person, place, and time.     Sensory: No sensory deficit.     Motor: No weakness or abnormal muscle tone.     Coordination: Coordination normal.     ED Results / Procedures / Treatments   Labs (all labs ordered are listed, but only abnormal results are displayed) Labs Reviewed  CBC WITH DIFFERENTIAL/PLATELET - Abnormal; Notable for the following components:      Result Value   MCHC 29.8 (*)    All other components within normal limits  RESP PANEL BY RT-PCR (FLU A&B, COVID) ARPGX2  BASIC METABOLIC PANEL  TSH  TROPONIN I (HIGH SENSITIVITY)    EKG EKG Interpretation  Date/Time:  Friday June 11 2020 12:30:59 EST Ventricular Rate:  41 PR Interval:    QRS Duration: 153 QT Interval:  528 QTC Calculation: 436 R Axis:   -23 Text Interpretation: Sinus bradycardia Nonspecific intraventricular  conduction delay Minimal ST depression, inferior leads Confirmed by Kristine Royal [phone]) on 06/11/2020 12:32:39 PM Also confirmed by Kristine Royal [phone]), editor Elita Quick [phone])  on 06/11/2020 2:11:19 PM   Radiology DG Chest Portable 1 View  Result Date: 06/11/2020 CLINICAL DATA:  weaknessweakness EXAM: PORTABLE CHEST 1 VIEW COMPARISON:  07/06/2016 FINDINGS: External pads overlie the LEFT hemithorax. Stable cardiac silhouette. No effusion, infiltrate or pneumothorax. No acute osseous abnormality. IMPRESSION: No acute cardiopulmonary process. Electronically Signed   By: Genevive Bi M.D.   On: 06/11/2020 13:12    Procedures Procedures (including critical care time)  Medications Ordered in ED Medications - No data to display  ED Course  I have reviewed the triage vital signs and the nursing notes.  Pertinent labs & imaging results that were available during my care of the patient were reviewed by me and considered in my medical decision making (see chart for details).    MDM Rules/Calculators/A&P                          74 year old female presenting to the ED from cardiologist office for concern for heart block.  She states that for the past 2 to 3 weeks she has had intermittent dizziness, generalized weakness, fatigue, low energy and appetite level.  Recent medication changes about 3 months ago including adding HCTZ to her lisinopril.  She had some left-sided chest pain last night that has resolved.  Reports baseline shortness of breath due to her asthma.  Reviewed cardiology note from earlier today.  Her EKG here shows sinus bradycardia with possible 2:1 conduction.  Her heart rates in the 40s.  I have consulted cardiology team who is aware of the patient and they will evaluate her at the bedside.  CXR unremarkable. CBC is unremarkable, Covid test is negative.  Remainder of lab work is pending including BMP and TSH, troponin requested by cardiology.  Patient dispo per  cardiology recommendations, likely admission for potential pacemaker placement.  Portions of this note were generated with Scientist, clinical (histocompatibility and immunogenetics)Dragon dictation software. Dictation errors may occur despite best attempts at proofreading.  Final Clinical Impression(s) / ED Diagnoses Final diagnoses:  Bradycardia    Rx / DC Orders ED Discharge Orders    None       Dietrich PatesKhatri, Aily Tzeng, PA-C 06/11/20 1505    Wynetta FinesMessick, Peter C, MD 06/13/20 1718

## 2020-06-11 NOTE — ED Triage Notes (Signed)
Pt BIB [facility] from cardiologist office for weakness x2 weeks, pt found to be in 2nd degree heart block no hx of HB. Pt in complete HB with EMS.  BP 152/90 98% RA  HR 45-55 20g RFA

## 2020-06-11 NOTE — Progress Notes (Signed)
Cardiology Office Note:    Date:  06/11/2020   ID:  Holly Spence, DOB May 27, 1946, MRN [id number]  PCP:  Jim Like, NP  Cardiologist:  Garwin Brothers, MD   Referring MD: Paulina Fusi, MD    ASSESSMENT:    1. Benign essential hypertension   2. Mixed hyperlipidemia   3. Ascending aorta dilation (HCC)   4. Diabetes mellitus due to underlying condition with unspecified complications (HCC)    PLAN:    In order of problems listed above:  1. High degree AV block: EKG is revealed at the office the patient has high degree AV block. Patient is in wheelchair and asymptomatic. She denies any syncopal episodes. She tells me that her heart rate has been slow for the past 2 to 3 weeks. When she turns around she feels lightheaded times. It is hard to elicit today a history of dizziness or any such problems. I will give her the benefit of doubt and send her to the emergency room. She is not on any medications that can slow her heart rate down. 2. Essential hypertension: Blood pressure stable and diet was emphasized. 3. Mixed dyslipidemia: Patient is on appropriate therapy. 4. Morbid obesity: Weight reduction was stressed and she promises to do better. Further recommendations will be made by our colleagues as to decisions about permanent pacing.   Medication Adjustments/Labs and Tests Ordered: Current medicines are reviewed at length with the patient today.  Concerns regarding medicines are outlined above.  No orders of the defined types were placed in this encounter.  No orders of the defined types were placed in this encounter.    No chief complaint on file.    History of Present Illness:    Holly Spence is a 74 y.o. female.  Patient has past medical history of essential hypertension dyslipidemia and morbid obesity.  She essentially is wheelchair bound with minimal activities at home.  She mentions to me that she was having issues with slow heart rate so she called her primary  care doctor who told her to come and see me.  She denies any history of syncope.  She tells me that when she turns she feels dizzy and lightheaded.  No orthopnea or PND.  At the time of my evaluation, the patient is alert awake oriented and in no distress.  Past Medical History:  Diagnosis Date   Arthritis    Back pain    Bruises easily    Contact lens/glasses fitting    Cough    Diabetes mellitus    Fatigue    Hyperlipidemia    Hypertension    Poor circulation    Sinus problem    Sleep apnea    SOB (shortness of breath)     Past Surgical History:  Procedure Laterality Date   CATARACT EXTRACTION     confirm date with patient   CHOLECYSTECTOMY  1987   EYE SURGERY  2003   retina   HERNIA REPAIR     LAPAROSCOPIC GASTRIC BANDING  03/22/10    Current Medications: Current Meds  Medication Sig   acetaminophen (TYLENOL) 325 MG tablet Take 650 mg by mouth every 6 (six) hours as needed.   albuterol (VENTOLIN HFA) 108 (90 Base) MCG/ACT inhaler Inhale 2 puffs into the lungs as needed.   Ascorbic Acid (VITA-C PO) Take 1 tablet by mouth daily.   diclofenac sodium (VOLTAREN) 1 % GEL APPLY 4 GRAMS AS DIRECTED TO KNEES EVERY 6 8 HOURS AS  NEEDED FOR PAIN   insulin NPH-regular Human (70-30) 100 UNIT/ML injection Inject into the skin.   ipratropium-albuterol (DUONEB) 0.5-2.5 (3) MG/3ML SOLN Take 3 mLs by nebulization 4 (four) times daily.   Iron, Ferrous Sulfate, 325 (65 Fe) MG TABS Take 1 tablet by mouth daily.   lisinopril-hydrochlorothiazide (ZESTORETIC) 10-12.5 MG tablet Take 1 tablet by mouth daily.   loratadine (CLARITIN) 10 MG tablet Take 10 mg by mouth daily.   lovastatin (MEVACOR) 20 MG tablet Take 1 tablet by mouth daily.   montelukast (SINGULAIR) 10 MG tablet Take 1 tablet by mouth daily.   ondansetron (ZOFRAN-ODT) 4 MG disintegrating tablet as needed.   pantoprazole (PROTONIX) 40 MG tablet Take 40 mg by mouth daily.   rOPINIRole (REQUIP) 4 MG  tablet Take 1 tablet by mouth daily.    traMADol (ULTRAM) 50 MG tablet Take 2 tablets by mouth 2 (two) times a day.   Vitamin D, Ergocalciferol, (DRISDOL) 1.25 MG (50000 UNIT) CAPS capsule Take 1 capsule by mouth once a week.     Allergies:   Penicillins, Sulfa antibiotics, Erythromycin, and Ciprofloxacin hcl   Social History   Socioeconomic History   Marital status: Married    Spouse name: Not on file   Number of children: Not on file   Years of education: Not on file   Highest education level: Not on file  Occupational History   Not on file  Tobacco Use   Smoking status: Never Smoker   Smokeless tobacco: Never Used  Substance and Sexual Activity   Alcohol use: No   Drug use: No   Sexual activity: Not on file  Other Topics Concern   Not on file  Social History Narrative   Not on file   Social Determinants of Health   Financial Resource Strain:    Difficulty of Paying Living Expenses: Not on file  Food Insecurity:    Worried About Running Out of Food in the Last Year: Not on file   Ran Out of Food in the Last Year: Not on file  Transportation Needs:    Lack of Transportation (Medical): Not on file   Lack of Transportation (Non-Medical): Not on file  Physical Activity:    Days of Exercise per Week: Not on file   Minutes of Exercise per Session: Not on file  Stress:    Feeling of Stress : Not on file  Social Connections:    Frequency of Communication with Friends and Family: Not on file   Frequency of Social Gatherings with Friends and Family: Not on file   Attends Religious Services: Not on file   Active Member of Clubs or Organizations: Not on file   Attends Banker Meetings: Not on file   Marital Status: Not on file     Family History: The patient's family history includes Arthritis in her mother; Diabetes in her father and sister; Heart disease in her father and mother; Hypertension in her mother.  ROS:   Please see  the history of present illness.    All other systems reviewed and are negative.  EKGs/Labs/Other Studies Reviewed:    The following studies were reviewed today: EKG suggestive of high degree AV block   Recent Labs: No results found for requested labs within last 8760 hours.  Recent Lipid Panel No results found for: CHOL, TRIG, HDL, CHOLHDL, VLDL, LDLCALC, LDLDIRECT  Physical Exam:    VS:  BP (!) 145/41    Pulse (!) 51    Ht 5'  3" (1.6 m)    Wt (!) 317 lb 3.2 oz (143.9 kg)    SpO2 93%    BMI 56.19 kg/m     Wt Readings from Last 3 Encounters:  06/11/20 (!) 317 lb 3.2 oz (143.9 kg)  03/18/20 (!) 310 lb 3.2 oz (140.7 kg)  02/20/19 291 lb (132 kg)     GEN: Patient is in no acute distress HEENT: Normal NECK: No JVD; No carotid bruits LYMPHATICS: No lymphadenopathy CARDIAC: Hear sounds regular, 2/6 systolic murmur at the apex. RESPIRATORY:  Clear to auscultation without rales, wheezing or rhonchi  ABDOMEN: Soft, non-tender, non-distended MUSCULOSKELETAL:  No edema; No deformity  SKIN: Warm and dry NEUROLOGIC:  Alert and oriented x 3 PSYCHIATRIC:  Normal affect   Signed, Garwin Brothers, MD  06/11/2020 10:51 AM    [facility]

## 2020-06-11 NOTE — Consult Note (Signed)
Cardiology Consultation:   Patient ID: Holly Spence MRN: [id number]; DOB: Feb 07, 1946  Admit date: 06/11/2020 Date of Consult: 06/11/2020  Primary Care Provider: Jim Like, NP [facility] Cardiologist: Dr. [facility] Electrophysiologist:  None    Patient Profile:   Holly Spence is a 74 y.o. female with a hx of morbid obesity (h/o lap band many years ago), hiatal hernia, chronic bronchitis, asthma (quit smoking 2001), self described as essentially wheelchair bound 2/2 terrible back problems, HTN, HLD, DM who is being seen today for the evaluation of advanced heart block at the request of Malachi Carl, PA.  History of Present Illness:   Holly Spence called Dr. Hulan Amato office yesterday with observation of her HR being slow, 40's-50's, she was seen in his office today.  She was found in an advanced heart block, no overt symptomatology could be elicited.  She had noted slow HRs for a few weeks. She is noted essentially wheelchair bound, mentioned when she would turn around she would feel lightheaded.   Her BP at his visit 145/41, HR 51 She was taking no potential nodal blocking agents and was referred to [facility] via EMS.  LABS  Pending   The patient tells me that for about 63mo she has had "terrible dizziness", though only when in bed and turning over and says "everything just starts to spin"  She is mostly seated, and essentially wheelchair bound, but states that she does ambulate to the bathroom or sometimes the kitchen.  Occasionally when she stands she will feel like she leans/or tips forward, though able to gain her balance back. None of this does she feel like she is weak, near syncopal, she has never fainted. She has a chronic cough, no unusual SOB, or increase in this. She had yesterday a vague ache to her chest both right and left, no associated symptoms, just "came and went". She tells me that she has checked her BP and HR every day for a long time and about 3 days  ago noted that her usual HR 90-100's suddenly was 50's, some 40's.  No particular symptoms except nauseous that yesterday the Zofran seemed to help, but today did not.  Appetite is unusually low, no vomiting No symptoms of illness, fever, no COVID exposures, says she never leaves the house.    Past Medical History:  Diagnosis Date  . Arthritis   . Back pain   . Bruises easily   . Contact lens/glasses fitting   . Cough   . Diabetes mellitus   . Fatigue   . Hyperlipidemia   . Hypertension   . Poor circulation   . Sinus problem   . Sleep apnea   . SOB (shortness of breath)     Past Surgical History:  Procedure Laterality Date  . CATARACT EXTRACTION     confirm date with patient  . CHOLECYSTECTOMY  1987  . EYE SURGERY  2003   retina  . HERNIA REPAIR    . LAPAROSCOPIC GASTRIC BANDING  03/22/10     Home Medications:  Prior to Admission medications   Medication Sig Start Date End Date Taking? Authorizing Provider  acetaminophen (TYLENOL) 325 MG tablet Take 650 mg by mouth every 6 (six) hours as needed.    [provider]  albuterol (VENTOLIN HFA) 108 (90 Base) MCG/ACT inhaler Inhale 2 puffs into the lungs as needed. 04/02/20   [provider]  Ascorbic Acid (VITA-C PO) Take 1 tablet by mouth daily.  [provider]  diclofenac sodium (VOLTAREN) 1 % GEL APPLY 4 GRAMS AS DIRECTED TO KNEES EVERY 6 8 HOURS AS NEEDED FOR PAIN 10/23/18   [provider]  insulin NPH-regular Human (70-30) 100 UNIT/ML injection Inject into the skin.    [provider]  ipratropium-albuterol (DUONEB) 0.5-2.5 (3) MG/3ML SOLN Take 3 mLs by nebulization 4 (four) times daily. 01/22/20   [provider]  Iron, Ferrous Sulfate, 325 (65 Fe) MG TABS Take 1 tablet by mouth daily.    [provider]  lisinopril-hydrochlorothiazide (ZESTORETIC) 10-12.5 MG tablet Take 1 tablet by mouth daily. 01/14/20   [provider]  loratadine (CLARITIN) 10 MG  tablet Take 10 mg by mouth daily.    [provider]  lovastatin (MEVACOR) 20 MG tablet Take 1 tablet by mouth daily. 08/30/18   [provider]  montelukast (SINGULAIR) 10 MG tablet Take 1 tablet by mouth daily. 08/30/18   [provider]  ondansetron (ZOFRAN-ODT) 4 MG disintegrating tablet as needed. 06/24/19   [provider]  pantoprazole (PROTONIX) 40 MG tablet Take 40 mg by mouth daily. 06/08/19   [provider]  rOPINIRole (REQUIP) 4 MG tablet Take 1 tablet by mouth daily.  10/22/18   [provider]  traMADol (ULTRAM) 50 MG tablet Take 2 tablets by mouth 2 (two) times a day. 12/14/14   [provider]  Vitamin D, Ergocalciferol, (DRISDOL) 1.25 MG (50000 UNIT) CAPS capsule Take 1 capsule by mouth once a week. 01/14/20   [provider]    Inpatient Medications: Scheduled Meds:  Continuous Infusions:  PRN Meds:   Allergies:    Allergies  Allergen Reactions  . Penicillins Hives    At injection site only.  . Sulfa Antibiotics Hives  . Erythromycin Other (See Comments)  . Ciprofloxacin Hcl     Other reaction(s): Abdominal Pain    Social History:   Social History   Socioeconomic History  . Marital status: Married    Spouse name: Not on file  . Number of children: Not on file  . Years of education: Not on file  . Highest education level: Not on file  Occupational History  . Not on file  Tobacco Use  . Smoking status: Never Smoker  . Smokeless tobacco: Never Used  Substance and Sexual Activity  . Alcohol use: No  . Drug use: No  . Sexual activity: Not on file  Other Topics Concern  . Not on file  Social History Narrative  . Not on file   Social Determinants of Health   Financial Resource Strain:   . Difficulty of Paying Living Expenses: Not on file  Food Insecurity:   . Worried About Programme researcher, broadcasting/film/video in the Last Year: Not on file  . Ran Out of Food in the Last Year: Not on file    Transportation Needs:   . Lack of Transportation (Medical): Not on file  . Lack of Transportation (Non-Medical): Not on file  Physical Activity:   . Days of Exercise per Week: Not on file  . Minutes of Exercise per Session: Not on file  Stress:   . Feeling of Stress : Not on file  Social Connections:   . Frequency of Communication with Friends and Family: Not on file  . Frequency of Social Gatherings with Friends and Family: Not on file  . Attends Religious Services: Not on file  . Active Member of Clubs or Organizations: Not on file  . Attends Club  or Organization Meetings: Not on file  . Marital Status: Not on file  Intimate Partner Violence:   . Fear of Current or Ex-Partner: Not on file  . Emotionally Abused: Not on file  . Physically Abused: Not on file  . Sexually Abused: Not on file    Family History:   Family History  Problem Relation Age of Onset  . Arthritis Mother   . Heart disease Mother   . Hypertension Mother   . Heart disease Father   . Diabetes Father   . Diabetes Sister      ROS:  Please see the history of present illness.  All other ROS reviewed and negative.     Physical Exam/Data:   Vitals:   06/11/20 1223 06/11/20 1230 06/11/20 1236 06/11/20 1237  BP:  (!) 150/136    Pulse:   (!) 40   Resp:  (!) 24    Temp:   99.2 F (37.3 C)   TempSrc:   Oral   SpO2:    94%  Weight: (!) 143.9 kg     Height: 5\' 3"  (1.6 m)      No intake or output data in the 24 hours ending 06/11/20 1251 Last 3 Weights 06/11/2020 06/11/2020 03/18/2020  Weight (lbs) 317 lb 3.9 oz 317 lb 3.2 oz 310 lb 3.2 oz  Weight (kg) 143.9 kg 143.881 kg 140.706 kg     Body mass index is 56.2 kg/m.  General:  Well nourished, well developed, in no acute distress HEENT: normal Lymph: no adenopathy Neck: JVD is difficult to assess Endocrine:  No thryomegaly Vascular: No carotid bruits Cardiac:  RRR; bradycardic, no murmurs, gallops or rubs Lungs:  CTA b/l, no wheezing, rhonchi or rales   Abd: soft, nontender, extremely large pannus Ext: 1+ edema Musculoskeletal:  No deformities Skin: warm and dry  Neuro:  no focal abnormalities noted Psych:  Normal affect   EKG:  The EKG was personally reviewed and demonstrates:    Both are difficult to see underlying A rate, looks about 100, V rate 51, 41, both look CHB with fairly narrow QRS and morphology unchanged from old EKGs  Telemetry:  Telemetry was personally reviewed and demonstrates:   CHB 40's     Relevant CV Studies:  04/20/2020: CT chest read includes (not entirely noted below) Heart is normal in size, no pericardial effusion.  Mild fusiform ectasia of the ascending Ao with ,ax diameter of 3.8cm, unchanged. Stable scattered atherosclerotic calcifications Stable extesnive 3-vessel coronary calcifications    12/05/2018: TTE IMPRESSIONS  1. The left ventricle has normal systolic function with an ejection  fraction of 60-65%. The cavity size was normal. There is mildly increased  left ventricular wall thickness. Left ventricular diastolic parameters  were normal.  2. The right ventricle has normal systolic function. The cavity was  normal. There is no increase in right ventricular wall thickness.  3. The tricuspid valve is grossly normal.  4. The aortic valve was not well visualized. Aortic valve regurgitation  was not assessed by color flow Doppler.  5. There is mild dilatation of the ascending aorta measuring 38 mm.     Laboratory Data:  High Sensitivity Troponin:  No results for input(s): TROPONINIHS in the last 720 hours.   ChemistryNo results for input(s): NA, K, CL, CO2, GLUCOSE, BUN, CREATININE, CALCIUM, GFRNONAA, GFRAA, ANIONGAP in the last 168 hours.  No results for input(s): PROT, ALBUMIN, AST, ALT, ALKPHOS, BILITOT in the last 168 hours. HematologyNo results for input(s): WBC,  RBC, HGB, HCT, MCV, MCH, MCHC, RDW, PLT in the last 168 hours. BNPNo results for input(s): BNP, PROBNP in the last 168  hours.  DDimer No results for input(s): DDIMER in the last 168 hours.   Radiology/Studies:  No results found.   Assessment and Plan:   1. CHB     No clear overt symptoms     Her dizziness sounds more of a vertigo symptom     No near syncope or syncope.  Home med list reviewed, no potential nodal blocking agents Labs are pending, will add TSH  I suspect that she will need pacing, I discussed this with her pending labs She is agreeable if no reversible causes are found. She is asymptomatic currently  Dr. Lalla Brothers will see her   { For questions or updates, please contact [facility] Please consult [email or web address] for contact info under    Signed, Sheilah Pigeon, PA-C  06/11/2020 12:51 PM

## 2020-06-11 NOTE — Telephone Encounter (Signed)
     I went in pt's chart to see who pt had talked to yesterday. The call was transferred to [facility].

## 2020-06-11 NOTE — ED Provider Notes (Signed)
P/w bradycardia. Went to [facility] clinic with possible 2:1 block, possibly symptomatic for past 2 weeks with dizzy, weaknes  Plan: -Likely cards admit for bradycardia/pacemaker  Physical Exam  BP 139/65   Pulse (!) 35   Temp 99.2 F (37.3 C) (Oral)   Resp 18   Ht 5\' 3"  (1.6 m)   Wt (!) 143.9 kg   SpO2 95%   BMI 56.20 kg/m     ED Course/Procedures     Procedures  MDM  Per cardiology, will plan to admit to place a pacemaker later today.  Admitted in stable condition.       , MD 06/12/20 14/04/21    [id number], MD 06/12/20 1737

## 2020-06-11 NOTE — Progress Notes (Signed)
  Echocardiogram 2D Echocardiogram has been performed.  Holly Spence 06/11/2020, 3:38 PM

## 2020-06-12 ENCOUNTER — Ambulatory Visit (HOSPITAL_COMMUNITY): Payer: Medicare Other

## 2020-06-12 DIAGNOSIS — Z880 Allergy status to penicillin: Secondary | ICD-10-CM | POA: Diagnosis not present

## 2020-06-12 DIAGNOSIS — E785 Hyperlipidemia, unspecified: Secondary | ICD-10-CM | POA: Diagnosis not present

## 2020-06-12 DIAGNOSIS — Z45010 Encounter for checking and testing of cardiac pacemaker pulse generator [battery]: Secondary | ICD-10-CM | POA: Diagnosis not present

## 2020-06-12 DIAGNOSIS — J45909 Unspecified asthma, uncomplicated: Secondary | ICD-10-CM | POA: Diagnosis not present

## 2020-06-12 DIAGNOSIS — Z9884 Bariatric surgery status: Secondary | ICD-10-CM | POA: Diagnosis not present

## 2020-06-12 DIAGNOSIS — K219 Gastro-esophageal reflux disease without esophagitis: Secondary | ICD-10-CM | POA: Diagnosis not present

## 2020-06-12 DIAGNOSIS — M1990 Unspecified osteoarthritis, unspecified site: Secondary | ICD-10-CM | POA: Diagnosis not present

## 2020-06-12 DIAGNOSIS — Z6841 Body Mass Index (BMI) 40.0 and over, adult: Secondary | ICD-10-CM | POA: Diagnosis not present

## 2020-06-12 DIAGNOSIS — I442 Atrioventricular block, complete: Secondary | ICD-10-CM

## 2020-06-12 DIAGNOSIS — G4730 Sleep apnea, unspecified: Secondary | ICD-10-CM | POA: Diagnosis not present

## 2020-06-12 DIAGNOSIS — E119 Type 2 diabetes mellitus without complications: Secondary | ICD-10-CM | POA: Diagnosis not present

## 2020-06-12 DIAGNOSIS — Z79899 Other long term (current) drug therapy: Secondary | ICD-10-CM | POA: Diagnosis not present

## 2020-06-12 DIAGNOSIS — Z882 Allergy status to sulfonamides status: Secondary | ICD-10-CM | POA: Diagnosis not present

## 2020-06-12 DIAGNOSIS — R001 Bradycardia, unspecified: Secondary | ICD-10-CM | POA: Diagnosis not present

## 2020-06-12 DIAGNOSIS — Z8249 Family history of ischemic heart disease and other diseases of the circulatory system: Secondary | ICD-10-CM | POA: Diagnosis not present

## 2020-06-12 DIAGNOSIS — Z8261 Family history of arthritis: Secondary | ICD-10-CM | POA: Diagnosis not present

## 2020-06-12 DIAGNOSIS — Z20822 Contact with and (suspected) exposure to covid-19: Secondary | ICD-10-CM | POA: Diagnosis not present

## 2020-06-12 DIAGNOSIS — Z833 Family history of diabetes mellitus: Secondary | ICD-10-CM | POA: Diagnosis not present

## 2020-06-12 DIAGNOSIS — Z993 Dependence on wheelchair: Secondary | ICD-10-CM | POA: Diagnosis not present

## 2020-06-12 DIAGNOSIS — Z794 Long term (current) use of insulin: Secondary | ICD-10-CM | POA: Diagnosis not present

## 2020-06-12 DIAGNOSIS — I10 Essential (primary) hypertension: Secondary | ICD-10-CM | POA: Diagnosis not present

## 2020-06-12 DIAGNOSIS — Z950 Presence of cardiac pacemaker: Secondary | ICD-10-CM | POA: Diagnosis not present

## 2020-06-12 DIAGNOSIS — Z87891 Personal history of nicotine dependence: Secondary | ICD-10-CM | POA: Diagnosis not present

## 2020-06-12 DIAGNOSIS — Z881 Allergy status to other antibiotic agents status: Secondary | ICD-10-CM | POA: Diagnosis not present

## 2020-06-12 LAB — GLUCOSE, CAPILLARY
Glucose-Capillary: 277 mg/dL — ABNORMAL HIGH (ref 70–99)
Glucose-Capillary: 306 mg/dL — ABNORMAL HIGH (ref 70–99)

## 2020-06-12 LAB — BASIC METABOLIC PANEL
Anion gap: 8 (ref 5–15)
BUN: 32 mg/dL — ABNORMAL HIGH (ref 8–23)
CO2: 28 mmol/L (ref 22–32)
Calcium: 8.9 mg/dL (ref 8.9–10.3)
Chloride: 101 mmol/L (ref 98–111)
Creatinine, Ser: 1.3 mg/dL — ABNORMAL HIGH (ref 0.44–1.00)
GFR, Estimated: 43 mL/min — ABNORMAL LOW (ref >60)
Glucose, Bld: 328 mg/dL — ABNORMAL HIGH (ref 70–99)
Potassium: 5.3 mmol/L — ABNORMAL HIGH (ref 3.5–5.1)
Sodium: 137 mmol/L (ref 135–145)

## 2020-06-12 NOTE — Progress Notes (Deleted)
Patient's BP was 183/110 at 1220. Gave PRN Hyrdralazine. BP now 93/54. MD notified. Will continue to monitor.

## 2020-06-12 NOTE — Discharge Instructions (Signed)
    Supplemental Discharge Instructions for  Pacemaker/Defibrillator Patients    Activity No heavy lifting or vigorous activity with your left/right arm for 6 to 8 weeks.  Do not raise your left/right arm above your head for one week.  Gradually raise your affected arm as drawn below.             06/15/2020                06/16/2020                 06/17/2020             12/10/201 __  NO DRIVING for  1 week   ; you may begin driving on   56/25/6389  .  WOUND CARE - Keep the wound area clean and dry.  Do not get this area wet , no showers for one week; you may shower on  06/18/2020 . - The tape/steri-strips on your wound will fall off; do not pull them off.  No bandage is needed on the site.  DO  NOT apply any creams, oils, or ointments to the wound area. - If you notice any drainage or discharge from the wound, any swelling or bruising at the site, or you develop a fever > 101? F after you are discharged home, call the office at once.  Special Instructions - You are still able to use cellular telephones; use the ear opposite the side where you have your pacemaker/defibrillator.  Avoid carrying your cellular phone near your device. - When traveling through airports, show security personnel your identification card to avoid being screened in the metal detectors.  Ask the security personnel to use the hand wand. - Avoid arc welding equipment, MRI testing (magnetic resonance imaging), TENS units (transcutaneous nerve stimulators).  Call the office for questions about other devices. - Avoid electrical appliances that are in poor condition or are not properly grounded. - Microwave ovens are safe to be near or to operate.   Please follow-up with your endocrinologist to improve your diabetes control

## 2020-06-12 NOTE — Plan of Care (Signed)
  Problem: Education: Goal: Knowledge of cardiac device and self-care will improve Outcome: Adequate for Discharge Goal: Ability to safely manage health related needs after discharge will improve Outcome: Adequate for Discharge Goal: Individualized Educational Video(s) Outcome: Adequate for Discharge   Problem: Cardiac: Goal: Ability to achieve and maintain adequate cardiopulmonary perfusion will improve Outcome: Adequate for Discharge   Problem: Education: Goal: Knowledge of General Education information will improve Description: Including pain rating scale, medication(s)/side effects and non-pharmacologic comfort measures Outcome: Adequate for Discharge   Problem: Health Behavior/Discharge Planning: Goal: Ability to manage health-related needs will improve Outcome: Adequate for Discharge   Problem: Clinical Measurements: Goal: Ability to maintain clinical measurements within normal limits will improve Outcome: Adequate for Discharge Goal: Will remain free from infection Outcome: Adequate for Discharge Goal: Diagnostic test results will improve Outcome: Adequate for Discharge Goal: Cardiovascular complication will be avoided Outcome: Adequate for Discharge   Problem: Activity: Goal: Risk for activity intolerance will decrease Outcome: Adequate for Discharge   Problem: Nutrition: Goal: Adequate nutrition will be maintained Outcome: Adequate for Discharge   Problem: Coping: Goal: Level of anxiety will decrease Outcome: Adequate for Discharge   Problem: Elimination: Goal: Will not experience complications related to bowel motility Outcome: Adequate for Discharge Goal: Will not experience complications related to urinary retention Outcome: Adequate for Discharge   Problem: Pain Managment: Goal: General experience of comfort will improve Outcome: Adequate for Discharge   Problem: Safety: Goal: Ability to remain free from injury will improve Outcome: Adequate for  Discharge   Problem: Skin Integrity: Goal: Risk for impaired skin integrity will decrease Outcome: Adequate for Discharge

## 2020-06-12 NOTE — Progress Notes (Deleted)
Pt transported to [facility] via [facility]

## 2020-06-12 NOTE — Progress Notes (Deleted)
Patient noncompliant with cardiac monitor. Educated on necessity. He said, "I can take it off if I want." Notified CCMD to put him on standby. Notified doctor. Will continue to monitor.

## 2020-06-12 NOTE — Progress Notes (Deleted)
Called report to [facility].  Informed family pt to be transferred to room 11.

## 2020-06-12 NOTE — Discharge Summary (Signed)
Discharge Summary    Patient ID: Holly Spence MRN: [id number]; DOB: 02/06/1946  Admit date: 06/11/2020 Discharge date: 06/12/2020  Primary Care Provider: Jim Like, NP  Primary Cardiologist: Garwin Brothers, MD  Primary Electrophysiologist:  Lanier Prude, MD   Discharge Diagnoses    Active Problems:   Symptomatic bradycardia   Complete heart block [facility])    Diagnostic Studies/Procedures    Echocardiogram 06/11/20: 1. Technically difficult echo with poor image quality.  2. Left ventricular ejection fraction, by estimation, is 55 to 60%. The  left ventricle has normal function. The left ventricle has no regional  wall motion abnormalities. Left ventricular diastolic parameters are  indeterminate.  3. Right ventricular systolic function is normal. The right ventricular  size is normal.  4. The mitral valve is normal in structure. No evidence of mitral valve  regurgitation.  5. The aortic valve is normal in structure. Aortic valve regurgitation is  not visualized.   Pacemaker implant 06/11/20: 1.  Complete heart block  2. Successful St. Jude dual chamber permanent pacemaker implantation  3.  No early apparent complications.  _____________   History of Present Illness     Holly Spence is a 74 y.o. female with a PMH of HTN, HLD, DM type 2, COPD, chronic back pain, and morbid obesity who presented to [facility] ED with symptomatic bradycardia.   Holly Spence called Dr. Hulan Amato office 12/2/21with observation of her HR being slow, 40's-50's, she was seen in his office 06/11/20.  She was found in an advanced heart block, no overt symptomatology could be elicited.  She had noted slow HRs for a few weeks. She is noted essentially wheelchair bound, mentioned when she would turn around she would feel lightheaded.   Her BP at his visit 145/41, HR 51 She was taking no potential nodal blocking agents and was referred to [facility] via EMS.  LABS  Pending at the time of  admission  The patient reported that for about 102mo she has had "terrible dizziness", though only when in bed and turning over and says "everything just starts to spin"  She is mostly seated, and essentially wheelchair bound, but states that she does ambulate to the bathroom or sometimes the kitchen.  Occasionally when she stands she will feel like she leans/or tips forward, though able to gain her balance back. She denied weakness, near syncope, and she has never fainted. She has a chronic cough, no unusual SOB, or increase in this. She had yesterday a vague ache to her chest both right and left, no associated symptoms, just "came and went". She tells me that she has checked her BP and HR every day for a long time and about 3 days ago noted that her usual HR 90-100's suddenly was 50's, some 40's.  No particular symptoms except nauseous that yesterday the Zofran seemed to help, but today did not.  Appetite is unusually low, no vomiting No symptoms of illness, fever, no COVID exposures, says she never leaves the house.   Hospital Course     Consultants: None   1. CHB: patient reported bradycardia with HR in the 40s-50s. On outpatient evaluation by Dr. Tomie China, she was found to be in CHB on EKG and referred to [facility] via EMS for EP evaluation. She was not on any AV nodal blocking agents. Echo showed EF 55-60%, indeterminate LV function, no RWMA, and no significant valvular abnormalities. Decision made to pursue PPM placement. Patient had St. Jude device placed without complication.  CXR the following morning good PPM lead placement and no evidence of PTX.  - Wound clinic appointment scheduled for 06/24/20 - She will follow-up with Dr. Lalla BrothersLambert 09/2020  2. HTN: BP intermittently elevated - Continue home lisinopril-HCTZ and lasix  3. DM type 2: A1C 9.3; poorly controlled - Encouraged follow-up with her endocrinologist to improve DM control - Home medications continued.   4. HLD:   - Continued home  statin   Did the patient have an acute coronary syndrome (MI, NSTEMI, STEMI, etc) this admission?:  No                               Did the patient have a percutaneous coronary intervention (stent / angioplasty)?:  No.       _____________  Discharge Vitals Blood pressure (!) 130/50, pulse 75, temperature (!) 97.3 F (36.3 C), temperature source Oral, resp. rate 20, height 5' 2.99" (1.6 m), weight (!) 143.5 kg, SpO2 93 %.  Filed Weights   06/11/20 1223 06/11/20 1827 06/12/20 0027  Weight: (!) 143.9 kg (!) 144.6 kg (!) 143.5 kg    Labs & Radiologic Studies    CBC Recent Labs    06/11/20 1345  WBC 9.6  NEUTROABS 6.9  HGB 13.0  HCT 43.6  MCV 97.8  PLT 172   Basic Metabolic Panel Recent Labs    40/98/1110/09/28 1345 06/12/20 0328  NA 138 137  K 5.8* 5.3*  CL 102 101  CO2 25 28  GLUCOSE 273* 328*  BUN 43* 32*  CREATININE 1.58* 1.30*  CALCIUM 8.3* 8.9   Liver Function Tests No results for input(s): AST, ALT, ALKPHOS, BILITOT, PROT, ALBUMIN in the last 72 hours. No results for input(s): LIPASE, AMYLASE in the last 72 hours. High Sensitivity Troponin:   No results for input(s): TROPONINIHS in the last 720 hours.  BNP Invalid input(s): POCBNP D-Dimer No results for input(s): DDIMER in the last 72 hours. Hemoglobin A1C Recent Labs    06/11/20 1345  HGBA1C 9.3*   Fasting Lipid Panel No results for input(s): CHOL, HDL, LDLCALC, TRIG, CHOLHDL, LDLDIRECT in the last 72 hours. Thyroid Function Tests Recent Labs    06/11/20 1355  TSH 1.226   _____________  DG Chest 2 View  Result Date: 06/12/2020 CLINICAL DATA:  Pacemaker placement yesterday. Evaluate for lead location. EXAM: CHEST - 2 VIEW COMPARISON:  06/12/2020 FINDINGS: Repeat imaging performed to confirm lead placement which was not clear on the study earlier today. Left subclavian transvenous pacemaker is in place with leads in the right atrium and right ventricle in good position. No pneumothorax. Heart size and  vascularity normal. Negative for heart failure. Lungs are clear. IMPRESSION: Repeat imaging confirms good pacemaker lead placement in the right atrium and right ventricle. No complication. Electronically Signed   By: Marlan Palauharles  Clark M.D.   On: 06/12/2020 10:41   DG Chest 2 View  Result Date: 06/12/2020 CLINICAL DATA:  Status post pacemaker placement EXAM: CHEST - 2 VIEW COMPARISON:  06/11/2020 FINDINGS: Cardiac shadow is enlarged. New pacing device is seen in satisfactory position. No pneumothorax is noted. The lungs are clear. No bony abnormality is noted. IMPRESSION: No pneumothorax following pacemaker placement. Electronically Signed   By: Alcide CleverMark  Lukens M.D.   On: 06/12/2020 08:36   DG Chest Portable 1 View  Result Date: 06/11/2020 CLINICAL DATA:  weaknessweakness EXAM: PORTABLE CHEST 1 VIEW COMPARISON:  07/06/2016 FINDINGS: External pads overlie the LEFT hemithorax.  Stable cardiac silhouette. No effusion, infiltrate or pneumothorax. No acute osseous abnormality. IMPRESSION: No acute cardiopulmonary process. Electronically Signed   By: Genevive Bi M.D.   On: 06/11/2020 13:12   ECHOCARDIOGRAM COMPLETE  Result Date: 06/11/2020    ECHOCARDIOGRAM REPORT   Patient Name:   Holly Spence Date of Exam: 06/11/2020 Medical Rec #:  [id number]      Height:       63.0 in Accession #:    [id number]     Weight:       317.2 lb Date of Birth:  09-Aug-1945      BSA:          2.352 m Patient Age:    74 years       BP:           115/55 mmHg Patient Gender: F              HR:           36 bpm. Exam Location:  Inpatient Procedure: 2D Echo, Cardiac Doppler, Color Doppler and Intracardiac            Opacification Agent STAT ECHO Indications:    Abnormal ECG 794.31 / R94.31  History:        Patient has prior history of Echocardiogram examinations, most                 recent 12/05/2018. Signs/Symptoms:Shortness of Breath; Risk                 Factors:Sleep Apnea, Hypertension, Diabetes, Dyslipidemia and                  Non-Smoker.  Sonographer:    Renella Cunas RDCS Referring Phys: [id number] RENEE LYNN URSUY IMPRESSIONS  1. Technically difficult echo with poor image quality.  2. Left ventricular ejection fraction, by estimation, is 55 to 60%. The left ventricle has normal function. The left ventricle has no regional wall motion abnormalities. Left ventricular diastolic parameters are indeterminate.  3. Right ventricular systolic function is normal. The right ventricular size is normal.  4. The mitral valve is normal in structure. No evidence of mitral valve regurgitation.  5. The aortic valve is normal in structure. Aortic valve regurgitation is not visualized. FINDINGS  Left Ventricle: Left ventricular ejection fraction, by estimation, is 55 to 60%. The left ventricle has normal function. The left ventricle has no regional wall motion abnormalities. Definity contrast agent was given IV to delineate the left ventricular  endocardial borders. The left ventricular internal cavity size was normal in size. There is no left ventricular hypertrophy. Left ventricular diastolic parameters are indeterminate. Right Ventricle: The right ventricular size is normal. No increase in right ventricular wall thickness. Right ventricular systolic function is normal. Left Atrium: Left atrial size was normal in size. Right Atrium: Right atrial size was normal in size. Pericardium: There is no evidence of pericardial effusion. Mitral Valve: The mitral valve is normal in structure. No evidence of mitral valve regurgitation. Tricuspid Valve: The tricuspid valve is grossly normal. Tricuspid valve regurgitation is not demonstrated. Aortic Valve: The aortic valve is normal in structure. Aortic valve regurgitation is not visualized. Pulmonic Valve: The pulmonic valve was not well visualized. Pulmonic valve regurgitation is not visualized. Aorta: The aortic root and ascending aorta are structurally normal, with no evidence of dilitation. IAS/Shunts: The atrial  septum is grossly normal. Additional Comments: Technically difficult echo with poor image quality.  LEFT VENTRICLE PLAX 2D LVIDd:  5.10 cm LVIDs:         3.60 cm LV PW:         0.90 cm LV IVS:        0.90 cm LVOT diam:     2.10 cm LV SV:         84 LV SV Index:   36 LVOT Area:     3.46 cm  LV Volumes (MOD) LV vol d, MOD A2C: 119.0 ml LV vol d, MOD A4C: 133.0 ml LV vol s, MOD A2C: 41.7 ml LV vol s, MOD A4C: 34.4 ml LV SV MOD A2C:     77.3 ml LV SV MOD A4C:     133.0 ml LV SV MOD BP:      89.6 ml RIGHT VENTRICLE TAPSE (M-mode): 2.1 cm LEFT ATRIUM             Index       RIGHT ATRIUM           Index LA diam:        3.70 cm 1.57 cm/m  RA Area:     13.70 cm LA Vol (A2C):   41.4 ml 17.60 ml/m RA Volume:   30.40 ml  12.92 ml/m LA Vol (A4C):   45.3 ml 19.26 ml/m LA Biplane Vol: 44.3 ml 18.83 ml/m  AORTIC VALVE LVOT Vmax:   106.00 cm/s LVOT Vmean:  67.400 cm/s LVOT VTI:    0.243 m  AORTA Ao Root diam: 3.00 cm  SHUNTS Systemic VTI:  0.24 m Systemic Diam: 2.10 cm Kristeen Miss MD Electronically signed by Kristeen Miss MD Signature Date/Time: 06/11/2020/3:50:01 PM    Final    Disposition   Pt is being discharged home today in good condition.  Follow-up Plans & Appointments     Follow-up Information    [facility] Office Follow up.   Specialty: Cardiology Why: 06/24/2020 @ 4:00PM, wound check visit Contact information: [address], [address] [place] [address] [phone]       Lanier Prude, MD Follow up.   Specialties: Cardiology, Radiology Why: 09/14/2020 @ 1:45PM Contact information: [address] [place] [address] [phone]              Discharge Instructions    Diet - low sodium heart healthy   Complete by: As directed    Increase activity slowly   Complete by: As directed       Discharge Medications   Allergies as of 06/12/2020      Reactions   Penicillins Hives   At injection site only.   Sulfa Antibiotics  Hives   Erythromycin Other (See Comments)   Ciprofloxacin Hcl    Other reaction(s): Abdominal Pain      Medication List    TAKE these medications   acetaminophen 325 MG tablet Commonly known as: TYLENOL Take 650 mg by mouth in the morning and at bedtime.   albuterol 108 (90 Base) MCG/ACT inhaler Commonly known as: VENTOLIN HFA Inhale 2 puffs into the lungs every 6 (six) hours as needed for wheezing or shortness of breath.   diclofenac sodium 1 % Gel Commonly known as: VOLTAREN Apply 4 g topically every 6 (six) hours as needed (pain).   furosemide 40 MG tablet Commonly known as: LASIX Take 40 mg by mouth daily as needed for fluid or edema.   insulin NPH-regular Human (70-30) 100 UNIT/ML injection Inject 55-90 Units into the skin 2 (two) times daily  with a meal. Per sliding scale : not provided   ipratropium-albuterol 0.5-2.5 (3) MG/3ML Soln Commonly known as: DUONEB Take 3 mLs by nebulization 4 (four) times daily.   Iron (Ferrous Sulfate) 325 (65 Fe) MG Tabs Take 325 mg by mouth daily.   lisinopril-hydrochlorothiazide 10-12.5 MG tablet Commonly known as: ZESTORETIC Take 1 tablet by mouth daily.   loratadine 10 MG tablet Commonly known as: CLARITIN Take 10 mg by mouth daily.   lovastatin 20 MG tablet Commonly known as: MEVACOR Take 20 mg by mouth daily.   montelukast 10 MG tablet Commonly known as: SINGULAIR Take 10 mg by mouth daily.   ondansetron 4 MG disintegrating tablet Commonly known as: ZOFRAN-ODT Take 4 mg by mouth every 8 (eight) hours as needed for nausea or vomiting.   pantoprazole 40 MG tablet Commonly known as: PROTONIX Take 40 mg by mouth daily.   rOPINIRole 4 MG tablet Commonly known as: REQUIP Take 4 mg by mouth at bedtime.   traMADol 50 MG tablet Commonly known as: ULTRAM Take 100 mg by mouth 2 (two) times a day.   VITA-C PO Take 1 tablet by mouth daily.   Vitamin D (Ergocalciferol) 1.25 MG (50000 UNIT) Caps capsule Commonly known  as: DRISDOL Take 50,000 Units by mouth once a week. Sunday          Outstanding Labs/Studies   None  Duration of Discharge Encounter   Greater than 30 minutes including physician time.  Signed, Beatriz Stallion, PA-C 06/12/2020, 1:58 PM

## 2020-06-12 NOTE — Progress Notes (Signed)
Patient Name: Holly Spence      SUBJECTIVE:*without complaint  Just tired  No significant pain   Past Medical History:  Diagnosis Date  . Arthritis   . Back pain   . Bruises easily   . Contact lens/glasses fitting   . Cough   . Diabetes mellitus   . Fatigue   . Hyperlipidemia   . Hypertension   . Poor circulation   . Sinus problem   . Sleep apnea   . SOB (shortness of breath)     Scheduled Meds:  Scheduled Meds: . lisinopril  10 mg Oral Daily   Or  . hydrochlorothiazide  12.5 mg Oral Daily  . insulin aspart  0-20 Units Subcutaneous TID WC  . montelukast  10 mg Oral Daily  . pantoprazole  40 mg Oral Daily  . pravastatin  20 mg Oral q1800  . rOPINIRole  4 mg Oral QHS   Continuous Infusions: acetaminophen, ondansetron (ZOFRAN) IV, traMADol    PHYSICAL EXAM Vitals:   06/11/20 2010 06/12/20 0027 06/12/20 0405 06/12/20 0729  BP: (!) 144/47 140/62 132/65 126/65  Pulse: 100 81 (!) 105 97  Resp: 16 20 16 20   Temp: 98.1 F (36.7 C) 98.2 F (36.8 C) 97.7 F (36.5 C) 97.9 F (36.6 C)  TempSrc: Oral Oral Oral Oral  SpO2: 93% 94% 92% 92%  Weight:  (!) 143.5 kg    Height:        BP 126/65 (BP Location: Right Arm)   Pulse 97   Temp 97.9 F (36.6 C) (Oral)   Resp 20   Ht 5' 2.99" (1.6 m)   Wt (!) 143.5 kg   SpO2 92%   BMI 56.05 kg/m  Well developed and Morbidly obese in no acute distress HENT normal Neck supple  * Clear DevicePocket without hematoma, swelling or tenderness Regular rate and rhythm, no  gallop No murmur Abd-soft with active BS No Clubbing cyanosis   edema Skin-warm and dry A & Oriented  Grossly normal sensory and motor function  ECG  P-synchronous/ AV  pacing   TELEMETRY: Reviewed personnally pt in  P-synchronous/ AV  pacing       Intake/Output Summary (Last 24 hours) at 06/12/2020 0832 Last data filed at 06/12/2020 0700 Gross per 24 hour  Intake --  Output 2150 ml  Net -2150 ml    LABS: Basic Metabolic  Panel: Recent Labs  Lab 06/11/20 1345 06/12/20 0328  NA 138 137  K 5.8* 5.3*  CL 102 101  CO2 25 28  GLUCOSE 273* 328*  BUN 43* 32*  CREATININE 1.58* 1.30*  CALCIUM 8.3* 8.9   Cardiac Enzymes: No results for input(s): CKTOTAL, CKMB, CKMBINDEX, TROPONINI in the last 72 hours. CBC: Recent Labs  Lab 06/11/20 1345  WBC 9.6  NEUTROABS 6.9  HGB 13.0  HCT 43.6  MCV 97.8  PLT 172   PROTIME: No results for input(s): LABPROT, INR in the last 72 hours. Liver Function Tests: No results for input(s): AST, ALT, ALKPHOS, BILITOT, PROT, ALBUMIN in the last 72 hours. No results for input(s): LIPASE, AMYLASE in the last 72 hours. BNP: BNP (last 3 results) No results for input(s): BNP in the last 8760 hours.  ProBNP (last 3 results) No results for input(s): PROBNP in the last 8760 hours.  D-Dimer: No results for input(s): DDIMER in the last 72 hours. Hemoglobin A1C: Recent Labs    06/11/20 1345  HGBA1C 9.3*   Fasting Lipid Panel:  No results for input(s): CHOL, HDL, LDLCALC, TRIG, CHOLHDL, LDLDIRECT in the last 72 hours. Thyroid Function Tests: Recent Labs    06/11/20 1355  TSH 1.226   Anemia Panel: No results for input(s): VITAMINB12, FOLATE, FERRITIN, TIBC, IRON, RETICCTPCT in the last 72 hours.   Device Interrogation:normal device function  CXR>> unable on either view to identify with certainty the course of the atrial lead  [facility] and will [facility] films   ASSESSMENT AND PLAN:  Active Problems:   Symptomatic bradycardia   Complete heart block (HCC)  pacemaker  -acute implant-- need to visualize the atrial lead  Instructions given Mobilization demonstrated and shower in 7 days     Signed, Sherryl Manges MD  06/12/2020

## 2020-06-14 ENCOUNTER — Encounter (HOSPITAL_COMMUNITY): Payer: Self-pay | Admitting: Cardiology

## 2020-06-14 ENCOUNTER — Telehealth: Payer: Self-pay

## 2020-06-14 NOTE — Telephone Encounter (Signed)
The patient is going to call her son and to see if it is okay for her to come at 8am 06-22-20

## 2020-06-15 MED FILL — Cefazolin Sodium-Dextrose IV Solution 2 GM/100ML-4%: INTRAVENOUS | Qty: 100 | Status: AC

## 2020-06-17 ENCOUNTER — Telehealth: Payer: Self-pay | Admitting: Cardiology

## 2020-06-17 DIAGNOSIS — E559 Vitamin D deficiency, unspecified: Secondary | ICD-10-CM | POA: Diagnosis not present

## 2020-06-17 DIAGNOSIS — Z09 Encounter for follow-up examination after completed treatment for conditions other than malignant neoplasm: Secondary | ICD-10-CM | POA: Diagnosis not present

## 2020-06-17 DIAGNOSIS — Z9181 History of falling: Secondary | ICD-10-CM | POA: Diagnosis not present

## 2020-06-17 DIAGNOSIS — I442 Atrioventricular block, complete: Secondary | ICD-10-CM | POA: Diagnosis not present

## 2020-06-17 DIAGNOSIS — Z23 Encounter for immunization: Secondary | ICD-10-CM | POA: Diagnosis not present

## 2020-06-17 DIAGNOSIS — D509 Iron deficiency anemia, unspecified: Secondary | ICD-10-CM | POA: Diagnosis not present

## 2020-06-17 DIAGNOSIS — E1142 Type 2 diabetes mellitus with diabetic polyneuropathy: Secondary | ICD-10-CM | POA: Diagnosis not present

## 2020-06-17 NOTE — Telephone Encounter (Signed)
Returning patients phone.   Patient reports she wakes up often in the early morning. States two of the mornings since implant she has noticed a warmth in her chest/ quivering where device is. Denies any pain, swelling, drainage or fevers. Patient denies it waking her up out of sleep, states she is already awake. Advised patient this time frame is often while the device is completing testing.   Requested manual transmission. Presenting AS/VP 111 (rate is noted similar while in hospital), 1 AT event noted w/ controlled VR, duration 6 seconds. Results appear stable. Advised patient to assure she follows up with her wound/device check on 06/22/20. Verbalized understanding. Advised to call if worsen or new complaints.

## 2020-06-17 NOTE — Telephone Encounter (Signed)
Will send to device clinic as pt just had device implant 06/11/20

## 2020-06-17 NOTE — Telephone Encounter (Signed)
    Pt said for the last 2 mornings around 2 am she's been waking up due to quivering and warm feeling on her chest she wanted to know if that's normal feeling

## 2020-06-22 ENCOUNTER — Other Ambulatory Visit: Payer: Self-pay

## 2020-06-22 ENCOUNTER — Ambulatory Visit (INDEPENDENT_AMBULATORY_CARE_PROVIDER_SITE_OTHER): Payer: Medicare Other | Admitting: Emergency Medicine

## 2020-06-22 DIAGNOSIS — I442 Atrioventricular block, complete: Secondary | ICD-10-CM | POA: Diagnosis not present

## 2020-06-22 LAB — CUP PACEART INCLINIC DEVICE CHECK
Battery Remaining Longevity: 72 mo
Battery Voltage: 3.04 V
Brady Statistic RA Percent Paced: 0.12 %
Brady Statistic RV Percent Paced: 99 %
Date Time Interrogation Session: 20211214092631
Implantable Lead Implant Date: 20211203
Implantable Lead Implant Date: 20211203
Implantable Lead Location: 753859
Implantable Lead Location: 753860
Implantable Pulse Generator Implant Date: 20211203
Lead Channel Impedance Value: 462.5 Ohm
Lead Channel Impedance Value: 712.5 Ohm
Lead Channel Pacing Threshold Amplitude: 0.5 V
Lead Channel Pacing Threshold Amplitude: 1.25 V
Lead Channel Pacing Threshold Pulse Width: 0.5 ms
Lead Channel Pacing Threshold Pulse Width: 0.5 ms
Lead Channel Sensing Intrinsic Amplitude: 1.8 mV
Lead Channel Sensing Intrinsic Amplitude: 7 mV
Lead Channel Setting Pacing Amplitude: 3.5 V
Lead Channel Setting Pacing Amplitude: 3.5 V
Lead Channel Setting Pacing Pulse Width: 0.5 ms
Lead Channel Setting Sensing Sensitivity: 2 mV
Pulse Gen Model: 2272
Pulse Gen Serial Number: 3879909

## 2020-06-22 NOTE — Progress Notes (Addendum)
Pacemaker check in clinic. Normal device function. Thresholds, sensing, impedances consistent with previous measurements. Device programmed to maximize longevity. 1 AMS episode with EGMs that shows AT for 12 seconds. No high ventricular rates noted. Device programmed at appropriate safety margins. Histogram distribution appropriate for patient activity level. Device programmed to optimize intrinsic conduction. Estimated longevity  6 years. Patient enrolled in remote follow-up and next remote 09/13/20. Follow-up with Dr Lalla Brothers. Patient education completed.

## 2020-06-24 ENCOUNTER — Ambulatory Visit: Payer: Medicare Other

## 2020-07-20 DIAGNOSIS — G8929 Other chronic pain: Secondary | ICD-10-CM | POA: Diagnosis not present

## 2020-07-20 DIAGNOSIS — E785 Hyperlipidemia, unspecified: Secondary | ICD-10-CM | POA: Diagnosis not present

## 2020-07-20 DIAGNOSIS — K219 Gastro-esophageal reflux disease without esophagitis: Secondary | ICD-10-CM | POA: Diagnosis not present

## 2020-07-20 DIAGNOSIS — E559 Vitamin D deficiency, unspecified: Secondary | ICD-10-CM | POA: Diagnosis not present

## 2020-07-20 DIAGNOSIS — I10 Essential (primary) hypertension: Secondary | ICD-10-CM | POA: Diagnosis not present

## 2020-07-21 DIAGNOSIS — E785 Hyperlipidemia, unspecified: Secondary | ICD-10-CM | POA: Diagnosis not present

## 2020-07-21 DIAGNOSIS — E119 Type 2 diabetes mellitus without complications: Secondary | ICD-10-CM | POA: Diagnosis not present

## 2020-07-21 DIAGNOSIS — Z794 Long term (current) use of insulin: Secondary | ICD-10-CM | POA: Diagnosis not present

## 2020-09-13 ENCOUNTER — Ambulatory Visit (INDEPENDENT_AMBULATORY_CARE_PROVIDER_SITE_OTHER): Payer: Medicare Other

## 2020-09-13 DIAGNOSIS — I442 Atrioventricular block, complete: Secondary | ICD-10-CM

## 2020-09-13 LAB — CUP PACEART REMOTE DEVICE CHECK
Battery Remaining Longevity: 67 mo
Battery Remaining Percentage: 95.5 %
Battery Voltage: 3.01 V
Brady Statistic AP VP Percent: 1 %
Brady Statistic AP VS Percent: 1 %
Brady Statistic AS VP Percent: 98 %
Brady Statistic AS VS Percent: 1 %
Brady Statistic RA Percent Paced: 1 %
Brady Statistic RV Percent Paced: 99 %
Date Time Interrogation Session: 20220305040014
Implantable Lead Implant Date: 20211203
Implantable Lead Implant Date: 20211203
Implantable Lead Location: 753859
Implantable Lead Location: 753860
Implantable Pulse Generator Implant Date: 20211203
Lead Channel Impedance Value: 430 Ohm
Lead Channel Impedance Value: 680 Ohm
Lead Channel Pacing Threshold Amplitude: 0.5 V
Lead Channel Pacing Threshold Amplitude: 1.25 V
Lead Channel Pacing Threshold Pulse Width: 0.5 ms
Lead Channel Pacing Threshold Pulse Width: 0.5 ms
Lead Channel Sensing Intrinsic Amplitude: 0.9 mV
Lead Channel Sensing Intrinsic Amplitude: 6 mV
Lead Channel Setting Pacing Amplitude: 3.5 V
Lead Channel Setting Pacing Amplitude: 3.5 V
Lead Channel Setting Pacing Pulse Width: 0.5 ms
Lead Channel Setting Sensing Sensitivity: 2 mV
Pulse Gen Model: 2272
Pulse Gen Serial Number: 3879909

## 2020-09-14 ENCOUNTER — Encounter: Payer: Medicare Other | Admitting: Cardiology

## 2020-09-15 ENCOUNTER — Ambulatory Visit: Payer: Medicare Other | Admitting: Cardiology

## 2020-09-21 ENCOUNTER — Telehealth: Payer: Self-pay

## 2020-09-21 NOTE — Telephone Encounter (Signed)
error 

## 2020-09-21 NOTE — Progress Notes (Signed)
Remote pacemaker transmission.   

## 2020-10-01 ENCOUNTER — Encounter: Payer: Medicare Other | Admitting: Cardiology

## 2020-10-11 ENCOUNTER — Ambulatory Visit: Payer: Medicare Other | Admitting: Cardiology

## 2020-10-19 ENCOUNTER — Encounter: Payer: Medicare Other | Admitting: Cardiology

## 2020-10-19 DIAGNOSIS — E119 Type 2 diabetes mellitus without complications: Secondary | ICD-10-CM | POA: Diagnosis not present

## 2020-10-19 DIAGNOSIS — Z794 Long term (current) use of insulin: Secondary | ICD-10-CM | POA: Diagnosis not present

## 2020-10-19 DIAGNOSIS — E785 Hyperlipidemia, unspecified: Secondary | ICD-10-CM | POA: Diagnosis not present

## 2020-11-10 DIAGNOSIS — D509 Iron deficiency anemia, unspecified: Secondary | ICD-10-CM | POA: Diagnosis not present

## 2020-11-10 DIAGNOSIS — I10 Essential (primary) hypertension: Secondary | ICD-10-CM | POA: Diagnosis not present

## 2020-11-10 DIAGNOSIS — G8929 Other chronic pain: Secondary | ICD-10-CM | POA: Diagnosis not present

## 2020-11-10 DIAGNOSIS — K219 Gastro-esophageal reflux disease without esophagitis: Secondary | ICD-10-CM | POA: Diagnosis not present

## 2020-11-10 DIAGNOSIS — I442 Atrioventricular block, complete: Secondary | ICD-10-CM | POA: Diagnosis not present

## 2020-11-10 DIAGNOSIS — E559 Vitamin D deficiency, unspecified: Secondary | ICD-10-CM | POA: Diagnosis not present

## 2020-12-13 ENCOUNTER — Ambulatory Visit (INDEPENDENT_AMBULATORY_CARE_PROVIDER_SITE_OTHER): Payer: Medicare Other

## 2020-12-13 DIAGNOSIS — I442 Atrioventricular block, complete: Secondary | ICD-10-CM

## 2020-12-13 LAB — CUP PACEART REMOTE DEVICE CHECK
Battery Remaining Longevity: 64 mo
Battery Remaining Percentage: 95.5 %
Battery Voltage: 2.99 V
Brady Statistic AP VP Percent: 2.9 %
Brady Statistic AP VS Percent: 1 %
Brady Statistic AS VP Percent: 96 %
Brady Statistic AS VS Percent: 1 %
Brady Statistic RA Percent Paced: 2.9 %
Brady Statistic RV Percent Paced: 99 %
Date Time Interrogation Session: 20220606020014
Implantable Lead Implant Date: 20211203
Implantable Lead Implant Date: 20211203
Implantable Lead Location: 753859
Implantable Lead Location: 753860
Implantable Pulse Generator Implant Date: 20211203
Lead Channel Impedance Value: 390 Ohm
Lead Channel Impedance Value: 640 Ohm
Lead Channel Pacing Threshold Amplitude: 0.5 V
Lead Channel Pacing Threshold Amplitude: 1.25 V
Lead Channel Pacing Threshold Pulse Width: 0.5 ms
Lead Channel Pacing Threshold Pulse Width: 0.5 ms
Lead Channel Sensing Intrinsic Amplitude: 0.5 mV
Lead Channel Sensing Intrinsic Amplitude: 12 mV
Lead Channel Setting Pacing Amplitude: 3.5 V
Lead Channel Setting Pacing Amplitude: 3.5 V
Lead Channel Setting Pacing Pulse Width: 0.5 ms
Lead Channel Setting Sensing Sensitivity: 2 mV
Pulse Gen Model: 2272
Pulse Gen Serial Number: 3879909

## 2020-12-15 DIAGNOSIS — Z139 Encounter for screening, unspecified: Secondary | ICD-10-CM | POA: Diagnosis not present

## 2020-12-15 DIAGNOSIS — E785 Hyperlipidemia, unspecified: Secondary | ICD-10-CM | POA: Diagnosis not present

## 2020-12-15 DIAGNOSIS — Z0000 Encounter for general adult medical examination without abnormal findings: Secondary | ICD-10-CM | POA: Diagnosis not present

## 2020-12-15 DIAGNOSIS — Z9181 History of falling: Secondary | ICD-10-CM | POA: Diagnosis not present

## 2020-12-16 ENCOUNTER — Ambulatory Visit: Payer: Medicare Other | Admitting: Cardiology

## 2020-12-22 IMAGING — CR DG CHEST 2V
2 series · 2 of 2 positions shown · non-contrast
Comparison: 06/11/2020

CLINICAL DATA: Status post pacemaker placement

EXAM:
CHEST - 2 VIEW

[chest lat]
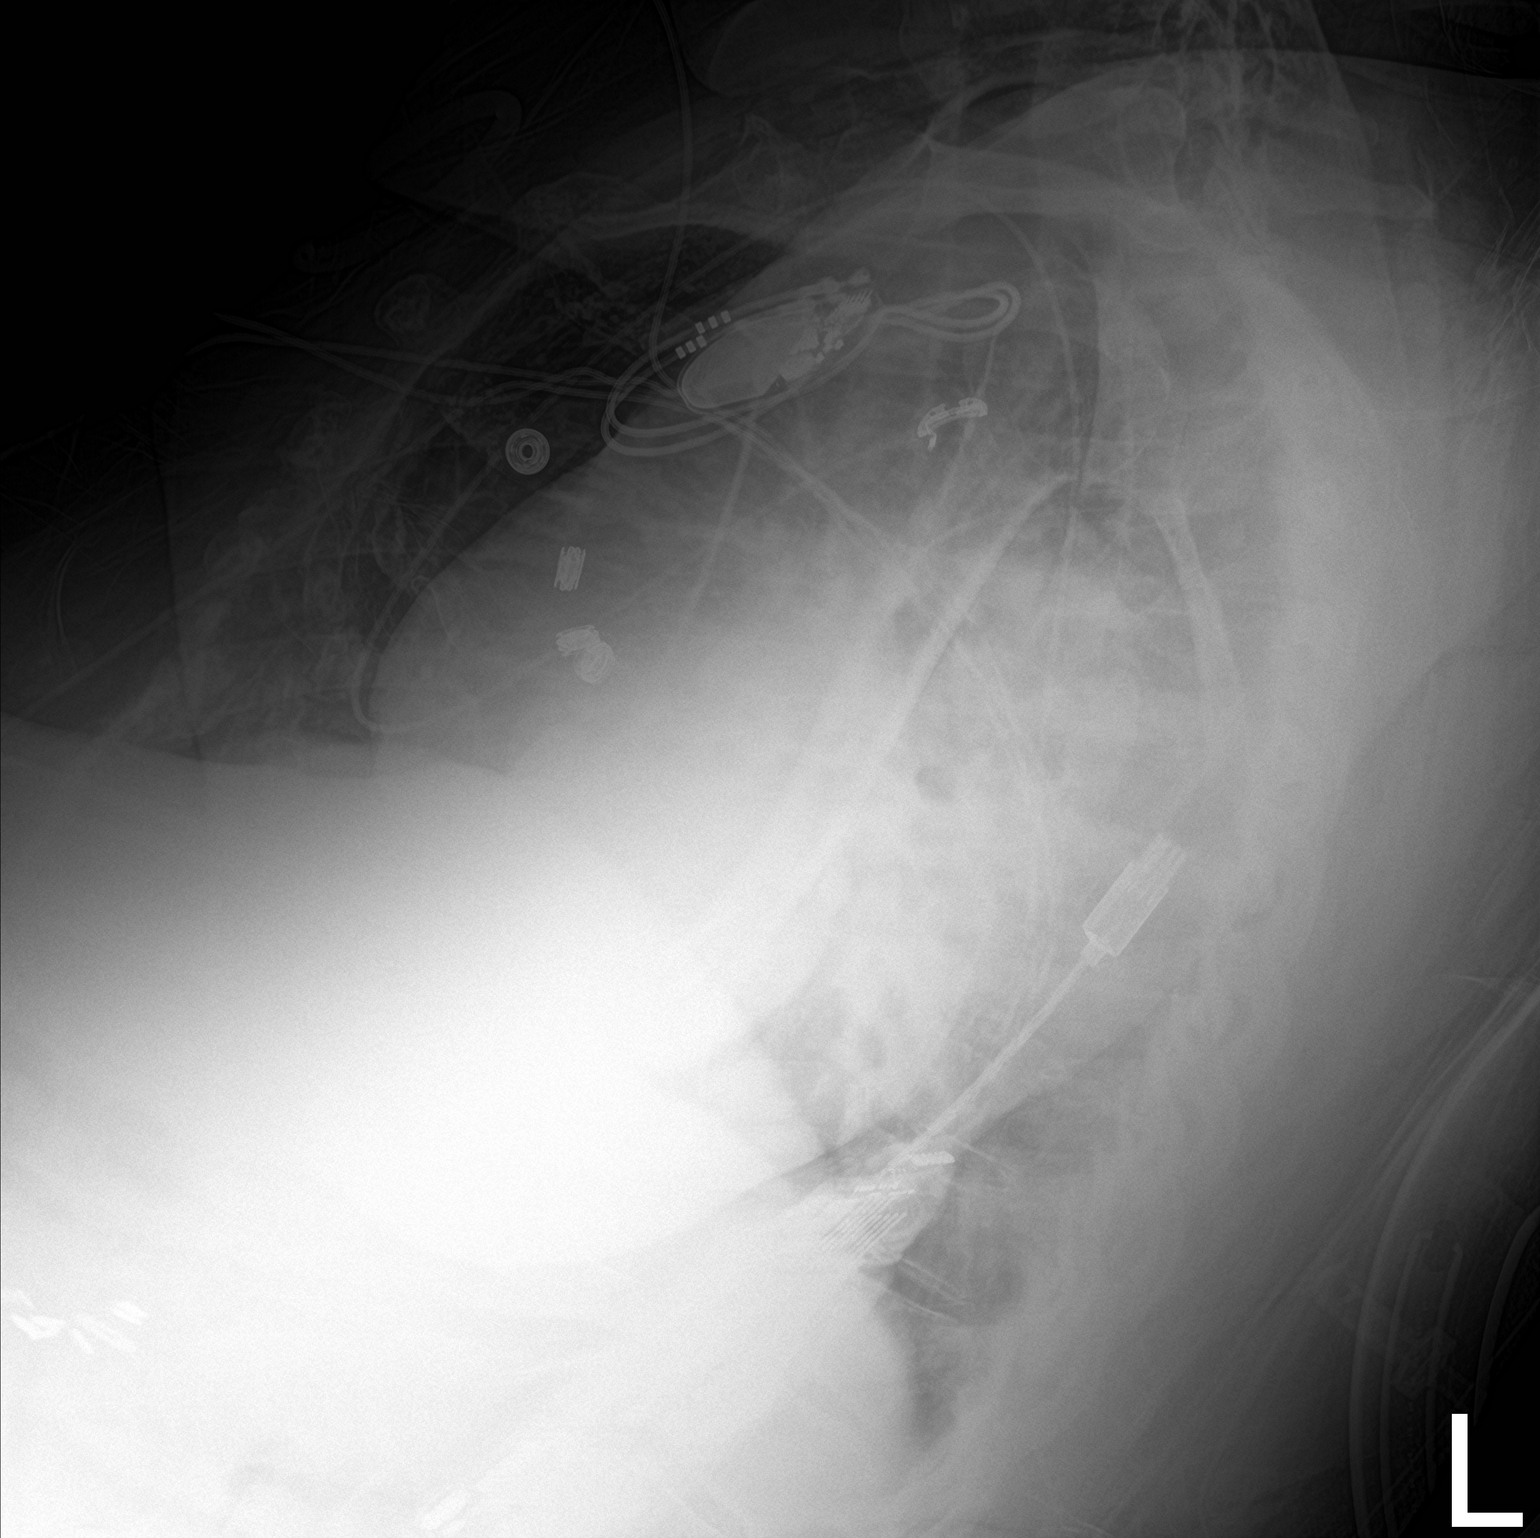

[chest ap]
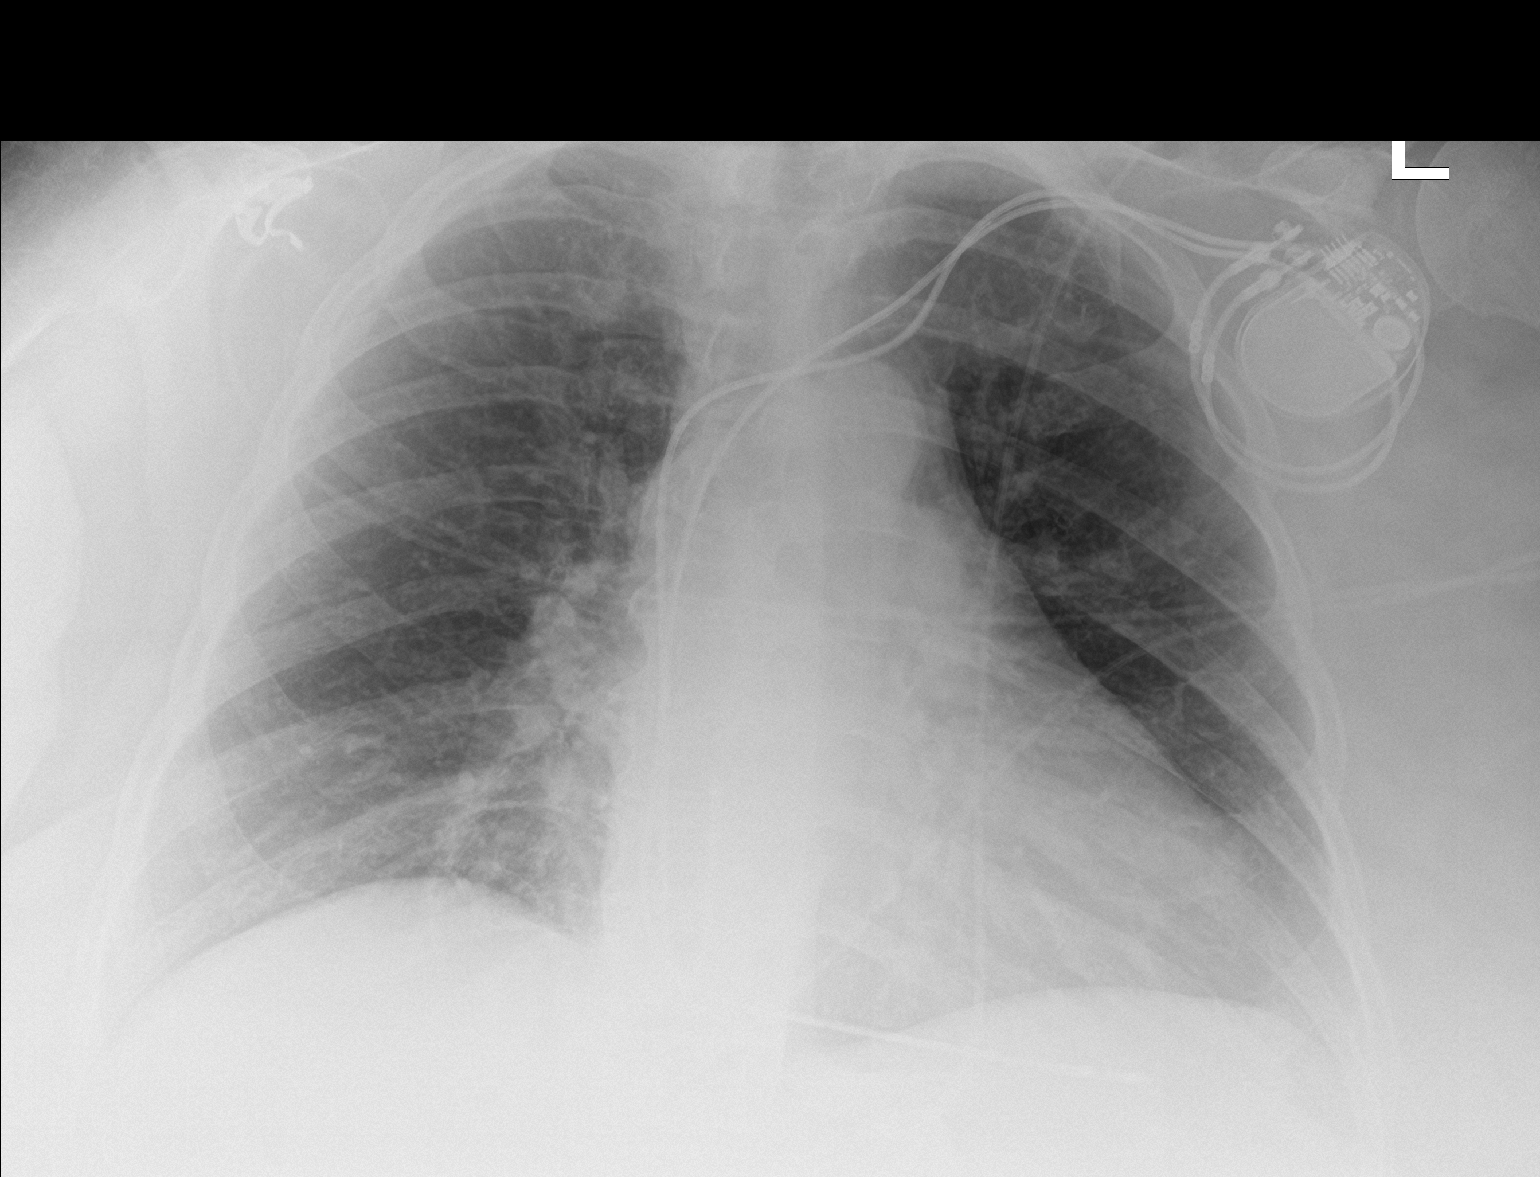

[2 of 2 positions shown; findings below may reference images not displayed]

FINDINGS: Cardiac shadow is enlarged. New pacing device is seen in
satisfactory position. No pneumothorax is noted. The lungs are
clear. No bony abnormality is noted.
IMPRESSION: No pneumothorax following pacemaker placement.

## 2020-12-22 IMAGING — CR DG CHEST 2V
3 series · 3 of 3 positions shown · non-contrast
Comparison: 06/12/2020

CLINICAL DATA: Pacemaker placement yesterday. Evaluate for lead
location.

EXAM:
CHEST - 2 VIEW

[chest lat]
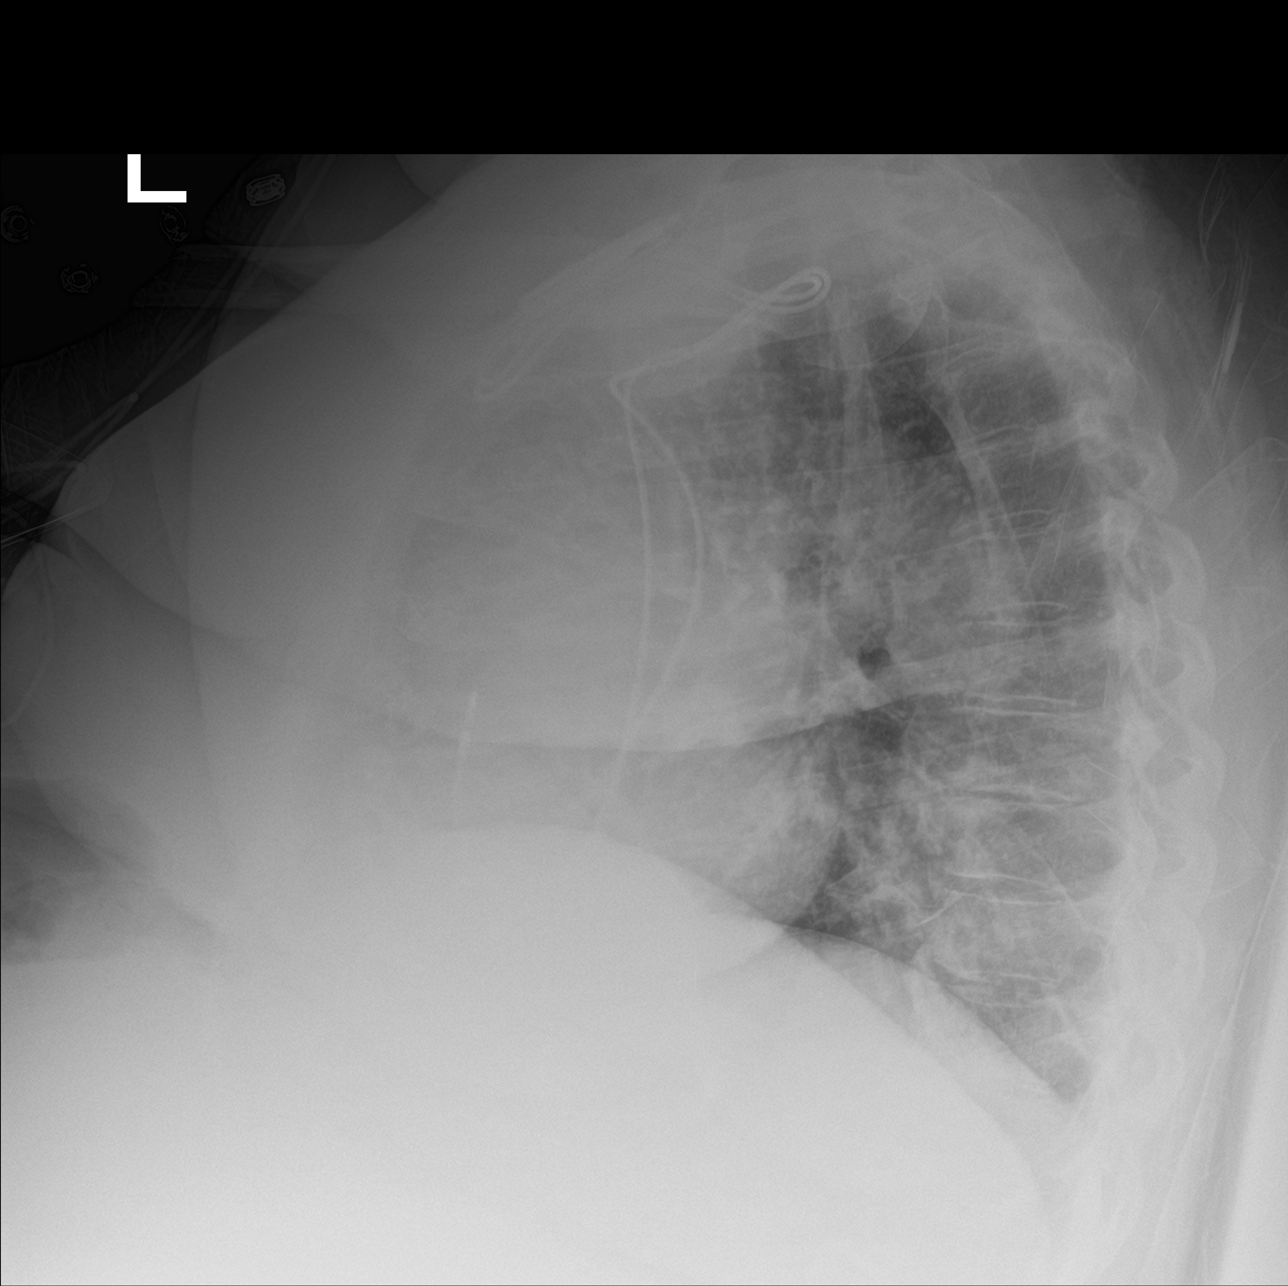

[chest ap (1 of 2)]
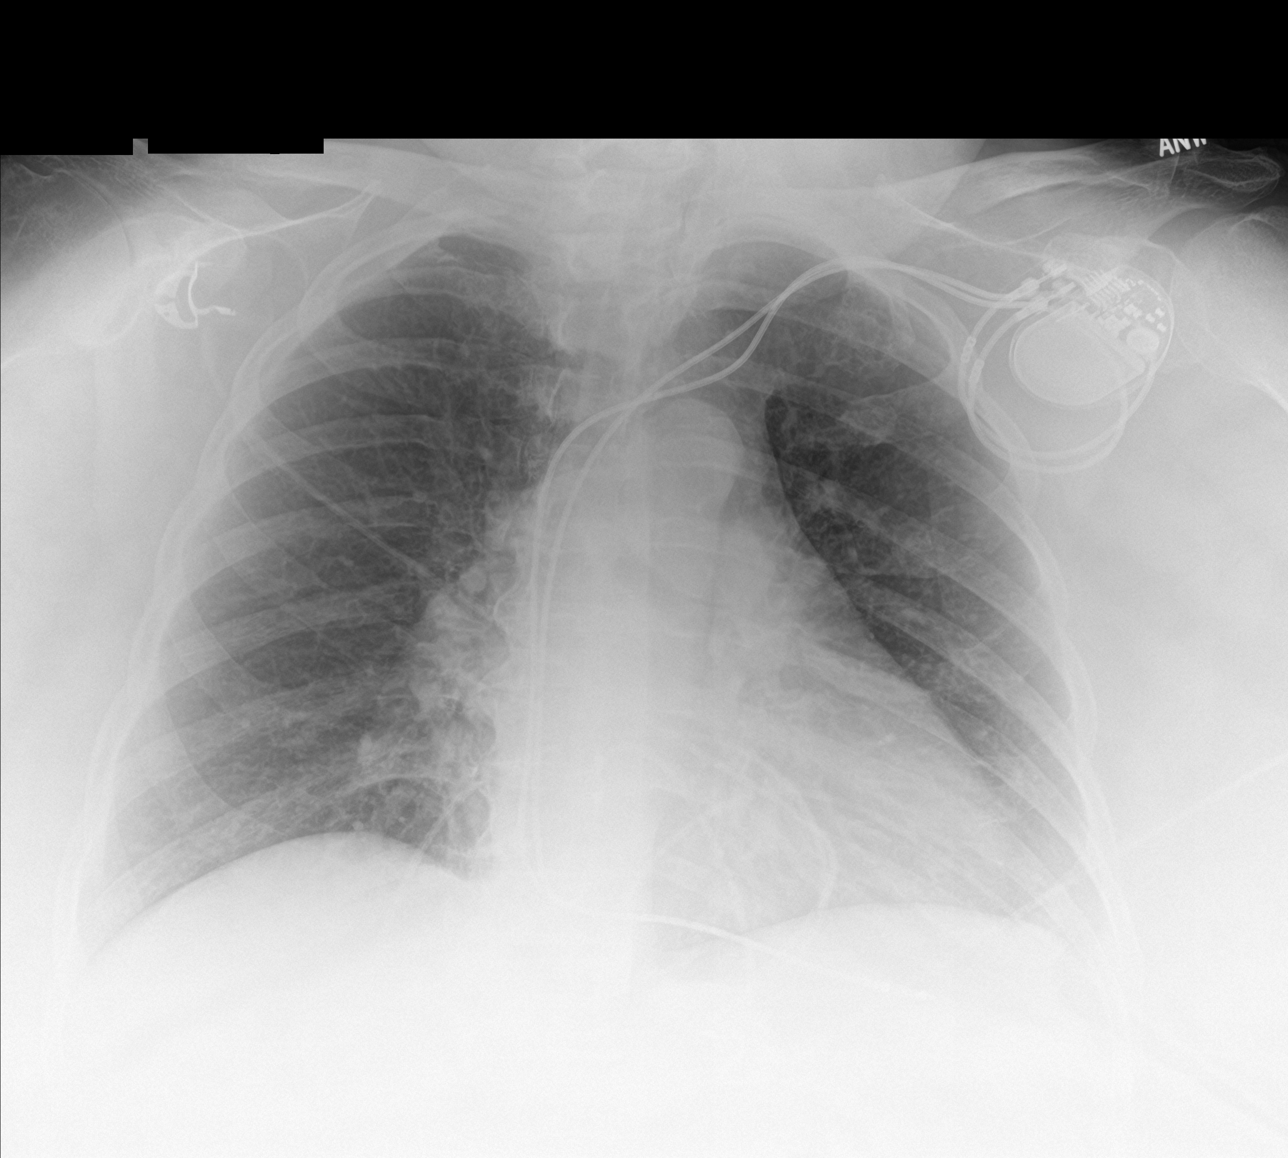

[chest ap (2 of 2)]
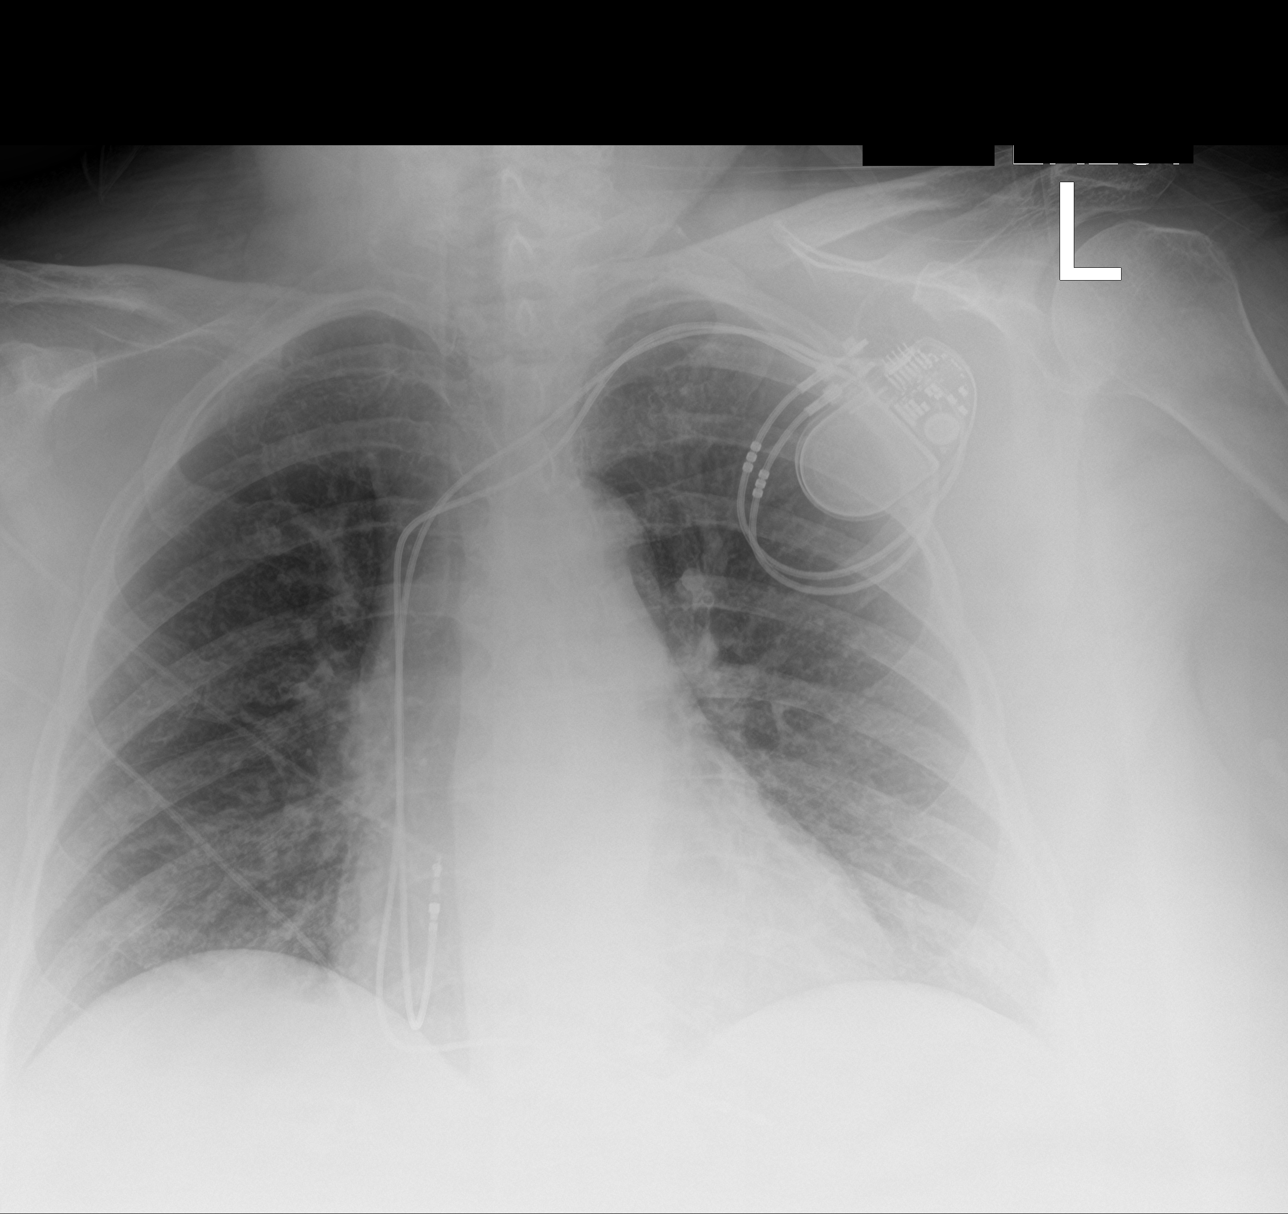

[3 of 3 positions shown; findings below may reference images not displayed]

FINDINGS: Repeat imaging performed to confirm lead placement which was not
clear on the study earlier today.

Left subclavian transvenous pacemaker is in place with leads in the
right atrium and right ventricle in good position. No pneumothorax.

Heart size and vascularity normal. Negative for heart failure. Lungs
are clear.
IMPRESSION: Repeat imaging confirms good pacemaker lead placement in the right
atrium and right ventricle. No complication.

## 2020-12-27 ENCOUNTER — Telehealth: Payer: Self-pay

## 2020-12-27 NOTE — Telephone Encounter (Signed)
Called and spoke with patient.  Patient to have her son call back to schedule appt as he will be the person bringing her to the appt.  [facility] phone provided to pt.

## 2020-12-27 NOTE — Telephone Encounter (Signed)
Patient called back and scheduled appt for 12/29/20 at 1:30 pm with AFib Clinic.

## 2020-12-27 NOTE — Telephone Encounter (Signed)
Patient reports over the past few days she has experienced palpitations, some increased shortness of breath and right sided chest pain. Denies chest pain at this time.   Manual transmission received and patient is still in AF w/ controlled rates.  Advised patient I will forward to Dr. Lalla Brothers as well as AF clinic for possible evaluation to discuss OAC. Patient agreeable to plan.

## 2020-12-27 NOTE — Telephone Encounter (Signed)
Alert for long AT/AF event. AF burden <1%; EGM suggest AF - ongoing; V rates controlled per histogram.   Attempted to contact patient to send manual transmission to assess presenting and assess patient. No OAC on file.   No answer, LMOVM.

## 2020-12-27 NOTE — Telephone Encounter (Signed)
Pt returning phone call... please advise  

## 2020-12-29 ENCOUNTER — Other Ambulatory Visit: Payer: Self-pay

## 2020-12-29 ENCOUNTER — Telehealth: Payer: Self-pay | Admitting: Cardiology

## 2020-12-29 ENCOUNTER — Ambulatory Visit (HOSPITAL_COMMUNITY)
Admission: RE | Admit: 2020-12-29 | Discharge: 2020-12-29 | Disposition: A | Discharge status: Home / Self Care | Payer: Medicare Other | Source: Ambulatory Visit | Attending: Nurse Practitioner | Admitting: Nurse Practitioner

## 2020-12-29 VITALS — BP 162/82 | HR 155 | Ht 62.99 in | Wt 300.0 lb

## 2020-12-29 DIAGNOSIS — E669 Obesity, unspecified: Secondary | ICD-10-CM | POA: Insufficient documentation

## 2020-12-29 DIAGNOSIS — I4891 Unspecified atrial fibrillation: Secondary | ICD-10-CM | POA: Diagnosis not present

## 2020-12-29 DIAGNOSIS — Z880 Allergy status to penicillin: Secondary | ICD-10-CM | POA: Insufficient documentation

## 2020-12-29 DIAGNOSIS — Z6841 Body Mass Index (BMI) 40.0 and over, adult: Secondary | ICD-10-CM | POA: Insufficient documentation

## 2020-12-29 DIAGNOSIS — Z8249 Family history of ischemic heart disease and other diseases of the circulatory system: Secondary | ICD-10-CM | POA: Diagnosis not present

## 2020-12-29 DIAGNOSIS — I4892 Unspecified atrial flutter: Secondary | ICD-10-CM | POA: Diagnosis not present

## 2020-12-29 DIAGNOSIS — Z7901 Long term (current) use of anticoagulants: Secondary | ICD-10-CM | POA: Insufficient documentation

## 2020-12-29 MED ORDER — APIXABAN 5 MG PO TABS: 5.0000 mg | ORAL_TABLET | Freq: Two times a day (BID) | ORAL | 6 refills | Status: DC

## 2020-12-29 NOTE — Patient Instructions (Signed)
Start Eliquis 5mg  daily take one dose tonight

## 2020-12-29 NOTE — Progress Notes (Signed)
Primary Care Physician: Jim Like, NP Referring Physician: Dr. Delila Pereyra is a 75 y.o. female with a h/o morbid obesity, HTN, PPM, DM, that was referred by the device clinic for new onset afib since 12/27/20. Raynelle Fanning today shows atrial  flutter with v rates controlled. She has been aware of a flutter in her chest for a while. She is basically w/c bound 2/2 to obesity and trouble ambulating. She had gastric sleeve in the past and lost down to 250 now at 300 lbs, heaviest weight was 350 lbs. She has not on anticoagulation. She states a bleeding ulcer a couple of years past but it was felt  to  be 2/2 Mobic. Pt is no longer taking and has not has any further bleeding. CHA2DS2VASc  score is 5.   Today, she denies symptoms of palpitations, chest pain, shortness of breath, orthopnea, PND, lower extremity edema, dizziness, presyncope, syncope, or neurologic sequela. The patient is tolerating medications without difficulties and is otherwise without complaint today.   Past Medical History:  Diagnosis Date   Arthritis    Back pain    Bruises easily    Contact lens/glasses fitting    Cough    Diabetes mellitus    Fatigue    Hyperlipidemia    Hypertension    Poor circulation    Sinus problem    Sleep apnea    SOB (shortness of breath)    Past Surgical History:  Procedure Laterality Date   CATARACT EXTRACTION     confirm date with patient   CHOLECYSTECTOMY  1987   EYE SURGERY  2003   retina   HERNIA REPAIR     LAPAROSCOPIC GASTRIC BANDING  03/22/10   PACEMAKER IMPLANT N/A 06/11/2020   Procedure: PACEMAKER IMPLANT;  Surgeon: Lanier Prude, MD;  Location: [facility];  Service: Cardiovascular;  Laterality: N/A;    Current Outpatient Medications  Medication Sig Dispense Refill   acetaminophen (TYLENOL) 325 MG tablet Take 650 mg by mouth in the morning and at bedtime.      albuterol (VENTOLIN HFA) 108 (90 Base) MCG/ACT inhaler Inhale 2 puffs into the lungs every  6 (six) hours as needed for wheezing or shortness of breath.      apixaban (ELIQUIS) 5 MG TABS tablet Take 1 tablet (5 mg total) by mouth 2 (two) times daily. 60 tablet 6   Ascorbic Acid (VITA-C PO) Take 1 tablet by mouth daily. Vitamin C     diclofenac sodium (VOLTAREN) 1 % GEL Apply 4 g topically every 6 (six) hours as needed (pain).      furosemide (LASIX) 40 MG tablet Take 40 mg by mouth daily as needed for fluid or edema.     insulin NPH-regular Human (70-30) 100 UNIT/ML injection Inject 55-90 Units into the skin 2 (two) times daily with a meal. Per sliding scale : not provided     Iron, Ferrous Sulfate, 325 (65 Fe) MG TABS Take 325 mg by mouth daily.      lisinopril-hydrochlorothiazide (ZESTORETIC) 10-12.5 MG tablet Take 1 tablet by mouth daily.     MELATONIN ER PO Take 10 mg by mouth at bedtime.     omeprazole (PRILOSEC) 40 MG capsule Take 1 capsule by mouth daily.     ondansetron (ZOFRAN-ODT) 4 MG disintegrating tablet Take 4 mg by mouth every 8 (eight) hours as needed for nausea or vomiting.      Probiotic Product (PROBIOTIC DAILY PO) Take  1 capsule by mouth daily in the afternoon.     rOPINIRole (REQUIP) 4 MG tablet Take 4 mg by mouth at bedtime.      traMADol (ULTRAM) 50 MG tablet Take 50 mg by mouth every 6 (six) hours.     Vitamin D, Ergocalciferol, (DRISDOL) 1.25 MG (50000 UNIT) CAPS capsule Take 50,000 Units by mouth once a week. Sunday     pantoprazole (PROTONIX) 40 MG tablet Take 40 mg by mouth daily. (Patient not taking: Reported on 12/29/2020)     No current facility-administered medications for this encounter.    Allergies  Allergen Reactions   Penicillins Hives    At injection site only.   Sulfa Antibiotics Hives   Erythromycin Other (See Comments)   Ciprofloxacin Hcl     Other reaction(s): Abdominal Pain    Social History   Socioeconomic History   Marital status: Married    Spouse name: Not on file   Number of children: Not on file   Years of education: Not on  file   Highest education level: Not on file  Occupational History   Not on file  Tobacco Use   Smoking status: Never   Smokeless tobacco: Never  Vaping Use   Vaping Use: Never used  Substance and Sexual Activity   Alcohol use: No   Drug use: No   Sexual activity: Not on file  Other Topics Concern   Not on file  Social History Narrative   Not on file   Social Determinants of Health   Financial Resource Strain: Not on file  Food Insecurity: Not on file  Transportation Needs: Not on file  Physical Activity: Not on file  Stress: Not on file  Social Connections: Not on file  Intimate Partner Violence: Not on file    Family History  Problem Relation Age of Onset   Arthritis Mother    Heart disease Mother    Hypertension Mother    Heart disease Father    Diabetes Father    Diabetes Sister     ROS- All systems are reviewed and negative except as per the HPI above  Physical Exam: Vitals:   12/29/20 1314  BP: (!) 162/82  Pulse: (!) 155  Weight: 136.1 kg  Height: 5' 2.99" (1.6 m)   Wt Readings from Last 3 Encounters:  12/29/20 136.1 kg  06/12/20 (!) 143.5 kg  06/11/20 (!) 143.9 kg    Labs: Lab Results  Component Value Date   NA 137 06/12/2020   K 5.3 (H) 06/12/2020   CL 101 06/12/2020   CO2 28 06/12/2020   GLUCOSE 328 (H) 06/12/2020   BUN 32 (H) 06/12/2020   CREATININE 1.30 (H) 06/12/2020   CALCIUM 8.9 06/12/2020   No results found for: INR No results found for: CHOL, HDL, LDLCALC, TRIG   GEN- The patient is well appearing, alert and oriented x 3 today.   Head- normocephalic, atraumatic Eyes-  Sclera clear, conjunctiva pink Ears- hearing intact Oropharynx- clear Neck- supple, no JVP Lymph- no cervical lymphadenopathy Lungs- Clear to ausculation bilaterally, normal work of breathing Heart- Regular rate and rhythm, no murmurs, rubs or gallops, PMI not laterally displaced GI- soft, NT, ND, + BS Extremities- no clubbing, cyanosis, or edema MS- no  significant deformity or atrophy Skin- no rash or lesion Psych- euthymic mood, full affect Neuro- strength and sensation are intact  EKG- atrial flutter with v rates controlled  Echo-2021-1. Technically difficult echo with poor image quality. 2. Left ventricular ejection fraction,  by estimation, is 55 to 60%. The left ventricle has normal function. The left ventricle has no regional wall motion abnormalities. Left ventricular diastolic parameters are indeterminate. 3. Right ventricular systolic function is normal. The right ventricular size is normal. 4. The mitral valve is normal in structure. No evidence of mitral valve regurgitation. 5. The aortic valve is normal in structure. Aortic valve regurgitation is not visualized.   Assessment and Plan:  1. New onset  afib/flutter  General education re afib Triggers discussed  V rates controlled  Will not start rate control at this time   2. CHA2DS2VASc  score of 5 Will start eliquis 5 mg bid  Bleeding precautions discussed  Pt did have a GI bleed in the past thought to be 2/2 mobic use She will check with her GI MD in [place] to make sure she can use DOAC safely She has not had any recurrent bleeding since then  CBC 12/21 normal   3. BMI 53.16 Weight loss encouraged  Pt basically is sedentary, w/c bound  No alcohol or tobacco use   I will see back in 2 weeks    Lupita Leash C. [facility] [address] [place], [place] [address] [phone]

## 2020-12-29 NOTE — Telephone Encounter (Signed)
New message:      Patient calling stating that she need to speak with a nurse concering her having her heart shocked. Patient states that Dr Tomie China told her she can not be put to sleep. Patient went to the AFIB clinic this morning

## 2020-12-29 NOTE — Telephone Encounter (Signed)
Left vm to call back

## 2020-12-30 NOTE — Telephone Encounter (Signed)
FYI Pt states that she was saw in the afib clinic yesterday and was concerned that they might put her to sleep for her cardioversion. Pt states that you told her in the past it was not safe for her to be put to sleep.

## 2020-12-30 NOTE — Telephone Encounter (Signed)
Recommendations reviewed with pt as per Dr. Revankar's note.  Pt verbalized understanding and had no additional questions.   

## 2021-01-03 NOTE — Progress Notes (Signed)
Remote pacemaker transmission.   

## 2021-01-06 ENCOUNTER — Telehealth (HOSPITAL_COMMUNITY): Payer: Self-pay | Admitting: *Deleted

## 2021-01-06 NOTE — Telephone Encounter (Signed)
Patient called in stating she noted vaginal bleeding this morning (recently started eliquis). Instructed pt to contact her PCP for further assessment asap. Pt will update findings.

## 2021-01-07 NOTE — Telephone Encounter (Signed)
Pt called back this morning stating she has noticed no more vaginal bleeding therefore her PCP recommended watchful waiting instead of bringing in for assessment. Reinforced with pt should bleeding return she should be assessed.

## 2021-01-12 ENCOUNTER — Other Ambulatory Visit: Payer: Self-pay

## 2021-01-12 ENCOUNTER — Ambulatory Visit (HOSPITAL_COMMUNITY)
Admission: RE | Admit: 2021-01-12 | Discharge: 2021-01-12 | Disposition: A | Discharge status: Home / Self Care | Payer: Medicare Other | Source: Ambulatory Visit | Attending: Nurse Practitioner | Admitting: Nurse Practitioner

## 2021-01-12 ENCOUNTER — Encounter (HOSPITAL_COMMUNITY): Payer: Self-pay | Admitting: Nurse Practitioner

## 2021-01-12 VITALS — BP 146/74 | HR 70 | Ht 62.99 in | Wt 302.0 lb

## 2021-01-12 DIAGNOSIS — I10 Essential (primary) hypertension: Secondary | ICD-10-CM | POA: Diagnosis not present

## 2021-01-12 DIAGNOSIS — I4891 Unspecified atrial fibrillation: Secondary | ICD-10-CM | POA: Diagnosis not present

## 2021-01-12 DIAGNOSIS — Z7901 Long term (current) use of anticoagulants: Secondary | ICD-10-CM | POA: Insufficient documentation

## 2021-01-12 DIAGNOSIS — Z794 Long term (current) use of insulin: Secondary | ICD-10-CM | POA: Insufficient documentation

## 2021-01-12 DIAGNOSIS — E119 Type 2 diabetes mellitus without complications: Secondary | ICD-10-CM | POA: Diagnosis not present

## 2021-01-12 DIAGNOSIS — I4892 Unspecified atrial flutter: Secondary | ICD-10-CM | POA: Insufficient documentation

## 2021-01-12 DIAGNOSIS — D6869 Other thrombophilia: Secondary | ICD-10-CM | POA: Diagnosis not present

## 2021-01-12 DIAGNOSIS — Z6841 Body Mass Index (BMI) 40.0 and over, adult: Secondary | ICD-10-CM | POA: Insufficient documentation

## 2021-01-12 NOTE — Progress Notes (Signed)
Primary Care Physician: Jim Like, NP Referring Physician: Dr. Delila Pereyra is a 75 y.o. female with a h/o morbid obesity, HTN, PPM, DM, that was referred by the device clinic for new onset afib since 12/27/20. Holly Spence today shows atrial  flutter with v rates controlled. She has been aware of a flutter in her chest for a while. She is basically w/c bound 2/2 to obesity and trouble ambulating. She had gastric sleeve in the past and lost down to 250 now at 300 lbs, heaviest weight was 350 lbs. She has not on anticoagulation. She states a bleeding ulcer a couple of years past but it was felt  to  be 2/2 Mobic. Pt is no longer taking and has not has any further bleeding. CHA2DS2VASc  score is 5.   F/u in afib clinic, 01/12/21. She remains in afib, rate controlled. She has missed several of her Eliquis tabs but her daughter has set an alarm on pt's phone to alert her. We discussed a cardioversion again but she states that she does not feel the  afib but at this point she does not feel driven to undertake this.    Today, she denies symptoms of palpitations, chest pain, shortness of breath, orthopnea, PND, lower extremity edema, dizziness, presyncope, syncope, or neurologic sequela. The patient is tolerating medications without difficulties and is otherwise without complaint today.   Past Medical History:  Diagnosis Date   Arthritis    Back pain    Bruises easily    Contact lens/glasses fitting    Cough    Diabetes mellitus    Fatigue    Hyperlipidemia    Hypertension    Poor circulation    Sinus problem    Sleep apnea    SOB (shortness of breath)    Past Surgical History:  Procedure Laterality Date   CATARACT EXTRACTION     confirm date with patient   CHOLECYSTECTOMY  1987   EYE SURGERY  2003   retina   HERNIA REPAIR     LAPAROSCOPIC GASTRIC BANDING  03/22/10   PACEMAKER IMPLANT N/A 06/11/2020   Procedure: PACEMAKER IMPLANT;  Surgeon: Lanier Prude, MD;  Location: [facility]  IN[facility];  Service: Cardiovascular;  Laterality: N/A;    Current Outpatient Medications  Medication Sig Dispense Refill   acetaminophen (TYLENOL) 325 MG tablet Take 650 mg by mouth in the morning and at bedtime.      albuterol (VENTOLIN HFA) 108 (90 Base) MCG/ACT inhaler Inhale 2 puffs into the lungs every 6 (six) hours as needed for wheezing or shortness of breath.      apixaban (ELIQUIS) 5 MG TABS tablet Take 1 tablet (5 mg total) by mouth 2 (two) times daily. 60 tablet 6   Ascorbic Acid (VITA-C PO) Take 1 tablet by mouth daily. Vitamin C     diclofenac sodium (VOLTAREN) 1 % GEL Apply 4 g topically every 6 (six) hours as needed (pain).      furosemide (LASIX) 40 MG tablet Take 40 mg by mouth daily as needed for fluid or edema.     insulin NPH-regular Human (70-30) 100 UNIT/ML injection Inject 55-90 Units into the skin 2 (two) times daily with a meal. Per sliding scale : not provided     Iron, Ferrous Sulfate, 325 (65 Fe) MG TABS Take 325 mg by mouth daily.      lisinopril-hydrochlorothiazide (ZESTORETIC) 10-12.5 MG tablet Take 1 tablet by mouth daily.  MELATONIN ER PO Take 10 mg by mouth at bedtime.     omeprazole (PRILOSEC) 40 MG capsule Take 1 capsule by mouth daily.     ondansetron (ZOFRAN-ODT) 4 MG disintegrating tablet Take 4 mg by mouth every 8 (eight) hours as needed for nausea or vomiting.      pantoprazole (PROTONIX) 40 MG tablet Take 40 mg by mouth daily.     Probiotic Product (PROBIOTIC DAILY PO) Take 1 capsule by mouth daily in the afternoon.     rOPINIRole (REQUIP) 4 MG tablet Take 4 mg by mouth at bedtime.      traMADol (ULTRAM) 50 MG tablet Take 50 mg by mouth every 6 (six) hours.     Vitamin D, Ergocalciferol, (DRISDOL) 1.25 MG (50000 UNIT) CAPS capsule Take 50,000 Units by mouth once a week. Sunday     No current facility-administered medications for this encounter.    Allergies  Allergen Reactions   Penicillins Hives    At injection site only.   Sulfa  Antibiotics Hives   Erythromycin Other (See Comments)   Ciprofloxacin Hcl     Other reaction(s): Abdominal Pain    Social History   Socioeconomic History   Marital status: Married    Spouse name: Not on file   Number of children: Not on file   Years of education: Not on file   Highest education level: Not on file  Occupational History   Not on file  Tobacco Use   Smoking status: Never   Smokeless tobacco: Never  Vaping Use   Vaping Use: Never used  Substance and Sexual Activity   Alcohol use: No   Drug use: No   Sexual activity: Not on file  Other Topics Concern   Not on file  Social History Narrative   Not on file   Social Determinants of Health   Financial Resource Strain: Not on file  Food Insecurity: Not on file  Transportation Needs: Not on file  Physical Activity: Not on file  Stress: Not on file  Social Connections: Not on file  Intimate Partner Violence: Not on file    Family History  Problem Relation Age of Onset   Arthritis Mother    Heart disease Mother    Hypertension Mother    Heart disease Father    Diabetes Father    Diabetes Sister     ROS- All systems are reviewed and negative except as per the HPI above  Physical Exam: Vitals:   01/12/21 1442  BP: (!) 146/74  Pulse: 70  Weight: (!) 137 kg  Height: 5' 2.99" (1.6 m)   Wt Readings from Last 3 Encounters:  01/12/21 (!) 137 kg  12/29/20 136.1 kg  06/12/20 (!) 143.5 kg    Labs: Lab Results  Component Value Date   NA 137 06/12/2020   K 5.3 (H) 06/12/2020   CL 101 06/12/2020   CO2 28 06/12/2020   GLUCOSE 328 (H) 06/12/2020   BUN 32 (H) 06/12/2020   CREATININE 1.30 (H) 06/12/2020   CALCIUM 8.9 06/12/2020   No results found for: INR No results found for: CHOL, HDL, LDLCALC, TRIG   GEN- The patient is well appearing, alert and oriented x 3 today.   Head- normocephalic, atraumatic Eyes-  Sclera clear, conjunctiva pink Ears- hearing intact Oropharynx- clear Neck- supple, no  JVP Lymph- no cervical lymphadenopathy Lungs- Clear to ausculation bilaterally, normal work of breathing Heart- irregular rate and rhythm, no murmurs, rubs or gallops, PMI not laterally displaced GI-  soft, NT, ND, + BS Extremities- no clubbing, cyanosis, or edema MS- no significant deformity or atrophy Skin- no rash or lesion Psych- euthymic mood, full affect Neuro- strength and sensation are intact  EKG-  v paced at 70 bpm, qrs int 172 ms, qtc 492 ms   Echo-2021-1. Technically difficult echo with poor image quality. 2. Left ventricular ejection fraction, by estimation, is 55 to 60%. The left ventricle has normal function. The left ventricle has no regional wall motion abnormalities. Left ventricular diastolic parameters are indeterminate. 3. Right ventricular systolic function is normal. The right ventricular size is normal. 4. The mitral valve is normal in structure. No evidence of mitral valve regurgitation. 5. The aortic valve is normal in structure. Aortic valve regurgitation is not visualized.  Assessment and Plan:   1. New onset  afib/flutter  V rates controlled  Will not start rate control at this time  Pt states that afib is not making her symptomatic and at this point probably will not pursue a CV   2. CHA2DS2VASc  score of 5 Continue  eliquis 5 mg bid, has missed several doses, an alarm has been set on her phone by her daughter so not to miss  Bleeding precautions discussed  Pt did have a GI bleed in the past thought to be 2/2 mobic use She has not had any recurrent bleeding since then  CBC on return visit   3. BMI 53.16 Weight loss encouraged  Pt basically is sedentary, w/c bound  No alcohol or tobacco use   I will see back in 2 weeks    Lupita Leash C. [facility] [address] [place], [place] [address] [phone]

## 2021-01-19 DIAGNOSIS — E119 Type 2 diabetes mellitus without complications: Secondary | ICD-10-CM | POA: Diagnosis not present

## 2021-01-19 DIAGNOSIS — Z794 Long term (current) use of insulin: Secondary | ICD-10-CM | POA: Diagnosis not present

## 2021-01-19 DIAGNOSIS — E785 Hyperlipidemia, unspecified: Secondary | ICD-10-CM | POA: Diagnosis not present

## 2021-01-26 ENCOUNTER — Telehealth: Payer: Self-pay

## 2021-01-26 NOTE — Telephone Encounter (Signed)
"  [facility] (weekly) AF alert since > burden. Presenting rhythm AF with Vpaced. OAC- Eliquis. Recent AF clinic visit declared patient "rate control strategy" with known AF at that visit. Routing for considering disabling reoccurring AF alert on [employer]".  Dr. Lalla Brothers this is a recommendation per [facility]. Do you agree with disabling AF alerts?

## 2021-01-27 ENCOUNTER — Other Ambulatory Visit: Payer: Self-pay

## 2021-01-27 ENCOUNTER — Encounter (HOSPITAL_COMMUNITY): Payer: Self-pay | Admitting: Nurse Practitioner

## 2021-01-27 ENCOUNTER — Ambulatory Visit (HOSPITAL_COMMUNITY)
Admission: RE | Admit: 2021-01-27 | Discharge: 2021-01-27 | Disposition: A | Discharge status: Home / Self Care | Payer: Medicare Other | Source: Ambulatory Visit | Attending: Nurse Practitioner | Admitting: Nurse Practitioner

## 2021-01-27 VITALS — BP 124/60 | HR 70 | Ht 62.99 in | Wt 306.4 lb

## 2021-01-27 DIAGNOSIS — Z87891 Personal history of nicotine dependence: Secondary | ICD-10-CM | POA: Insufficient documentation

## 2021-01-27 DIAGNOSIS — I10 Essential (primary) hypertension: Secondary | ICD-10-CM | POA: Diagnosis not present

## 2021-01-27 DIAGNOSIS — Z6841 Body Mass Index (BMI) 40.0 and over, adult: Secondary | ICD-10-CM | POA: Diagnosis not present

## 2021-01-27 DIAGNOSIS — Z79899 Other long term (current) drug therapy: Secondary | ICD-10-CM | POA: Insufficient documentation

## 2021-01-27 DIAGNOSIS — Z7901 Long term (current) use of anticoagulants: Secondary | ICD-10-CM | POA: Diagnosis not present

## 2021-01-27 DIAGNOSIS — Z950 Presence of cardiac pacemaker: Secondary | ICD-10-CM | POA: Diagnosis not present

## 2021-01-27 DIAGNOSIS — Z882 Allergy status to sulfonamides status: Secondary | ICD-10-CM | POA: Diagnosis not present

## 2021-01-27 DIAGNOSIS — I4891 Unspecified atrial fibrillation: Secondary | ICD-10-CM | POA: Insufficient documentation

## 2021-01-27 DIAGNOSIS — Z794 Long term (current) use of insulin: Secondary | ICD-10-CM | POA: Insufficient documentation

## 2021-01-27 DIAGNOSIS — Z881 Allergy status to other antibiotic agents status: Secondary | ICD-10-CM | POA: Insufficient documentation

## 2021-01-27 DIAGNOSIS — E119 Type 2 diabetes mellitus without complications: Secondary | ICD-10-CM | POA: Insufficient documentation

## 2021-01-27 DIAGNOSIS — Z880 Allergy status to penicillin: Secondary | ICD-10-CM | POA: Diagnosis not present

## 2021-01-27 DIAGNOSIS — Z9884 Bariatric surgery status: Secondary | ICD-10-CM | POA: Diagnosis not present

## 2021-01-27 DIAGNOSIS — Z8249 Family history of ischemic heart disease and other diseases of the circulatory system: Secondary | ICD-10-CM | POA: Diagnosis not present

## 2021-01-27 DIAGNOSIS — D6869 Other thrombophilia: Secondary | ICD-10-CM | POA: Diagnosis not present

## 2021-01-27 LAB — CBC
HCT: 43.3 % (ref 36.0–46.0)
Hemoglobin: 13 g/dL (ref 12.0–15.0)
MCH: 29.2 pg (ref 26.0–34.0)
MCHC: 30 g/dL (ref 30.0–36.0)
MCV: 97.3 fL (ref 80.0–100.0)
Platelets: 189 10*3/uL (ref 150–400)
RBC: 4.45 MIL/uL (ref 3.87–5.11)
RDW: 13 % (ref 11.5–15.5)
WBC: 8.7 10*3/uL (ref 4.0–10.5)
nRBC: 0 % (ref 0.0–0.2)

## 2021-01-27 LAB — BASIC METABOLIC PANEL
Anion gap: 8 (ref 5–15)
BUN: 14 mg/dL (ref 8–23)
CO2: 28 mmol/L (ref 22–32)
Calcium: 8.5 mg/dL — ABNORMAL LOW (ref 8.9–10.3)
Chloride: 102 mmol/L (ref 98–111)
Creatinine, Ser: 1.11 mg/dL — ABNORMAL HIGH (ref 0.44–1.00)
GFR, Estimated: 51 mL/min — ABNORMAL LOW (ref >60)
Glucose, Bld: 344 mg/dL — ABNORMAL HIGH (ref 70–99)
Potassium: 4.1 mmol/L (ref 3.5–5.1)
Sodium: 138 mmol/L (ref 135–145)

## 2021-01-27 NOTE — Patient Instructions (Signed)
Cardioversion scheduled for Monday, August 1st  - Arrive at the [facility] and go to admitting at [facility] not eat or drink anything after midnight the night prior to your procedure.  - Take all your morning medication (except diabetic medications) with a sip of water prior to arrival.  - You will not be able to drive home after your procedure.  - Do NOT miss any doses of your blood thinner - if you should miss a dose please notify our office immediately.  - If you feel as if you go back into normal rhythm prior to scheduled cardioversion, please notify our office immediately. If your procedure is canceled in the cardioversion suite you will be charged a cancellation fee.  Patients will be asked to: to mask in public and hand hygiene (no longer quarantine) in the 3 days prior to surgery, to report if any COVID-19-like illness or household contacts to COVID-19 to determine need for testing

## 2021-01-27 NOTE — Progress Notes (Signed)
Primary Care Physician: Jim Like, NP Referring Physician: Dr. Delila Pereyra is a 75 y.o. female with a h/o morbid obesity, HTN, PPM, DM, that was referred by the device clinic for new onset afib since 12/27/20. Raynelle Fanning today shows atrial  flutter with v rates controlled. She has been aware of a flutter in her chest for a while. She is basically w/c bound 2/2 to obesity and trouble ambulating. She had gastric sleeve in the past and lost down to 250 now at 300 lbs, heaviest weight was 350 lbs. She has not on anticoagulation. She states a bleeding ulcer a couple of years past but it was felt  to  be 2/2 Mobic. Pt is no longer taking and has not has any further bleeding. CHA2DS2VASc  score is 5.   F/u in afib clinic, 01/12/21. She remains in afib, rate controlled. She has missed several of her Eliquis tabs but her daughter has set an alarm on pt's phone to alert her. We discussed a cardioversion again but she states that she does not feel the  afib but at this point she does not feel driven to undertake this.    F/u in afib clinic, 01/27/21. Pt returns still in afib, v paced and rate controlled.  She describes that she had some PND, orthopnea. Weight is up 5 lbs today. She does take lasix prn, she has not taken this week. On last visit, she voiced concern that she did not want to undergo cardioversion but with her latest symptoms, she will proceed with same. She has not missed any anticoagulation since before 7/6. She has an alarm set on her phone now to remind her.    Today, she denies symptoms of palpitations, chest pain, shortness of breath, orthopnea, PND, lower extremity edema, dizziness, presyncope, syncope, or neurologic sequela. The patient is tolerating medications without difficulties and is otherwise without complaint today.   Past Medical History:  Diagnosis Date   Arthritis    Back pain    Bruises easily    Contact lens/glasses fitting    Cough    Diabetes mellitus     Fatigue    Hyperlipidemia    Hypertension    Poor circulation    Sinus problem    Sleep apnea    SOB (shortness of breath)    Past Surgical History:  Procedure Laterality Date   CATARACT EXTRACTION     confirm date with patient   CHOLECYSTECTOMY  1987   EYE SURGERY  2003   retina   HERNIA REPAIR     LAPAROSCOPIC GASTRIC BANDING  03/22/10   PACEMAKER IMPLANT N/A 06/11/2020   Procedure: PACEMAKER IMPLANT;  Surgeon: Lanier Prude, MD;  Location: [facility];  Service: Cardiovascular;  Laterality: N/A;    Current Outpatient Medications  Medication Sig Dispense Refill   acetaminophen (TYLENOL) 325 MG tablet Take 650 mg by mouth in the morning and at bedtime.      albuterol (VENTOLIN HFA) 108 (90 Base) MCG/ACT inhaler Inhale 2 puffs into the lungs every 6 (six) hours as needed for wheezing or shortness of breath.      apixaban (ELIQUIS) 5 MG TABS tablet Take 1 tablet (5 mg total) by mouth 2 (two) times daily. 60 tablet 6   Ascorbic Acid (VITA-C PO) Take 1 tablet by mouth daily. Vitamin C     diclofenac sodium (VOLTAREN) 1 % GEL Apply 4 g topically every 6 (six) hours as needed (  pain).      furosemide (LASIX) 40 MG tablet Take 40 mg by mouth daily as needed for fluid or edema.     insulin NPH-regular Human (70-30) 100 UNIT/ML injection Inject 55-90 Units into the skin 2 (two) times daily with a meal. Per sliding scale : not provided     Iron, Ferrous Sulfate, 325 (65 Fe) MG TABS Take 325 mg by mouth daily.      lisinopril-hydrochlorothiazide (ZESTORETIC) 10-12.5 MG tablet Take 1 tablet by mouth daily.     MELATONIN ER PO Take 10 mg by mouth at bedtime.     omeprazole (PRILOSEC) 40 MG capsule Take 1 capsule by mouth daily.     ondansetron (ZOFRAN-ODT) 4 MG disintegrating tablet Take 4 mg by mouth every 8 (eight) hours as needed for nausea or vomiting.      pantoprazole (PROTONIX) 40 MG tablet Take 40 mg by mouth daily.     Probiotic Product (PROBIOTIC DAILY PO) Take 1 capsule by  mouth daily in the afternoon.     rOPINIRole (REQUIP) 4 MG tablet Take 4 mg by mouth at bedtime.      traMADol (ULTRAM) 50 MG tablet Take 50 mg by mouth every 6 (six) hours.     Vitamin D, Ergocalciferol, (DRISDOL) 1.25 MG (50000 UNIT) CAPS capsule Take 50,000 Units by mouth once a week. Sunday     No current facility-administered medications for this encounter.    Allergies  Allergen Reactions   Penicillins Hives    At injection site only.   Sulfa Antibiotics Hives   Erythromycin Other (See Comments)   Ciprofloxacin Hcl     Other reaction(s): Abdominal Pain    Social History   Socioeconomic History   Marital status: Married    Spouse name: Not on file   Number of children: Not on file   Years of education: Not on file   Highest education level: Not on file  Occupational History   Not on file  Tobacco Use   Smoking status: Former    Types: Cigarettes    Quit date: 2001    Years since quitting: 21.5   Smokeless tobacco: Never  Vaping Use   Vaping Use: Never used  Substance and Sexual Activity   Alcohol use: No   Drug use: No   Sexual activity: Not on file  Other Topics Concern   Not on file  Social History Narrative   Not on file   Social Determinants of Health   Financial Resource Strain: Not on file  Food Insecurity: Not on file  Transportation Needs: Not on file  Physical Activity: Not on file  Stress: Not on file  Social Connections: Not on file  Intimate Partner Violence: Not on file    Family History  Problem Relation Age of Onset   Arthritis Mother    Heart disease Mother    Hypertension Mother    Heart disease Father    Diabetes Father    Diabetes Sister     ROS- All systems are reviewed and negative except as per the HPI above  Physical Exam: Vitals:   01/27/21 1328  BP: 124/60  Pulse: 70  Weight: (!) 139 kg  Height: 5' 2.99" (1.6 m)   Wt Readings from Last 3 Encounters:  01/27/21 (!) 139 kg  01/12/21 (!) 137 kg  12/29/20 136.1 kg     Labs: Lab Results  Component Value Date   NA 137 06/12/2020   K 5.3 (H) 06/12/2020  CL 101 06/12/2020   CO2 28 06/12/2020   GLUCOSE 328 (H) 06/12/2020   BUN 32 (H) 06/12/2020   CREATININE 1.30 (H) 06/12/2020   CALCIUM 8.9 06/12/2020   No results found for: INR No results found for: CHOL, HDL, LDLCALC, TRIG   GEN- The patient is well appearing, alert and oriented x 3 today.   Head- normocephalic, atraumatic Eyes-  Sclera clear, conjunctiva pink Ears- hearing intact Oropharynx- clear Neck- supple, no JVP Lymph- no cervical lymphadenopathy Lungs- Clear to ausculation bilaterally, normal work of breathing Heart- regular rate and rhythm, no murmurs, rubs or gallops, PMI not laterally displaced GI- soft, NT, ND, + BS Extremities- no clubbing, cyanosis, or edema MS- no significant deformity or atrophy Skin- no rash or lesion Psych- euthymic mood, full affect Neuro- strength and sensation are intact  EKG-  v paced at 70 bpm, qrs int 166 ms, qtc 494 ms   Echo-2021-1. Technically difficult echo with poor image quality. 2. Left ventricular ejection fraction, by estimation, is 55 to 60%. The left ventricle has normal function. The left ventricle has no regional wall motion abnormalities. Left ventricular diastolic parameters are indeterminate. 3. Right ventricular systolic function is normal. The right ventricular size is normal. 4. The mitral valve is normal in structure. No evidence of mitral valve regurgitation. 5. The aortic valve is normal in structure. Aortic valve regurgitation is not visualized.  Assessment and Plan:   1. New onset  afib/flutter  V rates controlled  Will not start rate control at this time  Pt stated on last that afib was not making her symptomatic and she did not want to  pursue CV, but with issues last night with orthopnea and PND, 5 lb wt gain she will pursue.   Risk vrs benefit explained  She will go home today and take her lasix.   2.  CHA2DS2VASc  score of 5 Continue  eliquis 5 mg bid, has not missed any doses   3. BMI 53.16 Weight loss encouraged  Pt basically is sedentary, w/c bound for most part  No alcohol or tobacco use   I will see back in one week after cardioversion    Lupita Leash [facility] [address] [place], [place] [address] [phone]

## 2021-01-27 NOTE — Telephone Encounter (Signed)
AF alerts disabled in [facility] per Dr. Geannie Risen orders.

## 2021-01-28 ENCOUNTER — Encounter (HOSPITAL_COMMUNITY): Payer: Self-pay | Admitting: *Deleted

## 2021-02-01 ENCOUNTER — Ambulatory Visit: Payer: Medicare Other

## 2021-02-01 ENCOUNTER — Telehealth: Payer: Self-pay | Admitting: Cardiology

## 2021-02-01 NOTE — Telephone Encounter (Signed)
Holly Spence is calling stating the Afib clinic requested she come in to our office tomorrow and have an EKG performed to see if her HR has gone back up to determine if she is still in Afib. She states she is scheduled for a Cardioversion and they are trying to determine if it is still needed. Please advise.

## 2021-02-01 NOTE — Telephone Encounter (Signed)
Spoke to the patient just now and she is going to come in tomorrow at 2pm to get her EKG done.    Encouraged patient to call back with any questions or concerns.

## 2021-02-02 ENCOUNTER — Ambulatory Visit (INDEPENDENT_AMBULATORY_CARE_PROVIDER_SITE_OTHER): Payer: Medicare Other

## 2021-02-02 ENCOUNTER — Other Ambulatory Visit: Payer: Self-pay

## 2021-02-02 ENCOUNTER — Telehealth (HOSPITAL_COMMUNITY): Payer: Self-pay | Admitting: *Deleted

## 2021-02-02 VITALS — BP 120/60 | HR 91 | Ht 62.99 in | Wt 307.0 lb

## 2021-02-02 DIAGNOSIS — I4891 Unspecified atrial fibrillation: Secondary | ICD-10-CM | POA: Diagnosis not present

## 2021-02-02 NOTE — Telephone Encounter (Signed)
AF alerts programmed on remotely per Dr. Lalla Brothers order.

## 2021-02-02 NOTE — Telephone Encounter (Signed)
Confirmed EKG with Jorja Loa PA. Pt in NSR. Pt notified. Will cancel follow up here but requesting follow up with Dr. Lalla Brothers in 2-3 months. Pt verbalized agreement.

## 2021-02-02 NOTE — Progress Notes (Signed)
Patient came in today at the request of the A-fib clinic to have an EKG completed. The CMA completed the EKG and has scanned it into the chart for review. Dr. Tomie China has requested that we please send a message to the A-fib clinic to let them know that this was completed so that they can review.

## 2021-02-07 ENCOUNTER — Ambulatory Visit (HOSPITAL_COMMUNITY): Admission: RE | Admit: 2021-02-07 | Payer: Medicare Other | Source: Home / Self Care | Admitting: Cardiology

## 2021-02-07 ENCOUNTER — Encounter (HOSPITAL_COMMUNITY): Admission: RE | Payer: Self-pay | Source: Home / Self Care

## 2021-02-07 SURGERY — CARDIOVERSION
Anesthesia: General

## 2021-02-16 DIAGNOSIS — I442 Atrioventricular block, complete: Secondary | ICD-10-CM | POA: Diagnosis not present

## 2021-02-16 DIAGNOSIS — G2581 Restless legs syndrome: Secondary | ICD-10-CM | POA: Diagnosis not present

## 2021-02-16 DIAGNOSIS — E559 Vitamin D deficiency, unspecified: Secondary | ICD-10-CM | POA: Diagnosis not present

## 2021-02-16 DIAGNOSIS — K219 Gastro-esophageal reflux disease without esophagitis: Secondary | ICD-10-CM | POA: Diagnosis not present

## 2021-02-16 DIAGNOSIS — G8929 Other chronic pain: Secondary | ICD-10-CM | POA: Diagnosis not present

## 2021-02-16 DIAGNOSIS — I10 Essential (primary) hypertension: Secondary | ICD-10-CM | POA: Diagnosis not present

## 2021-02-16 DIAGNOSIS — D509 Iron deficiency anemia, unspecified: Secondary | ICD-10-CM | POA: Diagnosis not present

## 2021-02-17 ENCOUNTER — Ambulatory Visit (HOSPITAL_COMMUNITY): Payer: Medicare Other | Admitting: Nurse Practitioner

## 2021-02-18 ENCOUNTER — Ambulatory Visit (HOSPITAL_COMMUNITY): Payer: Medicare Other | Admitting: Nurse Practitioner

## 2021-03-15 ENCOUNTER — Ambulatory Visit (INDEPENDENT_AMBULATORY_CARE_PROVIDER_SITE_OTHER): Payer: Medicare Other

## 2021-03-15 DIAGNOSIS — I442 Atrioventricular block, complete: Secondary | ICD-10-CM | POA: Diagnosis not present

## 2021-03-15 LAB — CUP PACEART REMOTE DEVICE CHECK
Battery Remaining Longevity: 62 mo
Battery Remaining Percentage: 91 %
Battery Voltage: 2.99 V
Brady Statistic AP VP Percent: 5 %
Brady Statistic AP VS Percent: 1 %
Brady Statistic AS VP Percent: 94 %
Brady Statistic AS VS Percent: 1 %
Brady Statistic RA Percent Paced: 4.4 %
Brady Statistic RV Percent Paced: 98 %
Date Time Interrogation Session: 20220905020015
Implantable Lead Implant Date: 20211203
Implantable Lead Implant Date: 20211203
Implantable Lead Location: 753859
Implantable Lead Location: 753860
Implantable Pulse Generator Implant Date: 20211203
Lead Channel Impedance Value: 410 Ohm
Lead Channel Impedance Value: 680 Ohm
Lead Channel Pacing Threshold Amplitude: 0.5 V
Lead Channel Pacing Threshold Amplitude: 1.25 V
Lead Channel Pacing Threshold Pulse Width: 0.5 ms
Lead Channel Pacing Threshold Pulse Width: 0.5 ms
Lead Channel Sensing Intrinsic Amplitude: 0.9 mV
Lead Channel Sensing Intrinsic Amplitude: 7.9 mV
Lead Channel Setting Pacing Amplitude: 3.5 V
Lead Channel Setting Pacing Amplitude: 3.5 V
Lead Channel Setting Pacing Pulse Width: 0.5 ms
Lead Channel Setting Sensing Sensitivity: 2 mV
Pulse Gen Model: 2272
Pulse Gen Serial Number: 3879909

## 2021-03-23 NOTE — Progress Notes (Signed)
Remote pacemaker transmission.   

## 2021-03-29 ENCOUNTER — Encounter: Payer: Medicare Other | Admitting: Cardiology

## 2021-03-29 DIAGNOSIS — J019 Acute sinusitis, unspecified: Secondary | ICD-10-CM | POA: Diagnosis not present

## 2021-03-29 DIAGNOSIS — Z112 Encounter for screening for other bacterial diseases: Secondary | ICD-10-CM | POA: Diagnosis not present

## 2021-03-29 DIAGNOSIS — Z7689 Persons encountering health services in other specified circumstances: Secondary | ICD-10-CM | POA: Diagnosis not present

## 2021-03-29 DIAGNOSIS — R110 Nausea: Secondary | ICD-10-CM | POA: Diagnosis not present

## 2021-03-30 DIAGNOSIS — U071 COVID-19: Secondary | ICD-10-CM | POA: Diagnosis not present

## 2021-04-21 DIAGNOSIS — E785 Hyperlipidemia, unspecified: Secondary | ICD-10-CM | POA: Diagnosis not present

## 2021-04-21 DIAGNOSIS — E119 Type 2 diabetes mellitus without complications: Secondary | ICD-10-CM | POA: Diagnosis not present

## 2021-04-21 DIAGNOSIS — Z794 Long term (current) use of insulin: Secondary | ICD-10-CM | POA: Diagnosis not present

## 2021-06-08 DIAGNOSIS — I10 Essential (primary) hypertension: Secondary | ICD-10-CM | POA: Diagnosis not present

## 2021-06-08 DIAGNOSIS — E1142 Type 2 diabetes mellitus with diabetic polyneuropathy: Secondary | ICD-10-CM | POA: Diagnosis not present

## 2021-06-13 ENCOUNTER — Ambulatory Visit (INDEPENDENT_AMBULATORY_CARE_PROVIDER_SITE_OTHER): Payer: Medicare Other

## 2021-06-13 DIAGNOSIS — I442 Atrioventricular block, complete: Secondary | ICD-10-CM

## 2021-06-13 LAB — CUP PACEART REMOTE DEVICE CHECK
Battery Remaining Longevity: 55 mo
Battery Remaining Percentage: 87 %
Battery Voltage: 2.99 V
Brady Statistic AP VP Percent: 5.3 %
Brady Statistic AP VS Percent: 1 %
Brady Statistic AS VP Percent: 94 %
Brady Statistic AS VS Percent: 1 %
Brady Statistic RA Percent Paced: 4.9 %
Brady Statistic RV Percent Paced: 98 %
Date Time Interrogation Session: 20221205020013
Implantable Lead Implant Date: 20211203
Implantable Lead Implant Date: 20211203
Implantable Lead Location: 753859
Implantable Lead Location: 753860
Implantable Pulse Generator Implant Date: 20211203
Lead Channel Impedance Value: 390 Ohm
Lead Channel Impedance Value: 580 Ohm
Lead Channel Pacing Threshold Amplitude: 0.5 V
Lead Channel Pacing Threshold Amplitude: 1.25 V
Lead Channel Pacing Threshold Pulse Width: 0.5 ms
Lead Channel Pacing Threshold Pulse Width: 0.5 ms
Lead Channel Sensing Intrinsic Amplitude: 0.7 mV
Lead Channel Sensing Intrinsic Amplitude: 5.7 mV
Lead Channel Setting Pacing Amplitude: 3.5 V
Lead Channel Setting Pacing Amplitude: 3.5 V
Lead Channel Setting Pacing Pulse Width: 0.5 ms
Lead Channel Setting Sensing Sensitivity: 2 mV
Pulse Gen Model: 2272
Pulse Gen Serial Number: 3879909

## 2021-06-22 NOTE — Progress Notes (Signed)
Remote pacemaker transmission.   

## 2021-07-20 ENCOUNTER — Other Ambulatory Visit (HOSPITAL_COMMUNITY): Payer: Self-pay | Admitting: Nurse Practitioner

## 2021-08-16 ENCOUNTER — Other Ambulatory Visit: Payer: Self-pay

## 2021-08-16 DIAGNOSIS — I739 Peripheral vascular disease, unspecified: Secondary | ICD-10-CM

## 2021-08-18 NOTE — Progress Notes (Signed)
Office Note     CC: Lateral lower extremity toe discoloration cool to touch Requesting Provider:  Earlyne Iba, NP  HPI: Holly Spence is a 76 y.o. ([birth year]) female presenting at the request of .Earlyne Iba, NP for cold toes bilaterally with discoloration when in the dependent position.  On exam, Holly Spence was doing well, accompanied by her husband.  She has several medical conditions including diabetes, back pain, arthritis, morbid obesity, obstructive sleep apnea. Holly Spence appreciated discoloration of toes over a year ago, however she has been asymptomatic.  She noted they were cool to touch, and was concerned that she had circulation issues.  She is predominantly in a wheelchair, in the dependent position.  She is able to stand to ambulate to the restroom and back.  However this has become more difficult with her obesity.  Holly Spence denies claudication, she does not mobilize more than a few feet.  She denies rest pain and, tissue loss in the feet.  The toe discoloration is appreciated when in the dependent position, with the bluish hue dissipating when feet are elevated.  She denies blanching or pain associated with cold temperatures, caffeine intake, stress.  The pt is not on a statin for cholesterol management.  The pt is not on a daily aspirin.   Other AC:  Eliquis The pt is on medication for hypertension.   The pt is diabetic.  Tobacco hx:  former  Past Medical History:  Diagnosis Date   Arthritis    Back pain    Bruises easily    Contact lens/glasses fitting    Cough    Diabetes mellitus    Fatigue    Hyperlipidemia    Hypertension    Poor circulation    Sinus problem    Sleep apnea    SOB (shortness of breath)     Past Surgical History:  Procedure Laterality Date   CATARACT EXTRACTION     confirm date with patient   CHOLECYSTECTOMY  1987   EYE SURGERY  2003   retina   HERNIA REPAIR     LAPAROSCOPIC GASTRIC BANDING  03/22/10   PACEMAKER IMPLANT N/A 06/11/2020    Procedure: PACEMAKER IMPLANT;  Surgeon: Vickie Epley, MD;  Location: [facility];  Service: Cardiovascular;  Laterality: N/A;    Social History   Socioeconomic History   Marital status: Married    Spouse name: Not on file   Number of children: Not on file   Years of education: Not on file   Highest education level: Not on file  Occupational History   Not on file  Tobacco Use   Smoking status: Former    Types: Cigarettes    Quit date: 2001    Years since quitting: 22.1   Smokeless tobacco: Never  Vaping Use   Vaping Use: Never used  Substance and Sexual Activity   Alcohol use: No   Drug use: No   Sexual activity: Not on file  Other Topics Concern   Not on file  Social History Narrative   Not on file   Social Determinants of Health   Financial Resource Strain: Not on file  Food Insecurity: Not on file  Transportation Needs: Not on file  Physical Activity: Not on file  Stress: Not on file  Social Connections: Not on file  Intimate Partner Violence: Not on file    Family History  Problem Relation Age of Onset   Arthritis Mother    Heart disease Mother  Hypertension Mother    Heart disease Father    Diabetes Father    Diabetes Sister     Current Outpatient Medications  Medication Sig Dispense Refill   acetaminophen (TYLENOL) 500 MG tablet Take 1,000 mg by mouth in the morning and at bedtime.     albuterol (VENTOLIN HFA) 108 (90 Base) MCG/ACT inhaler Inhale 2 puffs into the lungs every 6 (six) hours as needed for wheezing or shortness of breath.      apixaban (ELIQUIS) 5 MG TABS tablet Take 1 tablet (5 mg total) by mouth 2 (two) times daily. Appointment Required For Further Refills [phone] 60 tablet 1   Ascorbic Acid (VITA-C PO) Take 1 tablet by mouth daily. Vitamin C     diclofenac sodium (VOLTAREN) 1 % GEL Apply 4 g topically every 6 (six) hours as needed (pain).      furosemide (LASIX) 40 MG tablet Take 40 mg by mouth daily as needed for  fluid or edema.     insulin NPH-regular Human (70-30) 100 UNIT/ML injection Inject 55-90 Units into the skin 2 (two) times daily with a meal. Per sliding scale : not provided     ipratropium-albuterol (DUONEB) 0.5-2.5 (3) MG/3ML SOLN Take 3 mLs by nebulization every 6 (six) hours as needed (Asthma).     Iron, Ferrous Sulfate, 325 (65 Fe) MG TABS Take 325 mg by mouth daily.      lisinopril-hydrochlorothiazide (ZESTORETIC) 10-12.5 MG tablet Take 1 tablet by mouth daily.     MELATONIN ER PO Take 10 mg by mouth at bedtime.     omeprazole (PRILOSEC) 40 MG capsule Take 40 mg by mouth daily.     ondansetron (ZOFRAN-ODT) 4 MG disintegrating tablet Take 4 mg by mouth every 8 (eight) hours as needed for nausea or vomiting.      pantoprazole (PROTONIX) 40 MG tablet Take 40 mg by mouth daily.     Probiotic Product (PROBIOTIC DAILY PO) Take 1 capsule by mouth daily in the afternoon.     rOPINIRole (REQUIP) 4 MG tablet Take 4 mg by mouth at bedtime.      traMADol (ULTRAM) 50 MG tablet Take 50 mg by mouth every 6 (six) hours as needed for severe pain.     Vitamin D, Ergocalciferol, (DRISDOL) 1.25 MG (50000 UNIT) CAPS capsule Take 50,000 Units by mouth every Sunday.     No current facility-administered medications for this visit.    Allergies  Allergen Reactions   Penicillins Hives    At injection site only.   Sulfa Antibiotics Hives   Erythromycin Other (See Comments)    Unsure    Ciprofloxacin Hcl     Other reaction(s): Abdominal Pain     REVIEW OF SYSTEMS:   [X]  denotes positive finding, [ ]  denotes negative finding Cardiac  Comments:  Chest pain or chest pressure:    Shortness of breath upon exertion:    Short of breath when lying flat:    Irregular heart rhythm:        Vascular    Pain in calf, thigh, or hip brought on by ambulation:    Pain in feet at night that wakes you up from your sleep:     Blood clot in your veins:    Leg swelling:         Pulmonary    Oxygen at home:     Productive cough:     Wheezing:         Neurologic    Sudden weakness  in arms or legs:     Sudden numbness in arms or legs:     Sudden onset of difficulty speaking or slurred speech:    Temporary loss of vision in one eye:     Problems with dizziness:         Gastrointestinal    Blood in stool:     Vomited blood:         Genitourinary    Burning when urinating:     Blood in urine:        Psychiatric    Major depression:         Hematologic    Bleeding problems:    Problems with blood clotting too easily:        Skin    Rashes or ulcers:        Constitutional    Fever or chills:      PHYSICAL EXAMINATION:  There were no vitals filed for this visit.  General:  WDWN in NAD; vital signs documented above, morbid obesity Gait: Not observed HENT: WNL, normocephalic Pulmonary: normal non-labored breathing , without wheezing Cardiac: regular HR Abdomen: soft, NT, no masses Skin: without rashes Vascular Exam/Pulses:  Right Left  Radial 2+ (normal) 2+ (normal)  Ulnar 2+ (normal) 2+ (normal)  Femoral    Popliteal    DP 2+ (normal) 2+ (normal)  PT     Extremities:  without ischemic changes, without Gangrene , without cellulitis; without open wounds;  Musculoskeletal: no muscle wasting or atrophy  Neurologic: A&O X 3;  No focal weakness or paresthesias are detected Psychiatric:  The pt has Normal affect.   Non-Invasive Vascular Imaging:   Summary:  Right: Resting right ankle-brachial index is within normal range. No  evidence of significant right lower extremity arterial disease. The right  toe-brachial index is normal.   Left: Resting left ankle-brachial index is within normal range. No  evidence of significant left lower extremity arterial disease. The left  toe-brachial index is normal.     ASSESSMENT/PLAN: Holly Spence is a 77 y.o. female presenting with feet that are cool to touch and a bluish hue that is present when in the dependent position.  ABIs  demonstrate normal perfusion to bilateral lower extremities.  On physical exam, she has a palpable pulse.  She does not have signs or symptoms consistent with Raynaud's phenomenon.   Toe discoloration is likely from venous stasis, however she does not have any appreciable telangiectasias or varicose veins.  The discoloration is not a product of poor perfusion.  I had a long conversation with Holly Spence regarding the above.  She would benefit from improve mobility, and weight loss.  The toe discoloration is not life or limb threatening.  Being that her perfusion is normal, she can follow-up my office as needed.   Broadus John, MD [facility] [phone]

## 2021-08-19 ENCOUNTER — Ambulatory Visit (HOSPITAL_COMMUNITY)
Admission: RE | Admit: 2021-08-19 | Discharge: 2021-08-19 | Disposition: A | Discharge status: Home / Self Care | Payer: Medicare Other | Source: Ambulatory Visit | Attending: Vascular Surgery | Admitting: Vascular Surgery

## 2021-08-19 ENCOUNTER — Ambulatory Visit: Payer: Medicare Other | Admitting: Vascular Surgery

## 2021-08-19 ENCOUNTER — Other Ambulatory Visit: Payer: Self-pay

## 2021-08-19 ENCOUNTER — Encounter: Payer: Self-pay | Admitting: Vascular Surgery

## 2021-08-19 VITALS — BP 149/71 | HR 84 | Temp 97.9°F | Resp 14 | Ht 63.0 in | Wt 319.0 lb

## 2021-08-19 DIAGNOSIS — I739 Peripheral vascular disease, unspecified: Secondary | ICD-10-CM | POA: Insufficient documentation

## 2021-08-19 DIAGNOSIS — R209 Unspecified disturbances of skin sensation: Secondary | ICD-10-CM | POA: Diagnosis not present

## 2021-09-12 ENCOUNTER — Ambulatory Visit (INDEPENDENT_AMBULATORY_CARE_PROVIDER_SITE_OTHER): Payer: Medicare Other

## 2021-09-12 DIAGNOSIS — I442 Atrioventricular block, complete: Secondary | ICD-10-CM

## 2021-09-12 LAB — CUP PACEART REMOTE DEVICE CHECK
Battery Remaining Longevity: 55 mo
Battery Remaining Percentage: 83 %
Battery Voltage: 2.98 V
Brady Statistic AP VP Percent: 8.6 %
Brady Statistic AP VS Percent: 1 %
Brady Statistic AS VP Percent: 89 %
Brady Statistic AS VS Percent: 1 %
Brady Statistic RA Percent Paced: 8.3 %
Brady Statistic RV Percent Paced: 97 %
Date Time Interrogation Session: 20230306025232
Implantable Lead Implant Date: 20211203
Implantable Lead Implant Date: 20211203
Implantable Lead Location: 753859
Implantable Lead Location: 753860
Implantable Pulse Generator Implant Date: 20211203
Lead Channel Impedance Value: 410 Ohm
Lead Channel Impedance Value: 640 Ohm
Lead Channel Pacing Threshold Amplitude: 0.5 V
Lead Channel Pacing Threshold Amplitude: 1.25 V
Lead Channel Pacing Threshold Pulse Width: 0.5 ms
Lead Channel Pacing Threshold Pulse Width: 0.5 ms
Lead Channel Sensing Intrinsic Amplitude: 0.7 mV
Lead Channel Sensing Intrinsic Amplitude: 5.8 mV
Lead Channel Setting Pacing Amplitude: 3.5 V
Lead Channel Setting Pacing Amplitude: 3.5 V
Lead Channel Setting Pacing Pulse Width: 0.5 ms
Lead Channel Setting Sensing Sensitivity: 2 mV
Pulse Gen Model: 2272
Pulse Gen Serial Number: 3879909

## 2021-09-21 NOTE — Progress Notes (Signed)
Remote pacemaker transmission.   

## 2021-09-26 ENCOUNTER — Emergency Department (HOSPITAL_COMMUNITY): Payer: Medicare Other

## 2021-09-26 ENCOUNTER — Emergency Department (HOSPITAL_COMMUNITY)
Admission: EM | Admit: 2021-09-26 | Discharge: 2021-09-26 | Discharge status: Against Medical Advice | Payer: Medicare Other | Attending: Emergency Medicine | Admitting: Emergency Medicine

## 2021-09-26 ENCOUNTER — Other Ambulatory Visit: Payer: Self-pay

## 2021-09-26 DIAGNOSIS — I10 Essential (primary) hypertension: Secondary | ICD-10-CM | POA: Diagnosis not present

## 2021-09-26 DIAGNOSIS — Z5321 Procedure and treatment not carried out due to patient leaving prior to being seen by health care provider: Secondary | ICD-10-CM | POA: Insufficient documentation

## 2021-09-26 DIAGNOSIS — E119 Type 2 diabetes mellitus without complications: Secondary | ICD-10-CM | POA: Diagnosis not present

## 2021-09-26 DIAGNOSIS — M25512 Pain in left shoulder: Secondary | ICD-10-CM | POA: Insufficient documentation

## 2021-09-26 DIAGNOSIS — R9431 Abnormal electrocardiogram [ECG] [EKG]: Secondary | ICD-10-CM | POA: Insufficient documentation

## 2021-09-26 LAB — TROPONIN I (HIGH SENSITIVITY)
Troponin I (High Sensitivity): 13 ng/L (ref <18)
Troponin I (High Sensitivity): 11 ng/L (ref <18)

## 2021-09-26 LAB — BASIC METABOLIC PANEL
Anion gap: 9 (ref 5–15)
BUN: 22 mg/dL (ref 8–23)
CO2: 27 mmol/L (ref 22–32)
Calcium: 9 mg/dL (ref 8.9–10.3)
Chloride: 101 mmol/L (ref 98–111)
Creatinine, Ser: 1.23 mg/dL — ABNORMAL HIGH (ref 0.44–1.00)
GFR, Estimated: 45 mL/min — ABNORMAL LOW (ref >60)
Glucose, Bld: 153 mg/dL — ABNORMAL HIGH (ref 70–99)
Potassium: 4.3 mmol/L (ref 3.5–5.1)
Sodium: 137 mmol/L (ref 135–145)

## 2021-09-26 LAB — CBC
HCT: 47.8 % — ABNORMAL HIGH (ref 36.0–46.0)
Hemoglobin: 13.8 g/dL (ref 12.0–15.0)
MCH: 25.7 pg — ABNORMAL LOW (ref 26.0–34.0)
MCHC: 28.9 g/dL — ABNORMAL LOW (ref 30.0–36.0)
MCV: 89 fL (ref 80.0–100.0)
Platelets: 251 10*3/uL (ref 150–400)
RBC: 5.37 MIL/uL — ABNORMAL HIGH (ref 3.87–5.11)
RDW: 15.3 % (ref 11.5–15.5)
WBC: 9.4 10*3/uL (ref 4.0–10.5)
nRBC: 0 % (ref 0.0–0.2)

## 2021-09-26 NOTE — ED Provider Triage Note (Signed)
Emergency Medicine Provider Triage Evaluation Note ? ?Holly Spence , a 76 y.o. female  was evaluated in triage.  Pt complains of left shoulder pain onset 2 months ago.  He notes that his left shoulder pain has been bothering him since he had his flu vaccine to that arm.  Patient was evaluated by his primary care provider today for similar symptoms and was sent to the ED due to abnormalities found on her EKG.  She is not aware of the abnormalities that they found on the EKG.  She does not wear oxygen at baseline.  Her primary care provider put her on oxygen prior to calling EMS. She denies chest pain, shortness of breath, fever, chills, abdominal pain, nausea, vomiting.  Patient has a history of diabetes, hypertension, high cholesterol and is compliant with her medications. ? ?Review of Systems  ?Positive: As per HPI above ?Negative: As per HPI above ? ?Physical Exam  ?BP 131/65 (BP Location: Right Arm)   Pulse (!) 102   Temp 98.4 ?F (36.9 ?C) (Oral)   Resp 18   SpO2 95%  ?Gen:   Awake, no distress   ?Resp:  Normal effort  ?MSK:   Moves extremities without difficulty  ?Other:  No chest wall tenderness to palpation.  Tenderness to palpation noted to anterior left shoulder without overlying skin changes. ? ?Medical Decision Making  ?Medically screening exam initiated at 12:22 PM.  Appropriate orders placed.  [name] was informed that the remainder of the evaluation will be completed by another provider, this initial triage assessment does not replace that evaluation, and the importance of remaining in the ED until their evaluation is complete. ?  ?Yasira Engelson A, PA-C ?09/26/21 1222 ? ?

## 2021-09-26 NOTE — ED Triage Notes (Signed)
Pt with L shoulder pain since getting flu shot two months ago. Pt went to PCP today for same who sent her to ED for abnormalities in EKG since her last EKg two years ago.  ?

## 2021-09-26 NOTE — ED Notes (Signed)
Pt is leaving. 

## 2021-09-27 ENCOUNTER — Encounter: Payer: Self-pay | Admitting: Cardiology

## 2021-11-07 DIAGNOSIS — J349 Unspecified disorder of nose and nasal sinuses: Secondary | ICD-10-CM | POA: Insufficient documentation

## 2021-11-07 DIAGNOSIS — R5383 Other fatigue: Secondary | ICD-10-CM | POA: Insufficient documentation

## 2021-11-07 DIAGNOSIS — R0602 Shortness of breath: Secondary | ICD-10-CM | POA: Insufficient documentation

## 2021-11-07 DIAGNOSIS — M1990 Unspecified osteoarthritis, unspecified site: Secondary | ICD-10-CM | POA: Insufficient documentation

## 2021-11-07 DIAGNOSIS — R233 Spontaneous ecchymoses: Secondary | ICD-10-CM | POA: Insufficient documentation

## 2021-11-07 DIAGNOSIS — R059 Cough, unspecified: Secondary | ICD-10-CM | POA: Insufficient documentation

## 2021-11-07 DIAGNOSIS — R0989 Other specified symptoms and signs involving the circulatory and respiratory systems: Secondary | ICD-10-CM | POA: Insufficient documentation

## 2021-11-07 DIAGNOSIS — M549 Dorsalgia, unspecified: Secondary | ICD-10-CM | POA: Insufficient documentation

## 2021-11-07 DIAGNOSIS — R0600 Dyspnea, unspecified: Secondary | ICD-10-CM | POA: Insufficient documentation

## 2021-11-07 DIAGNOSIS — G4730 Sleep apnea, unspecified: Secondary | ICD-10-CM | POA: Insufficient documentation

## 2021-11-07 DIAGNOSIS — Z460 Encounter for fitting and adjustment of spectacles and contact lenses: Secondary | ICD-10-CM | POA: Insufficient documentation

## 2021-11-07 HISTORY — DX: Other abnormalities of breathing: R06.00

## 2021-11-08 ENCOUNTER — Ambulatory Visit: Payer: Medicare Other | Admitting: Cardiology

## 2021-11-08 ENCOUNTER — Encounter: Payer: Self-pay | Admitting: Cardiology

## 2021-11-08 VITALS — BP 118/64 | HR 94 | Ht 63.0 in | Wt 296.2 lb

## 2021-11-08 DIAGNOSIS — I10 Essential (primary) hypertension: Secondary | ICD-10-CM | POA: Diagnosis not present

## 2021-11-08 DIAGNOSIS — E088 Diabetes mellitus due to underlying condition with unspecified complications: Secondary | ICD-10-CM

## 2021-11-08 DIAGNOSIS — I442 Atrioventricular block, complete: Secondary | ICD-10-CM

## 2021-11-08 DIAGNOSIS — E782 Mixed hyperlipidemia: Secondary | ICD-10-CM

## 2021-11-08 MED ORDER — METOPROLOL SUCCINATE ER 25 MG PO TB24: 25.0000 mg | ORAL_TABLET | Freq: Every day | ORAL | 3 refills | Status: DC

## 2021-11-08 NOTE — Progress Notes (Signed)
?Cardiology Office Note:   ? ?Date:  11/08/2021  ? ?ID:  Holly Spence, DOB 1946/02/13, MRN [id number] ? ?PCP:  Imagene Riches, NP  ?Cardiologist:  Jenean Lindau, MD  ? ?Referring MD: Imagene Riches, NP  ? ? ?ASSESSMENT:   ? ?1. Benign essential hypertension   ?2. Mixed hyperlipidemia   ?3. Complete heart block ([place])   ?4. Diabetes mellitus due to underlying condition with unspecified complications ([place])   ? ?PLAN:   ? ?In order of problems listed above: ? ?Primary prevention stressed with the patient.  Importance of compliance with diet medication stressed and she vocalized understanding. ?Essential hypertension: Blood pressure stable and diet was emphasized.  Because of history of palpitations and fast heart rate I did a Toprol-XL 25 mg daily for her.  I reviewed the pacemaker evaluations by her partner. ?Paroxysmal atrial fibrillation:I discussed with the patient atrial fibrillation, disease process. Management and therapy including rate and rhythm control, anticoagulation benefits and potential risks were discussed extensively with the patient. Patient had multiple questions which were answered to patient's satisfaction. ?Dyslipidemia, diabetes mellitus and morbid obesity: Lifestyle modification urged and she promises to do better.  Risks of obesity explained. ?Permanent pacemaker for complete heart block: Electrophysiology colleague and department notes reviewed.  Questions were answered to her satisfaction. ?Patient will be seen in follow-up appointment in 6 months or earlier if the patient has any concerns ? ? ? ?Medication Adjustments/Labs and Tests Ordered: ?Current medicines are reviewed at length with the patient today.  Concerns regarding medicines are outlined above.  ?No orders of the defined types were placed in this encounter. ? ?No orders of the defined types were placed in this encounter. ? ? ? ?No chief complaint on file. ?  ? ?History of Present Illness:   ? ?Holly Spence is a 76 y.o. female.   Patient has past medical history of essential hypertension, dyslipidemia and diabetes mellitus.  She had a complete heart block with pacemaker insertion.  She is morbidly obese with BMI greater than 40.  She leads a sedentary lifestyle.  The only complaint she has is that she does not feel good at times.  She has very vague symptoms.  No chest pain orthopnea or PND.  She has tachycardia at times.  He is on anticoagulation for atrial fibrillation according to the history provided by her.  At the time of my evaluation, the patient is alert awake oriented and in no distress. ? ?Past Medical History:  ?Diagnosis Date  ? Allergic rhinitis 09/20/2015  ? Arthritis   ? Ascending aorta dilation ([place]) 12/09/2018  ? Back pain   ? Benign essential hypertension 09/20/2015  ? Bruises easily   ? Complete heart block ([place]) 06/11/2020  ? Contact lens/glasses fitting   ? Cough   ? Depression 02/08/2019  ? Diabetes mellitus due to underlying condition with unspecified complications ([place]) [id number]  ? Duodenal ulcer 02/08/2019  ? Fatigue   ? Gastroesophageal reflux disease 09/20/2015  ? GI bleeding 06/27/2019  ? Hx of laparoscopic gastric banding 01/31/2011  ? Surgery: 03/22/10   ? Hyperlipidemia   ? Insulin long-term use ([place]) 09/20/2015  ? Melena 02/08/2019  ? Moderate asthma 02/08/2019  ? Palpitations 11/10/2015  ? Poor circulation   ? Right renal mass 02/08/2019  ? Sinus problem   ? Sleep apnea   ? SOB (shortness of breath)   ? Symptomatic bradycardia 06/11/2020  ? Uncontrolled type 2 diabetes mellitus without complication, with long-term current  use of insulin 09/20/2015  ? ? ?Past Surgical History:  ?Procedure Laterality Date  ? CATARACT EXTRACTION    ? confirm date with patient  ? CHOLECYSTECTOMY  1987  ? EYE SURGERY  2003  ? retina  ? HERNIA REPAIR    ? LAPAROSCOPIC GASTRIC BANDING  03/22/10  ? PACEMAKER IMPLANT N/A 06/11/2020  ? Procedure: PACEMAKER IMPLANT;  Surgeon: Vickie Epley, MD;  Location: [facility];  Service: Cardiovascular;   Laterality: N/A;  ? ? ?Current Medications: ?Current Meds  ?Medication Sig  ? acetaminophen (TYLENOL) 500 MG tablet Take 1,000 mg by mouth in the morning and at bedtime.  ? albuterol (VENTOLIN HFA) 108 (90 Base) MCG/ACT inhaler Inhale 2 puffs into the lungs every 6 (six) hours as needed for wheezing or shortness of breath.   ? apixaban (ELIQUIS) 5 MG TABS tablet Take 1 tablet (5 mg total) by mouth 2 (two) times daily. Appointment Required For Further Refills [phone]  ? Ascorbic Acid (VITA-C PO) Take 1 tablet by mouth daily. Vitamin C  ? cyanocobalamin 1000 MCG tablet Take 1,000 mcg by mouth daily.  ? diclofenac sodium (VOLTAREN) 1 % GEL Apply 4 g topically every 6 (six) hours as needed (pain).   ? empagliflozin (JARDIANCE) 25 MG TABS tablet Take 25 mg by mouth daily.  ? furosemide (LASIX) 40 MG tablet Take 40 mg by mouth daily as needed for fluid or edema.  ? insulin NPH-regular Human (70-30) 100 UNIT/ML injection Inject 55-90 Units into the skin 2 (two) times daily with a meal. Per sliding scale : not provided  ? ipratropium-albuterol (DUONEB) 0.5-2.5 (3) MG/3ML SOLN Take 3 mLs by nebulization every 6 (six) hours as needed (Asthma).  ? Iron, Ferrous Sulfate, 325 (65 Fe) MG TABS Take 325 mg by mouth daily.   ? lisinopril-hydrochlorothiazide (ZESTORETIC) 10-12.5 MG tablet Take 1 tablet by mouth daily.  ? MELATONIN ER PO Take 10 mg by mouth at bedtime.  ? omeprazole (PRILOSEC) 40 MG capsule Take 40 mg by mouth daily.  ? ondansetron (ZOFRAN-ODT) 4 MG disintegrating tablet Take 4 mg by mouth every 8 (eight) hours as needed for nausea or vomiting.   ? pantoprazole (PROTONIX) 40 MG tablet Take 40 mg by mouth daily.  ? Probiotic Product (PROBIOTIC DAILY PO) Take 1 capsule by mouth daily in the afternoon.  ? rOPINIRole (REQUIP) 4 MG tablet Take 4 mg by mouth at bedtime.   ? rosuvastatin (CRESTOR) 10 MG tablet Take 10 mg by mouth at bedtime.  ? traMADol (ULTRAM) 50 MG tablet Take 50 mg by mouth every 6 (six) hours as  needed for severe pain.  ? Vitamin D, Ergocalciferol, (DRISDOL) 1.25 MG (50000 UNIT) CAPS capsule Take 50,000 Units by mouth every Sunday.  ?  ? ?Allergies:   Penicillins, Sulfa antibiotics, Erythromycin, and Ciprofloxacin hcl  ? ?Social History  ? ?Socioeconomic History  ? Marital status: Married  ?  Spouse name: Not on file  ? Number of children: Not on file  ? Years of education: Not on file  ? Highest education level: Not on file  ?Occupational History  ? Not on file  ?Tobacco Use  ? Smoking status: Former  ?  Types: Cigarettes  ?  Quit date: 2001  ?  Years since quitting: 22.3  ? Smokeless tobacco: Never  ?Vaping Use  ? Vaping Use: Never used  ?Substance and Sexual Activity  ? Alcohol use: No  ? Drug use: No  ? Sexual activity: Not on file  ?  Other Topics Concern  ? Not on file  ?Social History Narrative  ? Not on file  ? ?Social Determinants of Health  ? ?Financial Resource Strain: Not on file  ?Food Insecurity: Not on file  ?Transportation Needs: Not on file  ?Physical Activity: Not on file  ?Stress: Not on file  ?Social Connections: Not on file  ?  ? ?Family History: ?The patient's family history includes Arthritis in her mother; Diabetes in her father and sister; Heart disease in her father and mother; Hypertension in her mother. ? ?ROS:   ?Please see the history of present illness.    ?All other systems reviewed and are negative. ? ?EKGs/Labs/Other Studies Reviewed:   ? ?The following studies were reviewed today: ?I discussed my findings with the patient at length. ? ? ?Recent Labs: ?09/26/2021: BUN 22; Creatinine, Ser 1.23; Hemoglobin 13.8; Platelets 251; Potassium 4.3; Sodium 137  ?Recent Lipid Panel ?No results found for: CHOL, TRIG, HDL, CHOLHDL, VLDL, LDLCALC, LDLDIRECT ? ?Physical Exam:   ? ?VS:  BP 118/64   Pulse 94   Ht 5\' 3"  (1.6 m)   Wt 296 lb 3.2 oz (134.4 kg)   SpO2 93%   BMI 52.47 kg/m?    ? ?Wt Readings from Last 3 Encounters:  ?11/08/21 296 lb 3.2 oz (134.4 kg)  ?08/19/21 (!) 319 lb  (144.7 kg)  ?02/02/21 (!) 307 lb (139.3 kg)  ?  ? ?GEN: Patient is in no acute distress ?HEENT: Normal ?NECK: No JVD; No carotid bruits ?LYMPHATICS: No lymphadenopathy ?CARDIAC: Hear sounds regular, 2/6 systolic mu

## 2021-11-08 NOTE — Patient Instructions (Signed)
Medication Instructions:  ?Your physician has recommended you make the following change in your medication:  ? ?Start Toprol XL 25 mg daily. ? ?*If you need a refill on your cardiac medications before your next appointment, please call your pharmacy* ? ? ?Lab Work: ?None ordered ?If you have labs (blood work) drawn today and your tests are completely normal, you will receive your results only by: ?MyChart Message (if you have MyChart) OR ?A paper copy in the mail ?If you have any lab test that is abnormal or we need to change your treatment, we will call you to review the results. ? ? ?Testing/Procedures: ?None ordered ? ? ?Follow-Up: ?At [facility], you and your health needs are our priority.  As part of our continuing mission to provide you with exceptional heart care, we have created designated Provider Care Teams.  These Care Teams include your primary Cardiologist (physician) and Advanced Practice Providers (APPs -  Physician Assistants and Nurse Practitioners) who all work together to provide you with the care you need, when you need it. ? ?We recommend signing up for the patient portal called "MyChart".  Sign up information is provided on this After Visit Summary.  MyChart is used to connect with patients for Virtual Visits (Telemedicine).  Patients are able to view lab/test results, encounter notes, upcoming appointments, etc.  Non-urgent messages can be sent to your provider as well.   ?To learn more about what you can do with MyChart, go to [email or web address].   ? ?Your next appointment:   ?4 month(s) ? ?The format for your next appointment:   ?In Person ? ?Provider:   ?Belva Crome, MD ? ? ?Other Instructions ?NA ? ?

## 2021-12-12 ENCOUNTER — Ambulatory Visit (INDEPENDENT_AMBULATORY_CARE_PROVIDER_SITE_OTHER): Payer: Medicare Other

## 2021-12-12 DIAGNOSIS — I442 Atrioventricular block, complete: Secondary | ICD-10-CM | POA: Diagnosis not present

## 2021-12-12 LAB — CUP PACEART REMOTE DEVICE CHECK
Battery Remaining Longevity: 53 mo
Battery Remaining Percentage: 79 %
Battery Voltage: 2.99 V
Brady Statistic AP VP Percent: 10 %
Brady Statistic AP VS Percent: 1 %
Brady Statistic AS VP Percent: 88 %
Brady Statistic AS VS Percent: 1 %
Brady Statistic RA Percent Paced: 10 %
Brady Statistic RV Percent Paced: 97 %
Date Time Interrogation Session: 20230605020014
Implantable Lead Implant Date: 20211203
Implantable Lead Implant Date: 20211203
Implantable Lead Location: 753859
Implantable Lead Location: 753860
Implantable Pulse Generator Implant Date: 20211203
Lead Channel Impedance Value: 410 Ohm
Lead Channel Impedance Value: 700 Ohm
Lead Channel Pacing Threshold Amplitude: 0.5 V
Lead Channel Pacing Threshold Amplitude: 1.25 V
Lead Channel Pacing Threshold Pulse Width: 0.5 ms
Lead Channel Pacing Threshold Pulse Width: 0.5 ms
Lead Channel Sensing Intrinsic Amplitude: 0.6 mV
Lead Channel Sensing Intrinsic Amplitude: 4.5 mV
Lead Channel Setting Pacing Amplitude: 3.5 V
Lead Channel Setting Pacing Amplitude: 3.5 V
Lead Channel Setting Pacing Pulse Width: 0.5 ms
Lead Channel Setting Sensing Sensitivity: 2 mV
Pulse Gen Model: 2272
Pulse Gen Serial Number: 3879909

## 2021-12-28 NOTE — Progress Notes (Signed)
Remote pacemaker transmission.   

## 2022-03-14 ENCOUNTER — Ambulatory Visit (INDEPENDENT_AMBULATORY_CARE_PROVIDER_SITE_OTHER): Payer: Medicare Other

## 2022-03-14 ENCOUNTER — Ambulatory Visit: Payer: Medicare Other | Admitting: Cardiology

## 2022-03-14 ENCOUNTER — Telehealth: Payer: Self-pay

## 2022-03-14 DIAGNOSIS — I442 Atrioventricular block, complete: Secondary | ICD-10-CM | POA: Diagnosis not present

## 2022-03-14 LAB — CUP PACEART REMOTE DEVICE CHECK
Battery Remaining Longevity: 49 mo
Battery Remaining Percentage: 75 %
Battery Voltage: 2.98 V
Brady Statistic AP VP Percent: 11 %
Brady Statistic AP VS Percent: 1 %
Brady Statistic AS VP Percent: 87 %
Brady Statistic AS VS Percent: 1 %
Brady Statistic RA Percent Paced: 10 %
Brady Statistic RV Percent Paced: 98 %
Date Time Interrogation Session: 20230904020012
Implantable Lead Implant Date: 20211203
Implantable Lead Implant Date: 20211203
Implantable Lead Location: 753859
Implantable Lead Location: 753860
Implantable Pulse Generator Implant Date: 20211203
Lead Channel Impedance Value: 360 Ohm
Lead Channel Impedance Value: 640 Ohm
Lead Channel Pacing Threshold Amplitude: 0.5 V
Lead Channel Pacing Threshold Amplitude: 1.25 V
Lead Channel Pacing Threshold Pulse Width: 0.5 ms
Lead Channel Pacing Threshold Pulse Width: 0.5 ms
Lead Channel Sensing Intrinsic Amplitude: 0.6 mV
Lead Channel Sensing Intrinsic Amplitude: 12 mV
Lead Channel Setting Pacing Amplitude: 3.5 V
Lead Channel Setting Pacing Amplitude: 3.5 V
Lead Channel Setting Pacing Pulse Width: 0.5 ms
Lead Channel Setting Sensing Sensitivity: 2 mV
Pulse Gen Model: 2272
Pulse Gen Serial Number: 3879909

## 2022-03-14 NOTE — Telephone Encounter (Signed)
Pt has not seen EP for follow up since PPM implant.  Remote reviewed with [employer] rep.  Appears Pt has RA undersensing.  Recommend decreasing A sensitivity to 0.3 or 0.4 mv.  Sending to scheduler.  Scheduled remote reviewed. Normal device function.   AF burden 6.8%, longest < 5 min. Presenting rhythm appears c/w intermittent RA undersensing and ? failure to capture. P waves measuring 0.63mv, programmed 0.68mv. No auto RA pacing threshold. Lead impedance stable.

## 2022-03-26 NOTE — Progress Notes (Deleted)
Cardiology Office Note Date:  03/26/2022  Patient ID:  Holly Spence, Holly Spence 03/31/1946, MRN [id number] PCP:  Erskine Emery, NP  Cardiologist:  Dr. Tomie China Electrophysiologist: Dr. Lalla Brothers  ***refresh   Chief Complaint: *** over due  History of Present Illness: Holly Spence is a 76 y.o. female with history of morbid obesity (h/o lap band many years ago), hiatal hernia, chronic bronchitis, asthma (quit smoking 2001), self described as essentially wheelchair bound 2/2 terrible back problems, HTN, HLD, DM, CHB w/PPM  She has seen AFib clinic a few times, initially did not want to pursue DCCV/rhythm control with no perceived symptoms with AFib though eventually at her last visit 01/27/21, she started to develop trouble with volume OL and decided to pursue DCCV though had spontaneous conversion prior to DCCV.  She saw Dr. Tomie China May 2023 added Toprol to her regime for BP and palpitations.  Device clinic on a remote noted likely P wave undersensing and advised she come in for evaluation and reprogramming.  *** symptoms *** bleeding, eliquis, dose *** burden   Device information Abbott dual chamber PPM implanted 06/11/2020  AFib/flutter/AAD hx: Diagnosed June 2022   Past Medical History:  Diagnosis Date   Allergic rhinitis 09/20/2015   Arthritis    Ascending aorta dilation ([facility]) 12/09/2018   Back pain    Benign essential hypertension 09/20/2015   Bruises easily    Complete heart block ([facility]) 06/11/2020   Contact lens/glasses fitting    Cough    Depression 02/08/2019   Diabetes mellitus due to underlying condition with unspecified complications ([facility]) 11/10/2015   Duodenal ulcer 02/08/2019   Fatigue    Gastroesophageal reflux disease 09/20/2015   GI bleeding 06/27/2019   Hx of laparoscopic gastric banding 01/31/2011   Surgery: 03/22/10    Hyperlipidemia    Insulin long-term use ([facility]) 09/20/2015   Melena 02/08/2019   Moderate asthma 02/08/2019   Palpitations 11/10/2015   Poor  circulation    Right renal mass 02/08/2019   Sinus problem    Sleep apnea    SOB (shortness of breath)    Symptomatic bradycardia 06/11/2020   Uncontrolled type 2 diabetes mellitus without complication, with long-term current use of insulin 09/20/2015    Past Surgical History:  Procedure Laterality Date   CATARACT EXTRACTION     confirm date with patient   CHOLECYSTECTOMY  1987   EYE SURGERY  2003   retina   HERNIA REPAIR     LAPAROSCOPIC GASTRIC BANDING  03/22/10   PACEMAKER IMPLANT N/A 06/11/2020   Procedure: PACEMAKER IMPLANT;  Surgeon: Lanier Prude, MD;  Location: [facility];  Service: Cardiovascular;  Laterality: N/A;    Current Outpatient Medications  Medication Sig Dispense Refill   acetaminophen (TYLENOL) 500 MG tablet Take 1,000 mg by mouth in the morning and at bedtime.     albuterol (VENTOLIN HFA) 108 (90 Base) MCG/ACT inhaler Inhale 2 puffs into the lungs every 6 (six) hours as needed for wheezing or shortness of breath.      apixaban (ELIQUIS) 5 MG TABS tablet Take 1 tablet (5 mg total) by mouth 2 (two) times daily. Appointment Required For Further Refills [phone] 60 tablet 1   Ascorbic Acid (VITA-C PO) Take 1 tablet by mouth daily. Vitamin C     cyanocobalamin 1000 MCG tablet Take 1,000 mcg by mouth daily.     diclofenac sodium (VOLTAREN) 1 % GEL Apply 4 g topically every 6 (six) hours as needed (pain).  empagliflozin (JARDIANCE) 25 MG TABS tablet Take 25 mg by mouth daily.     furosemide (LASIX) 40 MG tablet Take 40 mg by mouth daily as needed for fluid or edema.     insulin NPH-regular Human (70-30) 100 UNIT/ML injection Inject 55-90 Units into the skin 2 (two) times daily with a meal. Per sliding scale : not provided     ipratropium-albuterol (DUONEB) 0.5-2.5 (3) MG/3ML SOLN Take 3 mLs by nebulization every 6 (six) hours as needed (Asthma).     Iron, Ferrous Sulfate, 325 (65 Fe) MG TABS Take 325 mg by mouth daily.       lisinopril-hydrochlorothiazide (ZESTORETIC) 10-12.5 MG tablet Take 1 tablet by mouth daily.     MELATONIN ER PO Take 10 mg by mouth at bedtime.     metoprolol succinate (TOPROL XL) 25 MG 24 hr tablet Take 1 tablet (25 mg total) by mouth daily. 90 tablet 3   omeprazole (PRILOSEC) 40 MG capsule Take 40 mg by mouth daily.     ondansetron (ZOFRAN-ODT) 4 MG disintegrating tablet Take 4 mg by mouth every 8 (eight) hours as needed for nausea or vomiting.      pantoprazole (PROTONIX) 40 MG tablet Take 40 mg by mouth daily.     Probiotic Product (PROBIOTIC DAILY PO) Take 1 capsule by mouth daily in the afternoon.     rOPINIRole (REQUIP) 4 MG tablet Take 4 mg by mouth at bedtime.      rosuvastatin (CRESTOR) 10 MG tablet Take 10 mg by mouth at bedtime.     traMADol (ULTRAM) 50 MG tablet Take 50 mg by mouth every 6 (six) hours as needed for severe pain.     Vitamin D, Ergocalciferol, (DRISDOL) 1.25 MG (50000 UNIT) CAPS capsule Take 50,000 Units by mouth every Sunday.     No current facility-administered medications for this visit.    Allergies:   Penicillins, Sulfa antibiotics, Erythromycin, and Ciprofloxacin hcl   Social History:  The patient  reports that she quit smoking about 22 years ago. Her smoking use included cigarettes. She has never used smokeless tobacco. She reports that she does not drink alcohol and does not use drugs.   Family History:  The patient's family history includes Arthritis in her mother; Diabetes in her father and sister; Heart disease in her father and mother; Hypertension in her mother.  ROS:  Please see the history of present illness.    All other systems are reviewed and otherwise negative.   PHYSICAL EXAM:  VS:  There were no vitals taken for this visit. BMI: There is no height or weight on file to calculate BMI. Well nourished, well developed, in no acute distress HEENT: normocephalic, atraumatic Neck: no JVD, carotid bruits or masses Cardiac:  *** RRR; no  significant murmurs, no rubs, or gallops Lungs:  *** CTA b/l, no wheezing, rhonchi or rales Abd: soft, nontender MS: no deformity or *** atrophy Ext: *** no edema Skin: warm and dry, no rash Neuro:  No gross deficits appreciated Psych: euthymic mood, full affect  *** PPM site is stable, no tethering or discomfort   EKG:  not done today  Device interrogation done today and reviewed by myself:  ***   Echocardiogram 06/11/20: 1. Technically difficult echo with poor image quality.   2. Left ventricular ejection fraction, by estimation, is 55 to 60%. The  left ventricle has normal function. The left ventricle has no regional  wall motion abnormalities. Left ventricular diastolic parameters are  indeterminate.  3. Right ventricular systolic function is normal. The right ventricular  size is normal.   4. The mitral valve is normal in structure. No evidence of mitral valve  regurgitation.   5. The aortic valve is normal in structure. Aortic valve regurgitation is  not visualized.   12/05/2018: TTE IMPRESSIONS  1. The left ventricle has normal systolic function with an ejection  fraction of 60-65%. The cavity size was normal. There is mildly increased  left ventricular wall thickness. Left ventricular diastolic parameters  were normal.   2. The right ventricle has normal systolic function. The cavity was  normal. There is no increase in right ventricular wall thickness.   3. The tricuspid valve is grossly normal.   4. The aortic valve was not well visualized. Aortic valve regurgitation  was not assessed by color flow Doppler.   5. There is mild dilatation of the ascending aorta measuring 38 mm.  Recent Labs: 09/26/2021: BUN 22; Creatinine, Ser 1.23; Hemoglobin 13.8; Platelets 251; Potassium 4.3; Sodium 137  No results found for requested labs within last 365 days.   CrCl cannot be calculated (Patient's most recent lab result is older than the maximum 21 days allowed.).   Wt  Readings from Last 3 Encounters:  11/08/21 296 lb 3.2 oz (134.4 kg)  08/19/21 (!) 319 lb (144.7 kg)  02/02/21 (!) 307 lb (139.3 kg)     Other studies reviewed: Additional studies/records reviewed today include: summarized above  ASSESSMENT AND PLAN:  PPM ***  Persistent AFib, Aflutter CHA2DS2Vasc is 5, on Eliquisd, *** appropriately dosed *** % buredn  3.   HTN ***  Disposition: F/u with ***  Current medicines are reviewed at length with the patient today.  The patient did not have any concerns regarding medicines.  Venetia Night, PA-C 03/26/2022 3:35 PM     [address] [place] [id number] [phone] (office)  [phone] (fax)

## 2022-03-28 ENCOUNTER — Encounter: Payer: Medicare Other | Admitting: Physician Assistant

## 2022-04-04 NOTE — Progress Notes (Signed)
Remote pacemaker transmission.   

## 2022-06-12 ENCOUNTER — Ambulatory Visit (INDEPENDENT_AMBULATORY_CARE_PROVIDER_SITE_OTHER): Payer: Medicare Other

## 2022-06-12 DIAGNOSIS — I442 Atrioventricular block, complete: Secondary | ICD-10-CM

## 2022-06-12 LAB — CUP PACEART REMOTE DEVICE CHECK
Battery Remaining Longevity: 44 mo
Battery Remaining Percentage: 71 %
Battery Voltage: 2.98 V
Brady Statistic AP VP Percent: 14 %
Brady Statistic AP VS Percent: 1 %
Brady Statistic AS VP Percent: 85 %
Brady Statistic AS VS Percent: 1 %
Brady Statistic RA Percent Paced: 13 %
Brady Statistic RV Percent Paced: 98 %
Date Time Interrogation Session: 20231204030218
Implantable Lead Connection Status: 753985
Implantable Lead Connection Status: 753985
Implantable Lead Implant Date: 20211203
Implantable Lead Implant Date: 20211203
Implantable Lead Location: 753859
Implantable Lead Location: 753860
Implantable Pulse Generator Implant Date: 20211203
Lead Channel Impedance Value: 360 Ohm
Lead Channel Impedance Value: 580 Ohm
Lead Channel Pacing Threshold Amplitude: 0.5 V
Lead Channel Pacing Threshold Amplitude: 1.25 V
Lead Channel Pacing Threshold Pulse Width: 0.5 ms
Lead Channel Pacing Threshold Pulse Width: 0.5 ms
Lead Channel Sensing Intrinsic Amplitude: 0.5 mV
Lead Channel Sensing Intrinsic Amplitude: 4.3 mV
Lead Channel Setting Pacing Amplitude: 3.5 V
Lead Channel Setting Pacing Amplitude: 3.5 V
Lead Channel Setting Pacing Pulse Width: 0.5 ms
Lead Channel Setting Sensing Sensitivity: 2 mV
Pulse Gen Model: 2272
Pulse Gen Serial Number: 3879909

## 2022-07-15 ENCOUNTER — Emergency Department (HOSPITAL_COMMUNITY)
Admission: EM | Admit: 2022-07-15 | Discharge: 2022-07-15 | Disposition: A | Discharge status: Home / Self Care | Payer: Medicare Other | Attending: Emergency Medicine | Admitting: Emergency Medicine

## 2022-07-15 ENCOUNTER — Other Ambulatory Visit: Payer: Self-pay

## 2022-07-15 ENCOUNTER — Emergency Department (HOSPITAL_COMMUNITY): Payer: Medicare Other

## 2022-07-15 DIAGNOSIS — Z79899 Other long term (current) drug therapy: Secondary | ICD-10-CM | POA: Insufficient documentation

## 2022-07-15 DIAGNOSIS — N189 Chronic kidney disease, unspecified: Secondary | ICD-10-CM | POA: Insufficient documentation

## 2022-07-15 DIAGNOSIS — Z950 Presence of cardiac pacemaker: Secondary | ICD-10-CM | POA: Insufficient documentation

## 2022-07-15 DIAGNOSIS — Z7901 Long term (current) use of anticoagulants: Secondary | ICD-10-CM | POA: Insufficient documentation

## 2022-07-15 DIAGNOSIS — R058 Other specified cough: Secondary | ICD-10-CM | POA: Insufficient documentation

## 2022-07-15 DIAGNOSIS — M7989 Other specified soft tissue disorders: Secondary | ICD-10-CM | POA: Diagnosis present

## 2022-07-15 DIAGNOSIS — R600 Localized edema: Secondary | ICD-10-CM | POA: Diagnosis not present

## 2022-07-15 DIAGNOSIS — D692 Other nonthrombocytopenic purpura: Secondary | ICD-10-CM | POA: Insufficient documentation

## 2022-07-15 DIAGNOSIS — I129 Hypertensive chronic kidney disease with stage 1 through stage 4 chronic kidney disease, or unspecified chronic kidney disease: Secondary | ICD-10-CM | POA: Insufficient documentation

## 2022-07-15 DIAGNOSIS — Z1152 Encounter for screening for COVID-19: Secondary | ICD-10-CM | POA: Diagnosis not present

## 2022-07-15 DIAGNOSIS — E1122 Type 2 diabetes mellitus with diabetic chronic kidney disease: Secondary | ICD-10-CM | POA: Insufficient documentation

## 2022-07-15 DIAGNOSIS — Z794 Long term (current) use of insulin: Secondary | ICD-10-CM | POA: Insufficient documentation

## 2022-07-15 LAB — URINALYSIS, ROUTINE W REFLEX MICROSCOPIC
Bilirubin Urine: NEGATIVE
Glucose, UA: >=500 mg/dL — AB
Ketones, ur: NEGATIVE mg/dL
Nitrite: NEGATIVE
Protein, ur: NEGATIVE mg/dL
Specific Gravity, Urine: 1.014 (ref 1.005–1.030)
WBC, UA: >50 WBC/hpf — ABNORMAL HIGH (ref 0–5)
pH: 5 (ref 5.0–8.0)

## 2022-07-15 LAB — COMPREHENSIVE METABOLIC PANEL
ALT: 13 U/L (ref 0–44)
AST: 14 U/L — ABNORMAL LOW (ref 15–41)
Albumin: 2.7 g/dL — ABNORMAL LOW (ref 3.5–5.0)
Alkaline Phosphatase: 74 U/L (ref 38–126)
Anion gap: 7 (ref 5–15)
BUN: 22 mg/dL (ref 8–23)
CO2: 27 mmol/L (ref 22–32)
Calcium: 8.5 mg/dL — ABNORMAL LOW (ref 8.9–10.3)
Chloride: 103 mmol/L (ref 98–111)
Creatinine, Ser: 1.34 mg/dL — ABNORMAL HIGH (ref 0.44–1.00)
GFR, Estimated: 41 mL/min — ABNORMAL LOW (ref >60)
Glucose, Bld: 115 mg/dL — ABNORMAL HIGH (ref 70–99)
Potassium: 4.2 mmol/L (ref 3.5–5.1)
Sodium: 137 mmol/L (ref 135–145)
Total Bilirubin: 0.4 mg/dL (ref 0.3–1.2)
Total Protein: 6.4 g/dL — ABNORMAL LOW (ref 6.5–8.1)

## 2022-07-15 LAB — CBC WITH DIFFERENTIAL/PLATELET
Abs Immature Granulocytes: 0.1 10*3/uL — ABNORMAL HIGH (ref 0.00–0.07)
Basophils Absolute: 0 10*3/uL (ref 0.0–0.1)
Basophils Relative: 1 %
Eosinophils Absolute: 0.1 10*3/uL (ref 0.0–0.5)
Eosinophils Relative: 1 %
HCT: 41.6 % (ref 36.0–46.0)
Hemoglobin: 12.6 g/dL (ref 12.0–15.0)
Immature Granulocytes: 1 %
Lymphocytes Relative: 17 %
Lymphs Abs: 1.5 10*3/uL (ref 0.7–4.0)
MCH: 27.6 pg (ref 26.0–34.0)
MCHC: 30.3 g/dL (ref 30.0–36.0)
MCV: 91 fL (ref 80.0–100.0)
Monocytes Absolute: 0.6 10*3/uL (ref 0.1–1.0)
Monocytes Relative: 7 %
Neutro Abs: 6.3 10*3/uL (ref 1.7–7.7)
Neutrophils Relative %: 73 %
Platelets: 229 10*3/uL (ref 150–400)
RBC: 4.57 MIL/uL (ref 3.87–5.11)
RDW: 14.7 % (ref 11.5–15.5)
WBC: 8.6 10*3/uL (ref 4.0–10.5)
nRBC: 0 % (ref 0.0–0.2)

## 2022-07-15 LAB — RESP PANEL BY RT-PCR (RSV, FLU A&B, COVID)  RVPGX2
Influenza A by PCR: NEGATIVE
Influenza B by PCR: NEGATIVE
Resp Syncytial Virus by PCR: NEGATIVE
SARS Coronavirus 2 by RT PCR: NEGATIVE

## 2022-07-15 LAB — APTT: aPTT: 30 seconds (ref 24–36)

## 2022-07-15 LAB — BRAIN NATRIURETIC PEPTIDE: B Natriuretic Peptide: 168.6 pg/mL — ABNORMAL HIGH (ref 0.0–100.0)

## 2022-07-15 LAB — LACTIC ACID, PLASMA
Lactic Acid, Venous: 1.1 mmol/L (ref 0.5–1.9)
Lactic Acid, Venous: 1.9 mmol/L (ref 0.5–1.9)

## 2022-07-15 LAB — PROTIME-INR
INR: 1 (ref 0.8–1.2)
Prothrombin Time: 13.4 seconds (ref 11.4–15.2)

## 2022-07-15 NOTE — ED Triage Notes (Signed)
Patient reports bilateral lower legs swelling/edema onset last night , abdominal swelling noted at triage .

## 2022-07-15 NOTE — ED Provider Notes (Signed)
[facility] EMERGENCY DEPARTMENT Provider Note   CSN: [id number] Arrival date & time: 07/15/22  1813     History  Chief Complaint  Patient presents with   Lower Legs Swelling/Edema    Holly Spence is a 77 y.o. female.  HPI 77 year old female with a history of diabetes, hypertension, previous gastric banding, complete heart block with a pacemaker, ascending aorta dilation presents with leg swelling.  She states she noticed it today and they have been discolored and swollen and uncomfortable.  She got started on an antibiotic for a respiratory infection and is on her third day of it.  She tells me it is azithromycin.  She has been dealing with about 2 weeks of cough with green sputum.  She does not feel like she is more short of breath today and there is no chest pain.  Home Medications Prior to Admission medications   Medication Sig Start Date End Date Taking? Authorizing Provider  acetaminophen (TYLENOL) 500 MG tablet Take 1,000 mg by mouth in the morning and at bedtime.    [provider]  albuterol (VENTOLIN HFA) 108 (90 Base) MCG/ACT inhaler Inhale 2 puffs into the lungs every 6 (six) hours as needed for wheezing or shortness of breath.  04/02/20   [provider]  apixaban (ELIQUIS) 5 MG TABS tablet Take 1 tablet (5 mg total) by mouth 2 (two) times daily. Appointment Required For Further Refills [phone] 07/20/21   Sherran Needs, NP  Ascorbic Acid (VITA-C PO) Take 1 tablet by mouth daily. Vitamin C    [provider]  cyanocobalamin 1000 MCG tablet Take 1,000 mcg by mouth daily.    [provider]  diclofenac sodium (VOLTAREN) 1 % GEL Apply 4 g topically every 6 (six) hours as needed (pain).  10/23/18   [provider]  empagliflozin (JARDIANCE) 25 MG TABS tablet Take 25 mg by mouth daily.    [provider]  furosemide (LASIX) 40 MG tablet Take 40 mg by mouth daily as needed for fluid or edema.     [provider]  insulin NPH-regular Human (70-30) 100 UNIT/ML injection Inject 55-90 Units into the skin 2 (two) times daily with a meal. Per sliding scale : not provided    [provider]  ipratropium-albuterol (DUONEB) 0.5-2.5 (3) MG/3ML SOLN Take 3 mLs by nebulization every 6 (six) hours as needed (Asthma).    [provider]  Iron, Ferrous Sulfate, 325 (65 Fe) MG TABS Take 325 mg by mouth daily.     [provider]  lisinopril-hydrochlorothiazide (ZESTORETIC) 10-12.5 MG tablet Take 1 tablet by mouth daily. 01/14/20   [provider]  MELATONIN ER PO Take 10 mg by mouth at bedtime.    [provider]  metoprolol succinate (TOPROL XL) 25 MG 24 hr tablet Take 1 tablet (25 mg total) by mouth daily. 11/08/21   Revankar, Reita Cliche, MD  omeprazole (PRILOSEC) 40 MG capsule Take 40 mg by mouth daily. 12/29/20   [provider]  ondansetron (ZOFRAN-ODT) 4 MG disintegrating tablet Take 4 mg by mouth every 8 (eight) hours as needed for nausea or vomiting.  06/24/19   [provider]  pantoprazole (PROTONIX) 40 MG tablet Take 40 mg by mouth daily.    [provider]  Probiotic Product (PROBIOTIC DAILY PO) Take 1 capsule by mouth daily in the afternoon.    [provider]  rOPINIRole (REQUIP) 4 MG tablet Take 4 mg by mouth at bedtime.  10/22/18   [provider]  rosuvastatin (CRESTOR) 10 MG tablet Take 10 mg by mouth at bedtime. 11/07/21   [provider]  traMADol (ULTRAM) 50 MG tablet Take 50 mg by mouth every 6 (six) hours as needed for severe pain. 12/14/14   [provider]  Vitamin D, Ergocalciferol, (DRISDOL) 1.25 MG (50000 UNIT) CAPS capsule Take 50,000 Units by mouth every Sunday. 01/14/20   [provider]      Allergies    Penicillins, Sulfa antibiotics, Erythromycin, and Ciprofloxacin hcl    Review of Systems   Review of Systems  Constitutional:  Negative for fever.   Respiratory:  Positive for cough. Negative for shortness of breath.   Cardiovascular:  Positive for leg swelling. Negative for chest pain.  Skin:  Positive for color change.    Physical Exam Updated Vital Signs BP (!) 110/47   Pulse 63   Temp 98.6 F (37 C) (Oral)   Resp 15   SpO2 96%  Physical Exam Vitals and nursing note reviewed.  Constitutional:      Appearance: She is well-developed.  HENT:     Head: Normocephalic and atraumatic.  Cardiovascular:     Rate and Rhythm: Normal rate and regular rhythm.     Heart sounds: Normal heart sounds.  Pulmonary:     Effort: Pulmonary effort is normal.     Breath sounds: Normal breath sounds.  Abdominal:     Palpations: Abdomen is soft.     Tenderness: There is no abdominal tenderness.  Musculoskeletal:     Right lower leg: Edema present.     Left lower leg: Edema present.     Comments: See picture below.  Has some mild pitting edema in the erythematous areas.  Some mild discomfort with this but no overt tenderness or significant pain.  Skin:    General: Skin is warm and dry.  Neurological:     Mental Status: She is alert.     ED Results / Procedures / Treatments   Labs (all labs ordered are listed, but only abnormal results are displayed) Labs Reviewed  COMPREHENSIVE METABOLIC PANEL - Abnormal; Notable for the following components:      Result Value   Glucose, Bld 115 (*)    Creatinine, Ser 1.34 (*)    Calcium 8.5 (*)    Total Protein 6.4 (*)    Albumin 2.7 (*)    AST 14 (*)    GFR, Estimated 41 (*)    All other components within normal limits  CBC WITH DIFFERENTIAL/PLATELET - Abnormal; Notable for the following components:   Abs Immature Granulocytes 0.10 (*)    All other components within normal limits  URINALYSIS, ROUTINE W REFLEX MICROSCOPIC - Abnormal; Notable for the following components:   APPearance CLOUDY (*)    Glucose, UA >=500 (*)    Hgb urine dipstick SMALL (*)    Leukocytes,Ua LARGE (*)    WBC, UA  >50 (*)    Bacteria, UA RARE (*)    All other components within normal limits  BRAIN NATRIURETIC PEPTIDE - Abnormal; Notable for the following components:   B Natriuretic Peptide 168.6 (*)    All other components within normal limits  RESP PANEL BY RT-PCR (RSV, FLU A&B, COVID)  RVPGX2  CULTURE, BLOOD (ROUTINE X 2)  CULTURE, BLOOD (ROUTINE X 2)  URINE CULTURE  LACTIC ACID, PLASMA  LACTIC ACID, PLASMA  PROTIME-INR  APTT    EKG EKG Interpretation  Date/Time:  Saturday July 15 2022 20:16:56 EST Ventricular Rate:  93 PR Interval:  217 QRS Duration: 165 QT Interval:  400 QTC Calculation: 498 R Axis:   -85 Text Interpretation: Sinus rhythm Atrial premature complex Borderline prolonged PR interval IVCD, consider atypical RBBB Left ventricular hypertrophy similar to Mar 2023 Confirmed by Pricilla Loveless [phone]) on 07/15/2022 8:30:25 PM  Radiology DG Chest 2 View  Result Date: 07/15/2022 CLINICAL DATA:  Cough with leg swelling EXAM: CHEST - 2 VIEW COMPARISON:  Chest x-ray 09/26/2021 FINDINGS: Left-sided pacemaker is again noted. The heart size and mediastinal contours are within normal limits. Both lungs are clear. The visualized skeletal structures are unremarkable. IMPRESSION: No active cardiopulmonary disease. Electronically Signed   By: Darliss Cheney M.D.   On: 07/15/2022 21:16    Procedures Procedures    Medications Ordered in ED Medications - No data to display  ED Course/ Medical Decision Making/ A&P                           Medical Decision Making Amount and/or Complexity of Data Reviewed Labs: ordered.    Details: Normal WBC, normal lactate, chronic kidney disease is unchanged.  Platelets are normal.  INR, PTT are normal.  BNP is in an indeterminate range. Radiology: ordered and independent interpretation performed.    Details: Chest x-ray without pneumonia ECG/medicine tests: independent interpretation performed.    Details: No acute ischemia.   Patient has a  rash to her lower extremities.  It has not progressively worsened since she noticed it this morning.  It does feel like the lesions are palpable so this could be purpura.  However there are no alarming findings otherwise including no fever, illness, low platelets, abnormal coagulation factors, etc.  Renal function is baseline.  I doubt she has a severe case or need for transfer to a tertiary care center or admission.  I do not know that this is definitively caused by the azithromycin but with no pneumonia and otherwise well appearance I think it is reasonable to stop the azithromycin in case this is the cause.  There is no other evidence of purpura on her body.  There was a note from triage that she had some rash to her abdomen but I did not see this on my exam.  Otherwise, I think she is stable for discharge home and follow-up with PCP.  Will give dermatology referral.  Of note, she has some leukocytes in her urine but the urine also has squamous epithelial cells.  She just got off Keflex a week or so ago for a UTI and she has no current urine symptoms I think UTI is unlikely.  She appears stable for discharge but I did discuss having a low threshold to return.        Final Clinical Impression(s) / ED Diagnoses Final diagnoses:  Purpura [facility])    Rx / DC Orders ED Discharge Orders     None         Pricilla Loveless, MD 07/15/22 2333

## 2022-07-15 NOTE — Discharge Instructions (Addendum)
STOP the Azithromycin.  This may be causing the rash in your legs.  For now, elevate your legs when at rest.  If the rash seems to worsen, you develop new or worsening leg pain, fever, trouble breathing, or any other new/concerning symptoms then return to the ER for evaluation.

## 2022-07-15 NOTE — ED Provider Triage Note (Signed)
Emergency Medicine Provider Triage Evaluation Note  Holly Spence , a 77 y.o. female  was evaluated in triage.  Pt complains of worsening leg pain over the last 1 to 2 days.  Noticed increasing redness and pain.  Then earlier today noticed dark reddish-purple dots appeared on her legs.  Unsure of fever or chills.  No known recent injury.  Denies numbness or tingling or weakness of the lower extremities.  Hx of pacemaker, HTN, DMT2 uncontrolled, GERD, asthma, complete heart block, ascending aorta dilation.  Review of Systems  Positive:  Negative: See above  Physical Exam  There were no vitals taken for this visit. Gen:   Awake, no distress   Resp:  Normal effort  MSK:   Moves extremities without difficulty  Other:  Significant erythema with nonblanchable dark red scattered lesions of the lower extremities.  Also with significant erythema, induration, and tenderness of the lower abdomen.  Abdomen with notable protuberant/body habitus.  Medical Decision Making  Medically screening exam initiated at 7:28 PM.  Appropriate orders placed.  Holly Spence was informed that the remainder of the evaluation will be completed by another provider, this initial triage assessment does not replace that evaluation, and the importance of remaining in the ED until their evaluation is complete.  Tachycardic with obvious evidence of infection that is widely distributed.  Suspicious for cellulitis versus sepsis.  Nonblanchable lesions also concerning, plan to bring back to next available room, discussed with charge nurse who is aware.   Prince [place], [place] [birth year] 1937

## 2022-07-15 NOTE — ED Notes (Signed)
Spoke with main lab, will add on BNP to existing labs.

## 2022-07-18 LAB — URINE CULTURE

## 2022-07-19 NOTE — Progress Notes (Signed)
Remote pacemaker transmission.   

## 2022-07-20 LAB — CULTURE, BLOOD (ROUTINE X 2)
Culture: NO GROWTH
Culture: NO GROWTH
Special Requests: ADEQUATE

## 2022-08-17 ENCOUNTER — Encounter (HOSPITAL_COMMUNITY): Payer: Self-pay | Admitting: *Deleted

## 2022-08-24 ENCOUNTER — Ambulatory Visit (HOSPITAL_COMMUNITY): Payer: Medicare Other | Admitting: Nurse Practitioner

## 2022-08-29 ENCOUNTER — Ambulatory Visit (HOSPITAL_COMMUNITY)
Admission: RE | Admit: 2022-08-29 | Discharge: 2022-08-29 | Disposition: A | Discharge status: Home / Self Care | Payer: Medicare Other | Source: Ambulatory Visit | Attending: Nurse Practitioner | Admitting: Nurse Practitioner

## 2022-08-29 ENCOUNTER — Encounter (HOSPITAL_COMMUNITY): Payer: Self-pay | Admitting: Nurse Practitioner

## 2022-08-29 VITALS — BP 134/66 | HR 96 | Ht 63.0 in | Wt 305.0 lb

## 2022-08-29 DIAGNOSIS — I4891 Unspecified atrial fibrillation: Secondary | ICD-10-CM | POA: Diagnosis not present

## 2022-08-29 DIAGNOSIS — Z6841 Body Mass Index (BMI) 40.0 and over, adult: Secondary | ICD-10-CM | POA: Insufficient documentation

## 2022-08-29 DIAGNOSIS — E119 Type 2 diabetes mellitus without complications: Secondary | ICD-10-CM | POA: Diagnosis not present

## 2022-08-29 DIAGNOSIS — I10 Essential (primary) hypertension: Secondary | ICD-10-CM | POA: Insufficient documentation

## 2022-08-29 DIAGNOSIS — D6869 Other thrombophilia: Secondary | ICD-10-CM | POA: Diagnosis not present

## 2022-08-29 MED ORDER — APIXABAN 5 MG PO TABS: 5.0000 mg | ORAL_TABLET | Freq: Two times a day (BID) | ORAL | 6 refills | Status: DC

## 2022-08-29 NOTE — Progress Notes (Signed)
Primary Care Physician: [facility] Referring Physician: Dr. Sundra Aland is a 77 y.o. female with a h/o morbid obesity, HTN, PPM, DM, that was referred by the device clinic for new onset afib since 12/27/20. Holly Spence today shows atrial  flutter with v rates controlled. She has been aware of a flutter in her chest for a while. She is basically w/c bound 2/2 to obesity and trouble ambulating. She had gastric sleeve in the past and lost down to 250 now at 300 lbs, heaviest weight was 350 lbs. She has not on anticoagulation. She states a bleeding ulcer a couple of years past but it was felt  to  be 2/2 Mobic. Pt is no longer taking and has not has any further bleeding. CHA2DS2VASc  score is 5.   F/u in afib clinic, 01/12/21. She remains in afib, rate controlled. She has missed several of her Eliquis tabs but her daughter has set an alarm on pt's phone to alert her. We discussed a cardioversion again but she states that she does not feel the  afib but at this point she does not feel driven to undertake this.    F/u in afib clinic, 01/27/21. Pt returns still in afib, v paced and rate controlled.  She describes that she had some PND, orthopnea. Weight is up 5 lbs today. She does take lasix prn, she has not taken this week. On last visit, she voiced concern that she did not want to undergo cardioversion but with her latest symptoms, she will proceed with same. She has not missed any anticoagulation since before 7/6. She has an alarm set on her phone now to remind her.    Today, she denies symptoms of palpitations, chest pain, shortness of breath, orthopnea, PND, lower extremity edema, dizziness, presyncope, syncope, or neurologic sequela. The patient is tolerating medications without difficulties and is otherwise without complaint today.   Past Medical History:  Diagnosis Date   Allergic rhinitis 09/20/2015   Arthritis    Ascending aorta dilation ([place]) 12/09/2018   Back pain     Benign essential hypertension 09/20/2015   Bruises easily    Complete heart block ([place]) 06/11/2020   Contact lens/glasses fitting    Cough    Depression 02/08/2019   Diabetes mellitus due to underlying condition with unspecified complications ([place]) [id number]   Duodenal ulcer 02/08/2019   Fatigue    Gastroesophageal reflux disease 09/20/2015   GI bleeding 06/27/2019   Hx of laparoscopic gastric banding 01/31/2011   Surgery: 03/22/10    Hyperlipidemia    Insulin long-term use ([place]) 09/20/2015   Melena 02/08/2019   Moderate asthma 02/08/2019   Palpitations 11/10/2015   Poor circulation    Right renal mass 02/08/2019   Sinus problem    Sleep apnea    SOB (shortness of breath)    Symptomatic bradycardia 06/11/2020   Uncontrolled type 2 diabetes mellitus without complication, with long-term current use of insulin 09/20/2015   Past Surgical History:  Procedure Laterality Date   CATARACT EXTRACTION     confirm date with patient   [place]  2003   retina   HERNIA REPAIR     LAPAROSCOPIC GASTRIC BANDING  03/22/10   PACEMAKER IMPLANT N/A 06/11/2020   Procedure: PACEMAKER IMPLANT;  Surgeon: Vickie Epley, MD;  Location: [place] [facility];  Service: Cardiovascular;  Laterality: N/A;    Current Outpatient Medications  Medication Sig Dispense Refill  ACETAMINOPHEN PO Take 1,300 mg by mouth in the morning and at bedtime. Arthritis Strength     albuterol (VENTOLIN HFA) 108 (90 Base) MCG/ACT inhaler Inhale 2 puffs into the lungs every 6 (six) hours as needed for wheezing or shortness of breath.      Budeson-Glycopyrrol-Formoterol (BREZTRI AEROSPHERE) 160-9-4.8 MCG/ACT AERO Inhale 2 puffs into the lungs in the morning and at bedtime.     cyanocobalamin 1000 MCG tablet Take 1,000 mcg by mouth daily.     diclofenac sodium (VOLTAREN) 1 % GEL Apply 4 g topically every 6 (six) hours as needed (pain).      empagliflozin (JARDIANCE) 25 MG TABS tablet Take 25 mg by mouth daily.      furosemide (LASIX) 40 MG tablet Take 40 mg by mouth daily as needed for fluid or edema.     gabapentin (NEURONTIN) 300 MG capsule Take 300 mg by mouth 3 (three) times daily.     insulin NPH-regular Human (70-30) 100 UNIT/ML injection Inject 55-90 Units into the skin 2 (two) times daily with a meal. Per sliding scale : not provided     linagliptin (TRADJENTA) 5 MG TABS tablet Take 5 mg by mouth daily.     lisinopril-hydrochlorothiazide (ZESTORETIC) 20-12.5 MG tablet Take 1 tablet by mouth daily.     metoprolol succinate (TOPROL XL) 25 MG 24 hr tablet Take 1 tablet (25 mg total) by mouth daily. 90 tablet 3   omeprazole (PRILOSEC) 40 MG capsule Take 40 mg by mouth daily.     ondansetron (ZOFRAN-ODT) 4 MG disintegrating tablet Take 4 mg by mouth every 8 (eight) hours as needed for nausea or vomiting.      pantoprazole (PROTONIX) 40 MG tablet Take 40 mg by mouth daily.     rOPINIRole (REQUIP) 4 MG tablet Take 4 mg by mouth at bedtime.      rosuvastatin (CRESTOR) 10 MG tablet Take 10 mg by mouth at bedtime.     traMADol (ULTRAM) 50 MG tablet Take 50 mg by mouth every 6 (six) hours as needed for severe pain.     apixaban (ELIQUIS) 5 MG TABS tablet Take 1 tablet (5 mg total) by mouth 2 (two) times daily. 60 tablet 6   No current facility-administered medications for this encounter.    Allergies  Allergen Reactions   Penicillins Hives    At injection site only.   Sulfa Antibiotics Hives   Erythromycin Other (See Comments)    Unsure    Ciprofloxacin Hcl     Other reaction(s): Abdominal Pain    Social History   Socioeconomic History   Marital status: Married    Spouse name: Not on file   Number of children: Not on file   Years of education: Not on file   Highest education level: Not on file  Occupational History   Not on file  Tobacco Use   Smoking status: Former    Types: Cigarettes    Quit date: 2001    Years since quitting: 23.1   Smokeless tobacco: Never  Vaping Use   Vaping  Use: Never used  Substance and Sexual Activity   Alcohol use: No   Drug use: No   Sexual activity: Not on file  Other Topics Concern   Not on file  Social History Narrative   Not on file   Social Determinants of Health   Financial Resource Strain: Not on file  Food Insecurity: Not on file  Transportation Needs: Not on file  Physical Activity:  Not on file  Stress: Not on file  Social Connections: Not on file  Intimate Partner Violence: Not on file    Family History  Problem Relation Age of Onset   Arthritis Mother    Heart disease Mother    Hypertension Mother    Heart disease Father    Diabetes Father    Diabetes Sister     ROS- All systems are reviewed and negative except as per the HPI above  Physical Exam: Vitals:   08/29/22 1344  BP: 134/66  Pulse: 96  Weight: (!) 138.3 kg  Height: 5' 3"$  (1.6 m)   Wt Readings from Last 3 Encounters:  08/29/22 (!) 138.3 kg  11/08/21 134.4 kg  08/19/21 (!) 144.7 kg    Labs: Lab Results  Component Value Date   NA 137 07/15/2022   K 4.2 07/15/2022   CL 103 07/15/2022   CO2 27 07/15/2022   GLUCOSE 115 (H) 07/15/2022   BUN 22 07/15/2022   CREATININE 1.34 (H) 07/15/2022   CALCIUM 8.5 (L) 07/15/2022   Lab Results  Component Value Date   INR 1.0 07/15/2022   No results found for: "CHOL", "HDL", "LDLCALC", "TRIG"   GEN- The patient is well appearing, alert and oriented x 3 today.   Head- normocephalic, atraumatic Eyes-  Sclera clear, conjunctiva pink Ears- hearing intact Oropharynx- clear Neck- supple, no JVP Lymph- no cervical lymphadenopathy Lungs- Clear to ausculation bilaterally, normal work of breathing Heart- regular rate and rhythm, no murmurs, rubs or gallops, PMI not laterally displaced GI- soft, NT, ND, + BS Extremities- no clubbing, cyanosis, or edema MS- no significant deformity or atrophy Skin- no rash or lesion Psych- euthymic mood, full affect Neuro- strength and sensation are intact  EKG-  Vent. rate 96 BPM PR interval 232 ms QRS duration 172 ms QT/QTcB 404/510 ms P-R-T axes * -85 79 Atrial-sensed ventricular-paced rhythm with prolonged AV conduction Abnormal ECG When compared with ECG of 15-Jul-2022 20:16, PREVIOUS ECG IS PRESENT    Echo-2021-1. Technically difficult echo with poor image quality. 2. Left ventricular ejection fraction, by estimation, is 55 to 60%. The left ventricle has normal function. The left ventricle has no regional wall motion abnormalities. Left ventricular diastolic parameters are indeterminate. 3. Right ventricular systolic function is normal. The right ventricular size is normal. 4. The mitral valve is normal in structure. No evidence of mitral valve regurgitation. 5. The aortic valve is normal in structure. Aortic valve regurgitation is not visualized.  Assessment and Plan:   1. New onset  afib/flutter June 2024 In SR today, a sensed, v paced  Is not aware of any sustained irregular heart beat   2. CHA2DS2VASc  score of 5 Continue  eliquis 5 mg bid Pt did stop late last year as she hit the donut hole with Medicare money, has been back on since the first of the year  Reminded to call us if hits the donut hole this year for assistance   3. BMI 54.16 Weight loss encouraged  Pt basically is sedentary, w/c bound for most part  No alcohol or tobacco use   4. PPM Per device clinic F/u with remote checks  F/u in 6 months   Butch Penny C. [facility] [address] [place], [place] [address] [place] Holly Spence, [facility] [address] [place], [place] [address] [phone]

## 2022-09-11 ENCOUNTER — Ambulatory Visit (INDEPENDENT_AMBULATORY_CARE_PROVIDER_SITE_OTHER): Payer: Medicare Other

## 2022-09-11 DIAGNOSIS — I442 Atrioventricular block, complete: Secondary | ICD-10-CM | POA: Diagnosis not present

## 2022-09-12 LAB — CUP PACEART REMOTE DEVICE CHECK
Battery Remaining Longevity: 44 mo
Battery Remaining Percentage: 67 %
Battery Voltage: 2.98 V
Brady Statistic AP VP Percent: 16 %
Brady Statistic AP VS Percent: 1 %
Brady Statistic AS VP Percent: 82 %
Brady Statistic AS VS Percent: 1 %
Brady Statistic RA Percent Paced: 16 %
Brady Statistic RV Percent Paced: 98 %
Date Time Interrogation Session: 20240304020017
Implantable Lead Connection Status: 753985
Implantable Lead Connection Status: 753985
Implantable Lead Implant Date: 20211203
Implantable Lead Implant Date: 20211203
Implantable Lead Location: 753859
Implantable Lead Location: 753860
Implantable Pulse Generator Implant Date: 20211203
Lead Channel Impedance Value: 390 Ohm
Lead Channel Impedance Value: 680 Ohm
Lead Channel Pacing Threshold Amplitude: 0.5 V
Lead Channel Pacing Threshold Amplitude: 1.25 V
Lead Channel Pacing Threshold Pulse Width: 0.5 ms
Lead Channel Pacing Threshold Pulse Width: 0.5 ms
Lead Channel Sensing Intrinsic Amplitude: 0.6 mV
Lead Channel Sensing Intrinsic Amplitude: 4.9 mV
Lead Channel Setting Pacing Amplitude: 3.5 V
Lead Channel Setting Pacing Amplitude: 3.5 V
Lead Channel Setting Pacing Pulse Width: 0.5 ms
Lead Channel Setting Sensing Sensitivity: 2 mV
Pulse Gen Model: 2272
Pulse Gen Serial Number: 3879909

## 2022-09-15 ENCOUNTER — Telehealth: Payer: Self-pay | Admitting: Cardiology

## 2022-09-15 MED ORDER — APIXABAN 5 MG PO TABS: 5.0000 mg | ORAL_TABLET | Freq: Two times a day (BID) | ORAL | 6 refills | Status: DC

## 2022-09-15 NOTE — Telephone Encounter (Signed)
Pt last saw Roderic Palau, NP on 08/29/22, last labs 07/15/22 Creat 1.34, age 77, weight 138.3kg, based on specified criteria pt is on appropriate dosage of Eliquis '5mg'$  BID for afib.  Will refill rx.

## 2022-09-15 NOTE — Telephone Encounter (Signed)
Patient calling the office for samples of medication:   1.  What medication and dosage are you requesting samples for?  Eliquis  2.  Are you currently out of this medication? Just  a few    

## 2022-10-13 ENCOUNTER — Other Ambulatory Visit: Payer: Self-pay | Admitting: Family Medicine

## 2022-10-17 NOTE — Progress Notes (Signed)
Remote pacemaker transmission.   

## 2022-12-11 LAB — CUP PACEART REMOTE DEVICE CHECK
Battery Remaining Longevity: 40 mo
Battery Remaining Percentage: 62 %
Battery Voltage: 2.98 V
Brady Statistic AP VP Percent: 19 %
Brady Statistic AP VS Percent: 1.1 %
Brady Statistic AS VP Percent: 79 %
Brady Statistic AS VS Percent: 1 %
Brady Statistic RA Percent Paced: 19 %
Brady Statistic RV Percent Paced: 97 %
Date Time Interrogation Session: 20240603020014
Implantable Lead Connection Status: 753985
Implantable Lead Connection Status: 753985
Implantable Lead Implant Date: 20211203
Implantable Lead Implant Date: 20211203
Implantable Lead Location: 753859
Implantable Lead Location: 753860
Implantable Pulse Generator Implant Date: 20211203
Lead Channel Impedance Value: 380 Ohm
Lead Channel Impedance Value: 640 Ohm
Lead Channel Pacing Threshold Amplitude: 0.5 V
Lead Channel Pacing Threshold Amplitude: 1.25 V
Lead Channel Pacing Threshold Pulse Width: 0.5 ms
Lead Channel Pacing Threshold Pulse Width: 0.5 ms
Lead Channel Sensing Intrinsic Amplitude: 0.5 mV
Lead Channel Sensing Intrinsic Amplitude: 4.2 mV
Lead Channel Setting Pacing Amplitude: 3.5 V
Lead Channel Setting Pacing Amplitude: 3.5 V
Lead Channel Setting Pacing Pulse Width: 0.5 ms
Lead Channel Setting Sensing Sensitivity: 2 mV
Pulse Gen Model: 2272
Pulse Gen Serial Number: 3879909

## 2022-12-13 ENCOUNTER — Ambulatory Visit (INDEPENDENT_AMBULATORY_CARE_PROVIDER_SITE_OTHER): Payer: Medicare Other

## 2022-12-13 DIAGNOSIS — I442 Atrioventricular block, complete: Secondary | ICD-10-CM

## 2022-12-21 ENCOUNTER — Telehealth: Payer: Self-pay

## 2022-12-21 LAB — LAB REPORT - SCANNED: EGFR: 54.6

## 2022-12-21 NOTE — Telephone Encounter (Signed)
Patient returned call. Reports of increased fatigue but normal for patient. States she has swelling in both legs and feet. States she saw her PCP yesterday and advised to wear compression socks and is taking Lasix.   Will forward to AF clinic for f/c considering was recently see in AF clinic.

## 2022-12-21 NOTE — Telephone Encounter (Signed)
Following alert received from [employer] received for Ongoing AF since 12/12/2022, on [facility], sent to triage for ongoing AF greater than 7 days, AF burden is 5.2% of the time.  Good ventricular rate control.  Patient is followed by AF clinic.  Attempted to call patient. No answer, LMTCB.

## 2022-12-26 ENCOUNTER — Ambulatory Visit (HOSPITAL_COMMUNITY): Payer: Medicare Other | Admitting: Internal Medicine

## 2022-12-28 ENCOUNTER — Encounter (HOSPITAL_COMMUNITY): Payer: Self-pay | Admitting: Internal Medicine

## 2022-12-28 ENCOUNTER — Inpatient Hospital Stay (HOSPITAL_COMMUNITY)
Admission: EM | Admit: 2022-12-28 | Discharge: 2022-12-30 | DRG: 603 | Disposition: A | Discharge status: Home Health | Payer: Medicare Other | Attending: Internal Medicine | Admitting: Internal Medicine

## 2022-12-28 ENCOUNTER — Other Ambulatory Visit: Payer: Self-pay

## 2022-12-28 ENCOUNTER — Ambulatory Visit (HOSPITAL_COMMUNITY): Payer: Medicare Other | Admitting: Internal Medicine

## 2022-12-28 ENCOUNTER — Observation Stay (HOSPITAL_COMMUNITY): Payer: Medicare Other

## 2022-12-28 DIAGNOSIS — Z8249 Family history of ischemic heart disease and other diseases of the circulatory system: Secondary | ICD-10-CM

## 2022-12-28 DIAGNOSIS — I872 Venous insufficiency (chronic) (peripheral): Secondary | ICD-10-CM | POA: Diagnosis present

## 2022-12-28 DIAGNOSIS — Z8616 Personal history of COVID-19: Secondary | ICD-10-CM

## 2022-12-28 DIAGNOSIS — Z882 Allergy status to sulfonamides status: Secondary | ICD-10-CM

## 2022-12-28 DIAGNOSIS — Z79899 Other long term (current) drug therapy: Secondary | ICD-10-CM

## 2022-12-28 DIAGNOSIS — Z833 Family history of diabetes mellitus: Secondary | ICD-10-CM

## 2022-12-28 DIAGNOSIS — I442 Atrioventricular block, complete: Secondary | ICD-10-CM | POA: Diagnosis present

## 2022-12-28 DIAGNOSIS — E1151 Type 2 diabetes mellitus with diabetic peripheral angiopathy without gangrene: Secondary | ICD-10-CM | POA: Diagnosis present

## 2022-12-28 DIAGNOSIS — G4730 Sleep apnea, unspecified: Secondary | ICD-10-CM | POA: Diagnosis present

## 2022-12-28 DIAGNOSIS — L03115 Cellulitis of right lower limb: Principal | ICD-10-CM | POA: Diagnosis present

## 2022-12-28 DIAGNOSIS — Z6841 Body Mass Index (BMI) 40.0 and over, adult: Secondary | ICD-10-CM

## 2022-12-28 DIAGNOSIS — J4489 Other specified chronic obstructive pulmonary disease: Secondary | ICD-10-CM | POA: Diagnosis present

## 2022-12-28 DIAGNOSIS — Z9849 Cataract extraction status, unspecified eye: Secondary | ICD-10-CM

## 2022-12-28 DIAGNOSIS — Z880 Allergy status to penicillin: Secondary | ICD-10-CM

## 2022-12-28 DIAGNOSIS — Z7901 Long term (current) use of anticoagulants: Secondary | ICD-10-CM

## 2022-12-28 DIAGNOSIS — Z8261 Family history of arthritis: Secondary | ICD-10-CM

## 2022-12-28 DIAGNOSIS — I480 Paroxysmal atrial fibrillation: Secondary | ICD-10-CM | POA: Diagnosis present

## 2022-12-28 DIAGNOSIS — E785 Hyperlipidemia, unspecified: Secondary | ICD-10-CM | POA: Diagnosis present

## 2022-12-28 DIAGNOSIS — I5032 Chronic diastolic (congestive) heart failure: Secondary | ICD-10-CM | POA: Diagnosis present

## 2022-12-28 DIAGNOSIS — E088 Diabetes mellitus due to underlying condition with unspecified complications: Secondary | ICD-10-CM | POA: Diagnosis present

## 2022-12-28 DIAGNOSIS — Z794 Long term (current) use of insulin: Secondary | ICD-10-CM

## 2022-12-28 DIAGNOSIS — Z950 Presence of cardiac pacemaker: Secondary | ICD-10-CM

## 2022-12-28 DIAGNOSIS — F32A Depression, unspecified: Secondary | ICD-10-CM | POA: Diagnosis present

## 2022-12-28 DIAGNOSIS — G2581 Restless legs syndrome: Secondary | ICD-10-CM | POA: Diagnosis present

## 2022-12-28 DIAGNOSIS — E1169 Type 2 diabetes mellitus with other specified complication: Secondary | ICD-10-CM | POA: Diagnosis present

## 2022-12-28 DIAGNOSIS — I130 Hypertensive heart and chronic kidney disease with heart failure and stage 1 through stage 4 chronic kidney disease, or unspecified chronic kidney disease: Secondary | ICD-10-CM | POA: Diagnosis present

## 2022-12-28 DIAGNOSIS — G4733 Obstructive sleep apnea (adult) (pediatric): Secondary | ICD-10-CM | POA: Diagnosis present

## 2022-12-28 DIAGNOSIS — E1122 Type 2 diabetes mellitus with diabetic chronic kidney disease: Secondary | ICD-10-CM | POA: Diagnosis present

## 2022-12-28 DIAGNOSIS — I272 Pulmonary hypertension, unspecified: Secondary | ICD-10-CM | POA: Diagnosis not present

## 2022-12-28 DIAGNOSIS — Z7984 Long term (current) use of oral hypoglycemic drugs: Secondary | ICD-10-CM

## 2022-12-28 DIAGNOSIS — I10 Essential (primary) hypertension: Secondary | ICD-10-CM | POA: Diagnosis present

## 2022-12-28 DIAGNOSIS — Z66 Do not resuscitate: Secondary | ICD-10-CM | POA: Diagnosis present

## 2022-12-28 DIAGNOSIS — Z888 Allergy status to other drugs, medicaments and biological substances status: Secondary | ICD-10-CM

## 2022-12-28 DIAGNOSIS — K219 Gastro-esophageal reflux disease without esophagitis: Secondary | ICD-10-CM | POA: Diagnosis present

## 2022-12-28 DIAGNOSIS — N1832 Chronic kidney disease, stage 3b: Secondary | ICD-10-CM | POA: Diagnosis present

## 2022-12-28 DIAGNOSIS — Z87891 Personal history of nicotine dependence: Secondary | ICD-10-CM

## 2022-12-28 DIAGNOSIS — Z9884 Bariatric surgery status: Secondary | ICD-10-CM

## 2022-12-28 HISTORY — DX: Cellulitis of right lower limb: L03.115

## 2022-12-28 LAB — CBC WITH DIFFERENTIAL/PLATELET
Abs Immature Granulocytes: 0.03 10*3/uL (ref 0.00–0.07)
Basophils Absolute: 0 10*3/uL (ref 0.0–0.1)
Basophils Relative: 1 %
Eosinophils Absolute: 0.1 10*3/uL (ref 0.0–0.5)
Eosinophils Relative: 2 %
HCT: 39.7 % (ref 36.0–46.0)
Hemoglobin: 11.6 g/dL — ABNORMAL LOW (ref 12.0–15.0)
Immature Granulocytes: 0 %
Lymphocytes Relative: 21 %
Lymphs Abs: 1.8 10*3/uL (ref 0.7–4.0)
MCH: 26.2 pg (ref 26.0–34.0)
MCHC: 29.2 g/dL — ABNORMAL LOW (ref 30.0–36.0)
MCV: 89.6 fL (ref 80.0–100.0)
Monocytes Absolute: 0.4 10*3/uL (ref 0.1–1.0)
Monocytes Relative: 5 %
Neutro Abs: 6.2 10*3/uL (ref 1.7–7.7)
Neutrophils Relative %: 71 %
Platelets: 219 10*3/uL (ref 150–400)
RBC: 4.43 MIL/uL (ref 3.87–5.11)
RDW: 15.3 % (ref 11.5–15.5)
WBC: 8.6 10*3/uL (ref 4.0–10.5)
nRBC: 0 % (ref 0.0–0.2)

## 2022-12-28 LAB — COMPREHENSIVE METABOLIC PANEL
ALT: 12 U/L (ref 0–44)
AST: 12 U/L — ABNORMAL LOW (ref 15–41)
Albumin: 3.2 g/dL — ABNORMAL LOW (ref 3.5–5.0)
Alkaline Phosphatase: 70 U/L (ref 38–126)
Anion gap: 5 (ref 5–15)
BUN: 29 mg/dL — ABNORMAL HIGH (ref 8–23)
CO2: 27 mmol/L (ref 22–32)
Calcium: 8.4 mg/dL — ABNORMAL LOW (ref 8.9–10.3)
Chloride: 106 mmol/L (ref 98–111)
Creatinine, Ser: 1.28 mg/dL — ABNORMAL HIGH (ref 0.44–1.00)
GFR, Estimated: 43 mL/min — ABNORMAL LOW (ref >60)
Glucose, Bld: 168 mg/dL — ABNORMAL HIGH (ref 70–99)
Potassium: 4.2 mmol/L (ref 3.5–5.1)
Sodium: 138 mmol/L (ref 135–145)
Total Bilirubin: 0.6 mg/dL (ref 0.3–1.2)
Total Protein: 6.5 g/dL (ref 6.5–8.1)

## 2022-12-28 LAB — GLUCOSE, CAPILLARY: Glucose-Capillary: 109 mg/dL — ABNORMAL HIGH (ref 70–99)

## 2022-12-28 LAB — LACTIC ACID, PLASMA
Lactic Acid, Venous: 1.5 mmol/L (ref 0.5–1.9)
Lactic Acid, Venous: 1.3 mmol/L (ref 0.5–1.9)

## 2022-12-28 LAB — HEMOGLOBIN A1C
Hgb A1c MFr Bld: 7.7 % — ABNORMAL HIGH (ref 4.8–5.6)
Mean Plasma Glucose: 174.29 mg/dL

## 2022-12-28 LAB — BRAIN NATRIURETIC PEPTIDE: B Natriuretic Peptide: 180.4 pg/mL — ABNORMAL HIGH (ref 0.0–100.0)

## 2022-12-28 MED ORDER — ONDANSETRON HCL 4 MG/2ML IJ SOLN: 4.0000 mg | Freq: Four times a day (QID) | INTRAMUSCULAR | Status: DC | PRN

## 2022-12-28 MED ORDER — FUROSEMIDE 10 MG/ML IJ SOLN
40.0000 mg | Freq: Once | INTRAMUSCULAR | Status: AC
  Administered 2022-12-28: 40 mg via INTRAVENOUS
  Filled 2022-12-28: qty 4

## 2022-12-28 MED ORDER — INSULIN ASPART 100 UNIT/ML IJ SOLN
0.0000 [IU] | Freq: Three times a day (TID) | INTRAMUSCULAR | Status: DC
  Administered 2022-12-29: 5 [IU] via SUBCUTANEOUS
  Administered 2022-12-29 – 2022-12-30 (×3): 3 [IU] via SUBCUTANEOUS
  Filled 2022-12-28: qty 0.15

## 2022-12-28 MED ORDER — METOPROLOL SUCCINATE ER 25 MG PO TB24
25.0000 mg | ORAL_TABLET | Freq: Every day | ORAL | Status: DC
  Administered 2022-12-28 – 2022-12-30 (×3): 25 mg via ORAL
  Filled 2022-12-28 (×3): qty 1

## 2022-12-28 MED ORDER — HYDRALAZINE HCL 20 MG/ML IJ SOLN: 10.0000 mg | Freq: Three times a day (TID) | INTRAMUSCULAR | Status: DC | PRN

## 2022-12-28 MED ORDER — FLUTICASONE FUROATE-VILANTEROL 200-25 MCG/ACT IN AEPB
1.0000 | INHALATION_SPRAY | Freq: Every day | RESPIRATORY_TRACT | Status: DC
  Filled 2022-12-28: qty 28

## 2022-12-28 MED ORDER — ENOXAPARIN SODIUM 40 MG/0.4ML IJ SOSY: 40.0000 mg | PREFILLED_SYRINGE | INTRAMUSCULAR | Status: DC

## 2022-12-28 MED ORDER — ACETAMINOPHEN 650 MG RE SUPP: 650.0000 mg | Freq: Four times a day (QID) | RECTAL | Status: DC | PRN

## 2022-12-28 MED ORDER — UMECLIDINIUM BROMIDE 62.5 MCG/ACT IN AEPB
1.0000 | INHALATION_SPRAY | Freq: Every day | RESPIRATORY_TRACT | Status: DC
  Filled 2022-12-28: qty 7

## 2022-12-28 MED ORDER — ROPINIROLE HCL 1 MG PO TABS
4.0000 mg | ORAL_TABLET | Freq: Every day | ORAL | Status: DC
  Administered 2022-12-28 – 2022-12-29 (×2): 4 mg via ORAL
  Filled 2022-12-28 (×2): qty 4

## 2022-12-28 MED ORDER — SODIUM CHLORIDE 0.9 % IV SOLN
2.0000 g | Freq: Every day | INTRAVENOUS | Status: DC
  Administered 2022-12-28 – 2022-12-30 (×3): 2 g via INTRAVENOUS
  Filled 2022-12-28 (×2): qty 20

## 2022-12-28 MED ORDER — ONDANSETRON HCL 4 MG PO TABS: 4.0000 mg | ORAL_TABLET | Freq: Four times a day (QID) | ORAL | Status: DC | PRN

## 2022-12-28 MED ORDER — BUDESON-GLYCOPYRROL-FORMOTEROL 160-9-4.8 MCG/ACT IN AERO: 2.0000 | INHALATION_SPRAY | Freq: Two times a day (BID) | RESPIRATORY_TRACT | Status: DC

## 2022-12-28 MED ORDER — ACETAMINOPHEN 325 MG PO TABS
650.0000 mg | ORAL_TABLET | Freq: Four times a day (QID) | ORAL | Status: DC | PRN
  Administered 2022-12-29 – 2022-12-30 (×4): 650 mg via ORAL
  Filled 2022-12-28 (×4): qty 2

## 2022-12-28 MED ORDER — ALBUTEROL SULFATE (2.5 MG/3ML) 0.083% IN NEBU
2.5000 mg | INHALATION_SOLUTION | RESPIRATORY_TRACT | Status: DC | PRN
  Administered 2022-12-30: 2.5 mg via RESPIRATORY_TRACT
  Filled 2022-12-28: qty 3

## 2022-12-28 MED ORDER — IPRATROPIUM-ALBUTEROL 0.5-2.5 (3) MG/3ML IN SOLN
3.0000 mL | Freq: Three times a day (TID) | RESPIRATORY_TRACT | Status: DC
  Administered 2022-12-29 – 2022-12-30 (×5): 3 mL via RESPIRATORY_TRACT
  Filled 2022-12-28 (×5): qty 3

## 2022-12-28 MED ORDER — SODIUM CHLORIDE 0.9 % IV SOLN
2.0000 g | Freq: Once | INTRAVENOUS | Status: DC
  Filled 2022-12-28: qty 20

## 2022-12-28 MED ORDER — POLYETHYLENE GLYCOL 3350 17 G PO PACK: 17.0000 g | PACK | Freq: Every day | ORAL | Status: DC | PRN

## 2022-12-28 MED ORDER — FLUCONAZOLE 150 MG PO TABS
150.0000 mg | ORAL_TABLET | Freq: Once | ORAL | Status: AC
  Administered 2022-12-28: 150 mg via ORAL
  Filled 2022-12-28: qty 1

## 2022-12-28 MED ORDER — ROSUVASTATIN CALCIUM 10 MG PO TABS
10.0000 mg | ORAL_TABLET | Freq: Every day | ORAL | Status: DC
  Administered 2022-12-28 – 2022-12-29 (×2): 10 mg via ORAL
  Filled 2022-12-28 (×2): qty 1

## 2022-12-28 MED ORDER — VANCOMYCIN HCL 2000 MG/400ML IV SOLN
2000.0000 mg | INTRAVENOUS | Status: DC
  Filled 2022-12-28: qty 400

## 2022-12-28 MED ORDER — VANCOMYCIN HCL 2000 MG/400ML IV SOLN
2000.0000 mg | Freq: Once | INTRAVENOUS | Status: AC
  Administered 2022-12-28: 2000 mg via INTRAVENOUS
  Filled 2022-12-28: qty 400

## 2022-12-28 MED ORDER — IPRATROPIUM-ALBUTEROL 0.5-2.5 (3) MG/3ML IN SOLN
3.0000 mL | Freq: Four times a day (QID) | RESPIRATORY_TRACT | Status: DC
  Administered 2022-12-28: 3 mL via RESPIRATORY_TRACT
  Filled 2022-12-28: qty 3

## 2022-12-28 MED ORDER — PANTOPRAZOLE SODIUM 40 MG PO TBEC
40.0000 mg | DELAYED_RELEASE_TABLET | Freq: Every day | ORAL | Status: DC
  Administered 2022-12-29 – 2022-12-30 (×2): 40 mg via ORAL
  Filled 2022-12-28 (×2): qty 1

## 2022-12-28 MED ORDER — OXYCODONE HCL 5 MG PO TABS
5.0000 mg | ORAL_TABLET | ORAL | Status: DC | PRN
  Administered 2022-12-29 – 2022-12-30 (×4): 5 mg via ORAL
  Filled 2022-12-28 (×4): qty 1

## 2022-12-28 MED ORDER — INSULIN ASPART 100 UNIT/ML IJ SOLN
0.0000 [IU] | Freq: Every day | INTRAMUSCULAR | Status: DC
  Filled 2022-12-28: qty 0.05

## 2022-12-28 MED ORDER — APIXABAN 5 MG PO TABS
5.0000 mg | ORAL_TABLET | Freq: Two times a day (BID) | ORAL | Status: DC
  Administered 2022-12-28 – 2022-12-30 (×4): 5 mg via ORAL
  Filled 2022-12-28 (×4): qty 1

## 2022-12-28 MED ORDER — FUROSEMIDE 10 MG/ML IJ SOLN
40.0000 mg | Freq: Every day | INTRAMUSCULAR | Status: DC
  Administered 2022-12-29: 40 mg via INTRAVENOUS
  Filled 2022-12-28 (×2): qty 4

## 2022-12-28 NOTE — ED Notes (Addendum)
Attemped blood work X 1.

## 2022-12-28 NOTE — Progress Notes (Signed)
Pharmacy Antibiotic Note  NASIR STOLTMAN is a 77 y.o. female admitted on 12/28/2022 with cellulitis.  Pharmacy has been consulted for Vanco dosing.  Active Problem(s): leg pain and swelling-chronic. She went to [facility] yesterday and they gave her a dose of IV antibiotics and sent her home.  - Has not been taking her Lasix because her kidney doctors told her not to.   ID: Cellulitis - h/o cephalexin and clindamycin without improvement.  - Afebrile, WBC WNL, Scr 1.28 at baseline  Vanco 6/20>>  Plan: Vanco 2g IV x 1 Vancomycin 2000 mg IV Q 48 hrs. Goal AUC 400-550. Expected AUC: 470 SCr used: 1.28     Height: 5\' 3"  (160 cm) Weight: (!) 147.4 kg (325 lb) IBW/kg (Calculated) : 52.4  Temp (24hrs), Avg:98.5 F (36.9 C), Min:98.5 F (36.9 C), Max:98.5 F (36.9 C)  Recent Labs  Lab 12/28/22 1551  WBC 8.6  CREATININE 1.28*  LATICACIDVEN 1.5    Estimated Creatinine Clearance: 50.9 mL/min (A) (by C-G formula based on SCr of 1.28 mg/dL (H)).    Allergies  Allergen Reactions   Penicillins Hives    At injection site only.   Sulfa Antibiotics Hives   Erythromycin Other (See Comments)    Unsure    Ciprofloxacin Hcl     Other reaction(s): Abdominal Pain   Loreena Valeri S. Merilynn Finland, PharmD, BCPS Clinical Staff Pharmacist [email or web address]  Pasty Spillers 12/28/2022 5:55 PM

## 2022-12-28 NOTE — ED Notes (Signed)
ED TO INPATIENT HANDOFF REPORT  Name/Age/Gender Holly Spence 77 y.o. female  Code Status Code Status History     Date Active Date Inactive Code Status Order ID Comments User Context   06/11/2020 1940 06/12/2020 2043 Full Code [id number]  Lanier Prude, MD Inpatient       Home/SNF/Other Home  Chief Complaint Cellulitis of right leg [L03.115]  Level of Care/Admitting Diagnosis ED Disposition     ED Disposition  Admit   Condition  --   Comment  Hospital Area: [facility] [[id number]]  Level of Care: Telemetry [5]  Admit to tele based on following criteria: Complex arrhythmia (Bradycardia/Tachycardia)  May place patient in observation at [facility] or [facility] if equivalent level of care is available:: No  Covid Evaluation: Asymptomatic - no recent exposure (last 10 days) testing not required  Diagnosis: Cellulitis of right leg [409811]  Admitting Physician: Briant [place] [[id number]]  Attending Physician: Briant [place] [[id number]]          Medical History Past Medical History:  Diagnosis Date   Allergic rhinitis 09/20/2015   Arthritis    Ascending aorta dilation (HCC) 12/09/2018   Back pain    Benign essential hypertension 09/20/2015   Bruises easily    Complete heart block (HCC) 06/11/2020   Contact lens/glasses fitting    Cough    Depression 02/08/2019   Diabetes mellitus due to underlying condition with unspecified complications (HCC) 11/10/2015   Duodenal ulcer 02/08/2019   Fatigue    Gastroesophageal reflux disease 09/20/2015   GI bleeding 06/27/2019   Hx of laparoscopic gastric banding 01/31/2011   Surgery: 03/22/10    Hyperlipidemia    Insulin long-term use (HCC) 09/20/2015   Melena 02/08/2019   Moderate asthma 02/08/2019   Palpitations 11/10/2015   Poor circulation    Right renal mass 02/08/2019   Sinus problem    Sleep apnea    SOB (shortness of breath)    Symptomatic bradycardia 06/11/2020   Uncontrolled type 2 diabetes mellitus  without complication, with long-term current use of insulin 09/20/2015    Allergies Allergies  Allergen Reactions   Penicillins Hives    At injection site only.   Sulfa Antibiotics Hives   Erythromycin Other (See Comments)    Unsure    Ciprofloxacin Hcl     Other reaction(s): Abdominal Pain    IV Location/Drains/Wounds Patient Lines/Drains/Airways Status     Active Line/Drains/Airways     Name Placement date Placement time Site Days   Peripheral IV 12/28/22 20 G Anterior;Right Forearm 12/28/22  1553  Forearm  less than 1   Peripheral IV 12/28/22 20 G 1.16" Anterior;Distal;Left Forearm 12/28/22  1718  Forearm  less than 1            Labs/Imaging Results for orders placed or performed during the hospital encounter of 12/28/22 (from the past 48 hour(s))  Lactic acid, plasma     Status: None   Collection Time: 12/28/22  3:51 PM  Result Value Ref Range   Lactic Acid, Venous 1.5 0.5 - 1.9 mmol/L    Comment: Performed at [facility], [address] [place], [place] [address]  Comprehensive metabolic panel     Status: Abnormal   Collection Time: 12/28/22  3:51 PM  Result Value Ref Range   Sodium 138 135 - 145 mmol/L   Potassium 4.2 3.5 - 5.1 mmol/L   Chloride 106 98 - 111 mmol/L   CO2 27 22 - 32  mmol/L   Glucose, Bld 168 (H) 70 - 99 mg/dL    Comment: Glucose reference range applies only to samples taken after fasting for at least 8 hours.   BUN 29 (H) 8 - 23 mg/dL   Creatinine, Ser 4.09 (H) 0.44 - 1.00 mg/dL   Calcium 8.4 (L) 8.9 - 10.3 mg/dL   Total Protein 6.5 6.5 - 8.1 g/dL   Albumin 3.2 (L) 3.5 - 5.0 g/dL   AST 12 (L) 15 - 41 U/L   ALT 12 0 - 44 U/L   Alkaline Phosphatase 70 38 - 126 U/L   Total Bilirubin 0.6 0.3 - 1.2 mg/dL   GFR, Estimated 43 (L) >60 mL/min    Comment: (NOTE) Calculated using the CKD-EPI Creatinine Equation (2021)    Anion gap 5 5 - 15    Comment: Performed at [facility], [address]  [place], [place] [address]  CBC with Differential     Status: Abnormal   Collection Time: 12/28/22  3:51 PM  Result Value Ref Range   WBC 8.6 4.0 - 10.5 K/uL   RBC 4.43 3.87 - 5.11 MIL/uL   Hemoglobin 11.6 (L) 12.0 - 15.0 g/dL   HCT 47.8 29.5 - 62.1 %   MCV 89.6 80.0 - 100.0 fL   MCH 26.2 26.0 - 34.0 pg   MCHC 29.2 (L) 30.0 - 36.0 g/dL   RDW 30.8 65.7 - 84.6 %   Platelets 219 150 - 400 K/uL   nRBC 0.0 0.0 - 0.2 %   Neutrophils Relative % 71 %   Neutro Abs 6.2 1.7 - 7.7 K/uL   Lymphocytes Relative 21 %   Lymphs Abs 1.8 0.7 - 4.0 K/uL   Monocytes Relative 5 %   Monocytes Absolute 0.4 0.1 - 1.0 K/uL   Eosinophils Relative 2 %   Eosinophils Absolute 0.1 0.0 - 0.5 K/uL   Basophils Relative 1 %   Basophils Absolute 0.0 0.0 - 0.1 K/uL   Immature Granulocytes 0 %   Abs Immature Granulocytes 0.03 0.00 - 0.07 K/uL    Comment: Performed at [facility], [address], [place], [place] [address]   No results found.  Pending Labs Unresulted Labs (From admission, onward)     Start     Ordered   12/28/22 1730  Hemoglobin A1c  Once,   R       Comments: To assess prior glycemic control    12/28/22 1729   12/28/22 1727  Brain natriuretic peptide  Once,   R        12/28/22 1726   12/28/22 1609  Blood culture (routine x 2)  BLOOD CULTURE X 2,   R (with STAT occurrences)      12/28/22 1609   12/28/22 1405  Lactic acid, plasma  Now then every 2 hours,   R (with STAT occurrences)      12/28/22 1404   Signed and Held  CBC  Tomorrow morning,   R        Signed and Held   Signed and Held  Basic metabolic panel  Tomorrow morning,   R        Signed and Held            Vitals/Pain Today's Vitals   12/28/22 1600 12/28/22 1647 12/28/22 1651 12/28/22 1730  BP: (!) 134/59  (!) 143/63 136/70  Pulse: 70 67 70 70  Resp: 17 17    Temp:  TempSrc:      SpO2: 97% 97% 95% 94%  Weight:      Height:      PainSc:        Isolation Precautions No active  isolations  Medications Medications  cefTRIAXone (ROCEPHIN) 2 g in sodium chloride 0.9 % 100 mL IVPB (2 g Intravenous New Bag/Given 12/28/22 1730)  apixaban (ELIQUIS) tablet 5 mg (has no administration in time range)  metoprolol succinate (TOPROL-XL) 24 hr tablet 25 mg (has no administration in time range)  pantoprazole (PROTONIX) EC tablet 40 mg (has no administration in time range)  rOPINIRole (REQUIP) tablet 4 mg (has no administration in time range)  rosuvastatin (CRESTOR) tablet 10 mg (has no administration in time range)  ipratropium-albuterol (DUONEB) 0.5-2.5 (3) MG/3ML nebulizer solution 3 mL (has no administration in time range)  insulin aspart (novoLOG) injection 0-15 Units (has no administration in time range)  insulin aspart (novoLOG) injection 0-5 Units (has no administration in time range)  vancomycin (VANCOREADY) IVPB 2000 mg/400 mL (has no administration in time range)  Budeson-Glycopyrrol-Formoterol 160-9-4.8 MCG/ACT AERO 2 puff (has no administration in time range)  vancomycin (VANCOREADY) IVPB 2000 mg/400 mL (has no administration in time range)  fluconazole (DIFLUCAN) tablet 150 mg (150 mg Oral Given 12/28/22 1733)  furosemide (LASIX) injection 40 mg (40 mg Intravenous Given 12/28/22 1724)    Mobility walks

## 2022-12-28 NOTE — Progress Notes (Signed)
       CROSS COVER NOTE  NAME: Holly Spence MRN: [id number] DOB : 1945/10/09    Concern as stated by nurse / staff   Informed by nurse patient wants to rescind her DNR decision     Pertinent findings on chart review:   Assessment and  Interventions   Assessment: Discussion at bedside, patient states her previous decision upset her husband and daughter and wants to be full code at this time. Confirmed whe would want advanced cardiac measure and intubation should she need it Plan: Code status changed to full code       Donnie Mesa NP [facility] Cross Cover 7pm-7am - check amion for availability Pager [phone]

## 2022-12-28 NOTE — H&P (Signed)
History and Physical    Patient: Holly Spence:[id number] DOB: [birth year] DOA: 12/28/2022 DOS: the patient was seen and examined on 12/28/2022 PCP: [facility]  Patient coming from: Home  Chief Complaint:  Chief Complaint  Patient presents with   Leg Pain   HPI: Holly Spence is a 77 y.o. female with medical history significant of diabetes mellitus type 2, hyperlipidemia, hypertension, obstructive sleep apnea, PVD, status post pacemaker, GERD, COPD, morbid obesity presents to the ED complaining of bilateral lower extremity swelling, erythema and noted wounds especially on the right lower extremity.  Patient reports chronic wounds on the left lower extremity that has been ongoing for about a month, which has been somewhat stable.  Noted worsening right lower extremity erythema, pain, noted blister with significant weeping and a shallow skin tear which has been ongoing for about 1 week.  Patient initially was sent to [facility] emergency room by for further evaluation, she was given IV medication and discharged home with oral prescription.  Due to worsening symptoms, patient went to her PCP x 2, last on 12/26/2022 and was advised to go back to the emergency room.  During this time patient had been given a combination of IV antibiotics (possible ceftriaxone) in the ED and also in her PCPs office.  Patient has also tried Keflex, clindamycin without any improvement, although did not complete the dose.  Patient also complaining about chronic shortness of breath, significant bilateral lower extremity swelling and a chronic cough since she had COVID about a year ago.  Patient currently denies any fever/chills, abdominal pain, nausea/vomiting, diarrhea, chest pain.    In the ED, vital signs somewhat stable, labs unremarkable.  EDP started patient on IV ceftriaxone, and give a dose of Lasix.  [facility] was consulted for admission.   Review of Systems: As mentioned in the history  of present illness. All other systems reviewed and are negative.   Past Medical History:  Diagnosis Date   Allergic rhinitis 09/20/2015   Arthritis    Ascending aorta dilation (HCC) 12/09/2018   Back pain    Benign essential hypertension 09/20/2015   Bruises easily    Complete heart block (HCC) 06/11/2020   Contact lens/glasses fitting    Cough    Depression 02/08/2019   Diabetes mellitus due to underlying condition with unspecified complications (HCC) 11/10/2015   Duodenal ulcer 02/08/2019   Fatigue    Gastroesophageal reflux disease 09/20/2015   GI bleeding 06/27/2019   Hx of laparoscopic gastric banding 01/31/2011   Surgery: 03/22/10    Hyperlipidemia    Insulin long-term use (HCC) 09/20/2015   Melena 02/08/2019   Moderate asthma 02/08/2019   Palpitations 11/10/2015   Poor circulation    Right renal mass 02/08/2019   Sinus problem    Sleep apnea    SOB (shortness of breath)    Symptomatic bradycardia 06/11/2020   Uncontrolled type 2 diabetes mellitus without complication, with long-term current use of insulin 09/20/2015   Past Surgical History:  Procedure Laterality Date   CATARACT EXTRACTION     confirm date with patient   CHOLECYSTECTOMY  1987   EYE SURGERY  2003   retina   HERNIA REPAIR     LAPAROSCOPIC GASTRIC BANDING  03/22/10   PACEMAKER IMPLANT N/A 06/11/2020   Procedure: PACEMAKER IMPLANT;  Surgeon: Lanier Prude, MD;  Location: [facility] INVASIVE CV LAB;  Service: Cardiovascular;  Laterality: N/A;   Social History:  reports that she quit smoking about 23 years  ago. Her smoking use included cigarettes. She has never used smokeless tobacco. She reports that she does not drink alcohol and does not use drugs.  Allergies  Allergen Reactions   Penicillins Hives    At injection site only.  At injection site only., At injection site only., At injection site only.   Sulfa Antibiotics Hives   Erythromycin Other (See Comments)    Unsure    Azithromycin Rash and Other (See Comments)     Broke out the legs   Ciprofloxacin Hcl Other (See Comments)    Abdominal Pain    Family History  Problem Relation Age of Onset   Arthritis Mother    Heart disease Mother    Hypertension Mother    Heart disease Father    Diabetes Father    Diabetes Sister     Prior to Admission medications   Medication Sig Start Date End Date Taking? Authorizing Provider  TYLENOL 8 HOUR 650 MG CR tablet Take 1,300 mg by mouth in the morning and at bedtime.   Yes [provider]  albuterol (VENTOLIN HFA) 108 (90 Base) MCG/ACT inhaler Inhale 2 puffs into the lungs every 6 (six) hours as needed for wheezing or shortness of breath.  04/02/20   [provider]  apixaban (ELIQUIS) 5 MG TABS tablet Take 1 tablet (5 mg total) by mouth 2 (two) times daily. 09/15/22   Revankar, Aundra Dubin, MD  Budeson-Glycopyrrol-Formoterol (BREZTRI AEROSPHERE) 160-9-4.8 MCG/ACT AERO Inhale 2 puffs into the lungs in the morning and at bedtime.    [provider]  cyanocobalamin 1000 MCG tablet Take 1,000 mcg by mouth daily.    [provider]  diclofenac sodium (VOLTAREN) 1 % GEL Apply 4 g topically every 6 (six) hours as needed (pain).  10/23/18   [provider]  empagliflozin (JARDIANCE) 25 MG TABS tablet Take 25 mg by mouth daily.    [provider]  furosemide (LASIX) 40 MG tablet Take 40 mg by mouth daily as needed for fluid or edema.    [provider]  gabapentin (NEURONTIN) 300 MG capsule Take 300 mg by mouth 3 (three) times daily. 07/19/22   [provider]  insulin NPH-regular Human (70-30) 100 UNIT/ML injection Inject 55-90 Units into the skin 2 (two) times daily with a meal. Per sliding scale : not provided    [provider]  linagliptin (TRADJENTA) 5 MG TABS tablet Take 5 mg by mouth daily.    [provider]  lisinopril-hydrochlorothiazide (ZESTORETIC) 20-12.5 MG tablet Take 1 tablet by mouth daily. 08/16/22   [provider]   metoprolol succinate (TOPROL XL) 25 MG 24 hr tablet Take 1 tablet (25 mg total) by mouth daily. 11/08/21   Revankar, Aundra Dubin, MD  omeprazole (PRILOSEC) 40 MG capsule Take 40 mg by mouth daily. 12/29/20   [provider]  ondansetron (ZOFRAN-ODT) 4 MG disintegrating tablet Take 4 mg by mouth every 8 (eight) hours as needed for nausea or vomiting.  06/24/19   [provider]  pantoprazole (PROTONIX) 40 MG tablet Take 40 mg by mouth daily.    [provider]  rOPINIRole (REQUIP) 4 MG tablet Take 4 mg by mouth at bedtime.  10/22/18   [provider]  rosuvastatin (CRESTOR) 10 MG tablet Take 10 mg by mouth at bedtime. 11/07/21   [provider]  traMADol (ULTRAM) 50 MG tablet Take 50 mg by mouth every 6 (six) hours as needed for severe pain. 12/14/14   [provider]    Physical Exam: Vitals:   12/28/22 1647 12/28/22 1651 12/28/22 1730 12/28/22 1801  BP:  (!) 143/63 136/70   Pulse: 67 70 70   Resp: 17     Temp:    97.7 F (36.5 C)  TempSrc:    Oral  SpO2: 97% 95% 94%   Weight:      Height:       General: NAD, morbid obesity Cardiovascular: S1, S2 present Respiratory: Diminished breath sounds bilaterally Abdomen: Soft, nontender, nondistended, bowel sounds present Musculoskeletal: Bilateral lower extremity edema, right lower extremity erythema, noted blister with significant weeping and a shallow skin tear  Skin: As noted above Psychiatry: Normal mood    Data Reviewed:  There are no new results to review at this time.  Assessment and Plan:  Likely venous stasis dermatitis BLE Possible superimposed cellulitis of right lower extremity Currently afebrile, with no leukocytosis Lactic acid WNL BC x 2 pending Bilateral lower extremity Doppler pending Procalcitonin pending Start IV ceftriaxone, vancomycin for now and plan to de-escalate IV Lasix for bilateral lower extremity edema WOC consulted for further management Elevation, may  need Unna boots Monitor closely  Rule out CHF Reports dyspnea, bilateral lower extremity edema BNP 180, although could be falsely lower in obese patients Troponin/EKG pending Chest x-ray unremarkable although body habitus reduces diagnostic sensitivity and specificity Last echo in 2021 with normal EF Repeat echo pending IV Lasix Telemetry  History of COPD Currently on room air Continue inhaler, DuoNebs  Possible stage IIIb CKD Baseline creatinine around 1.2-1.3 Monitor BMP closely while on Lasix  Diabetes mellitus type 2 A1c pending SSI, Accu-Cheks, hypoglycemic protocol  Paroxysmal A-fib Rate controlled Continue metoprolol, Eliquis  Hypertension BP stable Will hold home lisinopril/HCTZ for now [place] as needed  Complete heart block status post pacemaker  GERD Continue PPI  Restless leg syndrome Continue ropinirole  Morbid obesity S/p gastric banding which failed Lifestyle modification advised      Advance Care Planning: DNR  Consults: None  Family Communication: Daughter at bedside  Severity of Illness: The appropriate patient status for this patient is OBSERVATION. Observation status is judged to be reasonable and necessary in order to provide the required intensity of service to ensure the patient's safety. The patient's presenting symptoms, physical exam findings, and initial radiographic and laboratory data in the context of their medical condition is felt to place them at decreased risk for further clinical deterioration. Furthermore, it is anticipated that the patient will be medically stable for discharge from the hospital within 2 midnights of admission.   Author: Briant [place], MD 12/28/2022 6:35 PM  For on call review [email or web address].

## 2022-12-28 NOTE — ED Triage Notes (Signed)
Pt had left ankle wound culture 6/12 and told yesterday to come be admitted for IV antibiotics. Seen at [facility] Tuesday and received 1 round of abx in hospital and dc. Right leg started weeping a few days ago.

## 2022-12-28 NOTE — ED Provider Notes (Signed)
[place] EMERGENCY DEPARTMENT AT [facility] Provider Note   CSN: [id number] Arrival date & time: 12/28/22  1325     History  Chief Complaint  Patient presents with   Leg Pain    Holly Spence is a 77 y.o. female.  77 yo F with to complaints of leg pain and swelling.  This has been an ongoing issue for her.  She has been on cephalexin and clindamycin without improvement.  She saw her doctor who told her that she would come to the hospital to be admitted for IV antibiotics.  She went to [facility] yesterday and they gave her a dose of IV antibiotics and sent her home.  She said her doctor was mad about this and told her she had to come here so that she can be admitted.  She denies any fevers but has had some chills at home.  Has not been taking her Lasix because her kidney doctors told her not to.   Leg Pain      Home Medications Prior to Admission medications   Medication Sig Start Date End Date Taking? Authorizing Provider  ACETAMINOPHEN PO Take 1,300 mg by mouth in the morning and at bedtime. Arthritis Strength    [provider]  albuterol (VENTOLIN HFA) 108 (90 Base) MCG/ACT inhaler Inhale 2 puffs into the lungs every 6 (six) hours as needed for wheezing or shortness of breath.  04/02/20   [provider]  apixaban (ELIQUIS) 5 MG TABS tablet Take 1 tablet (5 mg total) by mouth 2 (two) times daily. 09/15/22   Revankar, Aundra Dubin, MD  Budeson-Glycopyrrol-Formoterol (BREZTRI AEROSPHERE) 160-9-4.8 MCG/ACT AERO Inhale 2 puffs into the lungs in the morning and at bedtime.    [provider]  cyanocobalamin 1000 MCG tablet Take 1,000 mcg by mouth daily.    [provider]  diclofenac sodium (VOLTAREN) 1 % GEL Apply 4 g topically every 6 (six) hours as needed (pain).  10/23/18   [provider]  empagliflozin (JARDIANCE) 25 MG TABS tablet Take 25 mg by mouth daily.    [provider]  furosemide (LASIX) 40 MG tablet  Take 40 mg by mouth daily as needed for fluid or edema.    [provider]  gabapentin (NEURONTIN) 300 MG capsule Take 300 mg by mouth 3 (three) times daily. 07/19/22   [provider]  insulin NPH-regular Human (70-30) 100 UNIT/ML injection Inject 55-90 Units into the skin 2 (two) times daily with a meal. Per sliding scale : not provided    [provider]  linagliptin (TRADJENTA) 5 MG TABS tablet Take 5 mg by mouth daily.    [provider]  lisinopril-hydrochlorothiazide (ZESTORETIC) 20-12.5 MG tablet Take 1 tablet by mouth daily. 08/16/22   [provider]  metoprolol succinate (TOPROL XL) 25 MG 24 hr tablet Take 1 tablet (25 mg total) by mouth daily. 11/08/21   Revankar, Aundra Dubin, MD  omeprazole (PRILOSEC) 40 MG capsule Take 40 mg by mouth daily. 12/29/20   [provider]  ondansetron (ZOFRAN-ODT) 4 MG disintegrating tablet Take 4 mg by mouth every 8 (eight) hours as needed for nausea or vomiting.  06/24/19   [provider]  pantoprazole (PROTONIX) 40 MG tablet Take 40 mg by mouth daily.    [provider]  rOPINIRole (REQUIP) 4 MG tablet Take 4 mg by mouth at bedtime.  10/22/18   [provider]  rosuvastatin (CRESTOR) 10 MG tablet Take 10 mg  by mouth at bedtime. 11/07/21   [provider]  traMADol (ULTRAM) 50 MG tablet Take 50 mg by mouth every 6 (six) hours as needed for severe pain. 12/14/14   [provider]      Allergies    Penicillins, Sulfa antibiotics, Erythromycin, and Ciprofloxacin hcl    Review of Systems   Review of Systems  Physical Exam Updated Vital Signs BP (!) 134/59   Pulse 67   Temp 98.5 F (36.9 C) (Oral)   Resp 17   Ht 5\' 3"  (1.6 m)   Wt (!) 147.4 kg   SpO2 97%   BMI 57.57 kg/m  Physical Exam Vitals and nursing note reviewed.  Constitutional:      General: She is not in acute distress.    Appearance: She is well-developed. She is not diaphoretic.     Comments: BMI  58  HENT:     Head: Normocephalic and atraumatic.  Eyes:     Pupils: Pupils are equal, round, and reactive to light.  Cardiovascular:     Rate and Rhythm: Normal rate and regular rhythm.     Heart sounds: No murmur heard.    No friction rub. No gallop.  Pulmonary:     Effort: Pulmonary effort is normal.     Breath sounds: No wheezing or rales.  Abdominal:     General: There is no distension.     Palpations: Abdomen is soft.     Tenderness: There is no abdominal tenderness.  Musculoskeletal:        General: No tenderness.     Cervical back: Normal range of motion and neck supple.     Right lower leg: Edema present.     Left lower leg: Edema present.     Comments: Bilateral lower extremity edema to the thighs.  Erythema and warmth to bilateral shins.  She has blistering on the right that have ruptured.  No obvious purulent drainage.  No area of fluctuance or induration.  Intact pulse motor and sensation distally.  Skin:    General: Skin is warm and dry.  Neurological:     Mental Status: She is alert and oriented to person, place, and time.  Psychiatric:        Behavior: Behavior normal.     ED Results / Procedures / Treatments   Labs (all labs ordered are listed, but only abnormal results are displayed) Labs Reviewed  COMPREHENSIVE METABOLIC PANEL - Abnormal; Notable for the following components:      Result Value   Glucose, Bld 168 (*)    BUN 29 (*)    Creatinine, Ser 1.28 (*)    Calcium 8.4 (*)    Albumin 3.2 (*)    AST 12 (*)    GFR, Estimated 43 (*)    All other components within normal limits  CBC WITH DIFFERENTIAL/PLATELET - Abnormal; Notable for the following components:   Hemoglobin 11.6 (*)    MCHC 29.2 (*)    All other components within normal limits  CULTURE, BLOOD (ROUTINE X 2)  CULTURE, BLOOD (ROUTINE X 2)  LACTIC ACID, PLASMA  LACTIC ACID, PLASMA    EKG None  Radiology No results found.  Procedures Procedures    Medications Ordered in  ED Medications  fluconazole (DIFLUCAN) tablet 150 mg (has no administration in time range)  cefTRIAXone (ROCEPHIN) 2 g in sodium chloride 0.9 % 100 mL IVPB (has no administration in time range)  furosemide (LASIX) injection 40 mg (has no administration in  time range)    ED Course/ Medical Decision Making/ A&P                             Medical Decision Making Amount and/or Complexity of Data Reviewed Labs: ordered.  Risk Prescription drug management. Decision regarding hospitalization.   77 yo F with chief complaint of bilateral lower extremity erythema and edema.  This has been an ongoing issue for her.  She tried a couple different rounds of antibiotics as an outpatient without improvement.  I think she has an exam more consistent with erythema from chronic leg edema.  Regardless her doctor sent her here for IV antibiotics and admission.  Will give a dose of Lasix as well.  Will discuss with medicine.  The patients results and plan were reviewed and discussed.   Any x-rays performed were independently reviewed by myself.   Differential diagnosis were considered with the presenting HPI.  Medications  fluconazole (DIFLUCAN) tablet 150 mg (has no administration in time range)  cefTRIAXone (ROCEPHIN) 2 g in sodium chloride 0.9 % 100 mL IVPB (has no administration in time range)  furosemide (LASIX) injection 40 mg (has no administration in time range)    Vitals:   12/28/22 1338 12/28/22 1342 12/28/22 1600 12/28/22 1647  BP: (!) 144/54  (!) 134/59   Pulse: 70  70 67  Resp: 17  17 17   Temp: 98.5 F (36.9 C)     TempSrc: Oral     SpO2: 97%  97% 97%  Weight:  (!) 147.4 kg    Height:  5\' 3"  (1.6 m)      Final diagnoses:  Venous stasis dermatitis    Admission/ observation were discussed with the admitting physician, patient and/or family and they are comfortable with the plan.          Final Clinical Impression(s) / ED Diagnoses Final diagnoses:  Venous stasis  dermatitis    Rx / DC Orders ED Discharge Orders     None         Melene Plan, DO 12/28/22 1649

## 2022-12-29 ENCOUNTER — Observation Stay (HOSPITAL_COMMUNITY)
Admit: 2022-12-29 | Discharge: 2022-12-29 | Disposition: A | Discharge status: Home / Self Care | Payer: Medicare Other | Attending: Internal Medicine | Admitting: Internal Medicine

## 2022-12-29 ENCOUNTER — Observation Stay (HOSPITAL_COMMUNITY): Payer: Medicare Other

## 2022-12-29 DIAGNOSIS — J4489 Other specified chronic obstructive pulmonary disease: Secondary | ICD-10-CM | POA: Diagnosis present

## 2022-12-29 DIAGNOSIS — L03115 Cellulitis of right lower limb: Secondary | ICD-10-CM | POA: Diagnosis not present

## 2022-12-29 DIAGNOSIS — Z6841 Body Mass Index (BMI) 40.0 and over, adult: Secondary | ICD-10-CM | POA: Diagnosis not present

## 2022-12-29 DIAGNOSIS — E1122 Type 2 diabetes mellitus with diabetic chronic kidney disease: Secondary | ICD-10-CM | POA: Diagnosis present

## 2022-12-29 DIAGNOSIS — M7989 Other specified soft tissue disorders: Secondary | ICD-10-CM | POA: Diagnosis not present

## 2022-12-29 DIAGNOSIS — I5032 Chronic diastolic (congestive) heart failure: Secondary | ICD-10-CM | POA: Diagnosis present

## 2022-12-29 DIAGNOSIS — R52 Pain, unspecified: Secondary | ICD-10-CM | POA: Diagnosis not present

## 2022-12-29 DIAGNOSIS — G2581 Restless legs syndrome: Secondary | ICD-10-CM | POA: Diagnosis present

## 2022-12-29 DIAGNOSIS — Z66 Do not resuscitate: Secondary | ICD-10-CM | POA: Diagnosis present

## 2022-12-29 DIAGNOSIS — I872 Venous insufficiency (chronic) (peripheral): Secondary | ICD-10-CM | POA: Diagnosis present

## 2022-12-29 DIAGNOSIS — Z794 Long term (current) use of insulin: Secondary | ICD-10-CM | POA: Diagnosis not present

## 2022-12-29 DIAGNOSIS — Z8616 Personal history of COVID-19: Secondary | ICD-10-CM | POA: Diagnosis not present

## 2022-12-29 DIAGNOSIS — F32A Depression, unspecified: Secondary | ICD-10-CM | POA: Diagnosis present

## 2022-12-29 DIAGNOSIS — K219 Gastro-esophageal reflux disease without esophagitis: Secondary | ICD-10-CM | POA: Diagnosis present

## 2022-12-29 DIAGNOSIS — Z8249 Family history of ischemic heart disease and other diseases of the circulatory system: Secondary | ICD-10-CM | POA: Diagnosis not present

## 2022-12-29 DIAGNOSIS — E785 Hyperlipidemia, unspecified: Secondary | ICD-10-CM | POA: Diagnosis present

## 2022-12-29 DIAGNOSIS — L538 Other specified erythematous conditions: Secondary | ICD-10-CM

## 2022-12-29 DIAGNOSIS — Z7984 Long term (current) use of oral hypoglycemic drugs: Secondary | ICD-10-CM | POA: Diagnosis not present

## 2022-12-29 DIAGNOSIS — I480 Paroxysmal atrial fibrillation: Secondary | ICD-10-CM | POA: Diagnosis present

## 2022-12-29 DIAGNOSIS — E1151 Type 2 diabetes mellitus with diabetic peripheral angiopathy without gangrene: Secondary | ICD-10-CM | POA: Diagnosis present

## 2022-12-29 DIAGNOSIS — Z87891 Personal history of nicotine dependence: Secondary | ICD-10-CM | POA: Diagnosis not present

## 2022-12-29 DIAGNOSIS — R0609 Other forms of dyspnea: Secondary | ICD-10-CM

## 2022-12-29 DIAGNOSIS — I272 Pulmonary hypertension, unspecified: Secondary | ICD-10-CM | POA: Diagnosis not present

## 2022-12-29 DIAGNOSIS — Z7901 Long term (current) use of anticoagulants: Secondary | ICD-10-CM | POA: Diagnosis not present

## 2022-12-29 DIAGNOSIS — I442 Atrioventricular block, complete: Secondary | ICD-10-CM | POA: Diagnosis present

## 2022-12-29 DIAGNOSIS — I130 Hypertensive heart and chronic kidney disease with heart failure and stage 1 through stage 4 chronic kidney disease, or unspecified chronic kidney disease: Secondary | ICD-10-CM | POA: Diagnosis present

## 2022-12-29 DIAGNOSIS — N1832 Chronic kidney disease, stage 3b: Secondary | ICD-10-CM | POA: Diagnosis present

## 2022-12-29 LAB — CBC
HCT: 35.7 % — ABNORMAL LOW (ref 36.0–46.0)
Hemoglobin: 10.5 g/dL — ABNORMAL LOW (ref 12.0–15.0)
MCH: 26.4 pg (ref 26.0–34.0)
MCHC: 29.4 g/dL — ABNORMAL LOW (ref 30.0–36.0)
MCV: 89.9 fL (ref 80.0–100.0)
Platelets: 199 10*3/uL (ref 150–400)
RBC: 3.97 MIL/uL (ref 3.87–5.11)
RDW: 15.5 % (ref 11.5–15.5)
WBC: 8 10*3/uL (ref 4.0–10.5)
nRBC: 0 % (ref 0.0–0.2)

## 2022-12-29 LAB — BASIC METABOLIC PANEL
Anion gap: 8 (ref 5–15)
BUN: 27 mg/dL — ABNORMAL HIGH (ref 8–23)
CO2: 27 mmol/L (ref 22–32)
Calcium: 8.2 mg/dL — ABNORMAL LOW (ref 8.9–10.3)
Chloride: 102 mmol/L (ref 98–111)
Creatinine, Ser: 1.26 mg/dL — ABNORMAL HIGH (ref 0.44–1.00)
GFR, Estimated: 43 mL/min — ABNORMAL LOW (ref >60)
Glucose, Bld: 180 mg/dL — ABNORMAL HIGH (ref 70–99)
Potassium: 4.3 mmol/L (ref 3.5–5.1)
Sodium: 137 mmol/L (ref 135–145)

## 2022-12-29 LAB — GLUCOSE, CAPILLARY
Glucose-Capillary: 166 mg/dL — ABNORMAL HIGH (ref 70–99)
Glucose-Capillary: 165 mg/dL — ABNORMAL HIGH (ref 70–99)
Glucose-Capillary: 249 mg/dL — ABNORMAL HIGH (ref 70–99)
Glucose-Capillary: 195 mg/dL — ABNORMAL HIGH (ref 70–99)

## 2022-12-29 LAB — TROPONIN I (HIGH SENSITIVITY): Troponin I (High Sensitivity): 13 ng/L (ref <18)

## 2022-12-29 LAB — PROCALCITONIN: Procalcitonin: <0.1 ng/mL

## 2022-12-29 LAB — ECHOCARDIOGRAM COMPLETE: Weight: 5200 oz

## 2022-12-29 MED ORDER — PERFLUTREN LIPID MICROSPHERE
1.0000 mL | INTRAVENOUS | Status: AC | PRN
  Administered 2022-12-29: 4 mL via INTRAVENOUS

## 2022-12-29 MED ORDER — ZINC OXIDE 40 % EX OINT
TOPICAL_OINTMENT | Freq: Two times a day (BID) | CUTANEOUS | Status: DC
  Filled 2022-12-29: qty 57

## 2022-12-29 MED ORDER — LIP MEDEX EX OINT
1.0000 | TOPICAL_OINTMENT | CUTANEOUS | Status: DC | PRN
  Filled 2022-12-29: qty 7

## 2022-12-29 NOTE — Progress Notes (Signed)
PROGRESS NOTE  Holly Spence WGN:[id number] DOB: 1945-09-17 DOA: 12/28/2022 PCP: [facility]  HPI/Recap of past 24 hours: Holly Spence is a 77 y.o. female with medical history significant of diabetes mellitus type 2, hyperlipidemia, hypertension, obstructive sleep apnea, PVD, status post pacemaker, GERD, COPD, morbid obesity presents to the ED complaining of bilateral lower extremity swelling, erythema and noted wounds especially on the right lower extremity.  Patient admitted for further management.   Today, patient denies any new complaints.  Right lower extremity still weeping.    Assessment/Plan: Principal Problem:   Cellulitis of right leg Active Problems:   Diabetes mellitus due to underlying condition with unspecified complications (HCC)   Benign essential hypertension   Complete heart block (HCC)   Sleep apnea  Likely venous stasis dermatitis BLE Possible superimposed cellulitis of right lower extremity Currently afebrile, with no leukocytosis Lactic acid WNL BC x 2 NGTD Bilateral lower extremity Doppler negative for DVT, although technically difficult study Procalcitonin negative Continue ceftriaxone, vancomycin for now and plan to de-escalate IV Lasix for bilateral lower extremity edema WOC consulted for further management Elevation, may need Unna boots Monitor closely   Rule out CHF Reports dyspnea, bilateral lower extremity edema BNP 180, although could be falsely lower in obese patients Troponin WNL, EKG pending Chest x-ray unremarkable although body habitus reduces diagnostic sensitivity and specificity Last echo in 2021 with normal EF Repeat echo pending IV Lasix Telemetry   History of COPD Currently on room air Continue inhaler, DuoNebs   Possible stage IIIb CKD Baseline creatinine around 1.2-1.3 Monitor BMP closely while on Lasix   Diabetes mellitus type 2 A1c 7.7 SSI, Accu-Cheks, hypoglycemic protocol   Paroxysmal  A-fib Rate controlled Continue metoprolol, Eliquis   Hypertension BP stable Will hold home lisinopril/HCTZ for now IV Hydralazine as needed as needed   Complete heart block status post pacemaker   GERD Continue PPI   Restless leg syndrome Continue ropinirole   Morbid obesity S/p gastric banding which failed Lifestyle modification advised     Estimated body mass index is 57.57 kg/m as calculated from the following:   Height as of this encounter: 5\' 3"  (1.6 m).   Weight as of this encounter: 147.4 kg.     Code Status: Full code  Family Communication: None at bedside  Disposition Plan: Status is: Inpatient Remains inpatient appropriate because: Level of care      Consultants: None  Procedures: None  Antimicrobials: Ceftriaxone Vancomycin  DVT prophylaxis: Eliquis   Objective: Vitals:   12/29/22 0934 12/29/22 1301 12/29/22 1332 12/29/22 1345  BP:  135/61 (!) 155/66   Pulse:  70 71   Resp:  18    Temp:   98 F (36.7 C)   TempSrc:   Oral   SpO2: 97% 94% 98% 96%  Weight:      Height:        Intake/Output Summary (Last 24 hours) at 12/29/2022 1656 Last data filed at 12/29/2022 1340 Gross per 24 hour  Intake 580 ml  Output 400 ml  Net 180 ml   Filed Weights   12/28/22 1342  Weight: (!) 147.4 kg    Exam: General: NAD, morbid obesity Cardiovascular: S1, S2 present Respiratory: Diminished breath sounds bilaterally Abdomen: Soft, nontender, nondistended, bowel sounds present Musculoskeletal: bilateral pedal edema noted, RLE erythema, significant weeping with shallow skin tear Skin: As noted above Psychiatry: Normal mood     Data Reviewed: CBC: Recent Labs  Lab 12/28/22 1551 12/29/22 7846  WBC 8.6 8.0  NEUTROABS 6.2  --   HGB 11.6* 10.5*  HCT 39.7 35.7*  MCV 89.6 89.9  PLT 219 199   Basic Metabolic Panel: Recent Labs  Lab 12/28/22 1551 12/29/22 0627  NA 138 137  K 4.2 4.3  CL 106 102  CO2 27 27  GLUCOSE 168* 180*  BUN  29* 27*  CREATININE 1.28* 1.26*  CALCIUM 8.4* 8.2*   GFR: Estimated Creatinine Clearance: 51.7 mL/min (A) (by C-G formula based on SCr of 1.26 mg/dL (H)). Liver Function Tests: Recent Labs  Lab 12/28/22 1551  AST 12*  ALT 12  ALKPHOS 70  BILITOT 0.6  PROT 6.5  ALBUMIN 3.2*   No results for input(s): "LIPASE", "AMYLASE" in the last 168 hours. No results for input(s): "AMMONIA" in the last 168 hours. Coagulation Profile: No results for input(s): "INR", "PROTIME" in the last 168 hours. Cardiac Enzymes: No results for input(s): "CKTOTAL", "CKMB", "CKMBINDEX", "TROPONINI" in the last 168 hours. BNP (last 3 results) No results for input(s): "PROBNP" in the last 8760 hours. HbA1C: Recent Labs    12/28/22 1809  HGBA1C 7.7*   CBG: Recent Labs  Lab 12/28/22 2120 12/29/22 0734 12/29/22 1148 12/29/22 1654  GLUCAP 109* 166* 165* 249*   Lipid Profile: No results for input(s): "CHOL", "HDL", "LDLCALC", "TRIG", "CHOLHDL", "LDLDIRECT" in the last 72 hours. Thyroid Function Tests: No results for input(s): "TSH", "T4TOTAL", "FREET4", "T3FREE", "THYROIDAB" in the last 72 hours. Anemia Panel: No results for input(s): "VITAMINB12", "FOLATE", "FERRITIN", "TIBC", "IRON", "RETICCTPCT" in the last 72 hours. Urine analysis:    Component Value Date/Time   COLORURINE YELLOW 07/15/2022 2012   APPEARANCEUR CLOUDY (A) 07/15/2022 2012   LABSPEC 1.014 07/15/2022 2012   PHURINE 5.0 07/15/2022 2012   GLUCOSEU >=500 (A) 07/15/2022 2012   HGBUR SMALL (A) 07/15/2022 2012   BILIRUBINUR NEGATIVE 07/15/2022 2012   KETONESUR NEGATIVE 07/15/2022 2012   PROTEINUR NEGATIVE 07/15/2022 2012   NITRITE NEGATIVE 07/15/2022 2012   LEUKOCYTESUR LARGE (A) 07/15/2022 2012   Sepsis Labs: @LABRCNTIP (procalcitonin:4,lacticidven:4)  ) Recent Results (from the past 240 hour(s))  Blood culture (routine x 2)     Status: None (Preliminary result)   Collection Time: 12/28/22  5:18 PM   Specimen: BLOOD  Result  Value Ref Range Status   Specimen Description   Final    BLOOD BLOOD LEFT WRIST Performed at [facility], [address], [place], [place] [address]    Special Requests   Final    BOTTLES DRAWN AEROBIC AND ANAEROBIC Blood Culture results may not be optimal due to an excessive volume of blood received in culture bottles Performed at [facility], [address] [place], [place] [address]    Culture   Final    NO GROWTH < 12 HOURS Performed at [facility], [address], [place], [place] [address]    Report Status PENDING  Incomplete  Blood culture (routine x 2)     Status: None (Preliminary result)   Collection Time: 12/28/22  5:28 PM   Specimen: BLOOD  Result Value Ref Range Status   Specimen Description   Final    BLOOD BLOOD RIGHT FOREARM Performed at [facility], [address], [place], [place] [address]    Special Requests   Final    BOTTLES DRAWN AEROBIC AND ANAEROBIC Blood Culture adequate volume Performed at [facility], [address], [place], [place] [address]    Culture  Final    NO GROWTH < 12 HOURS Performed at [facility], [address], [place], [place] [address]    Report Status PENDING  Incomplete      Studies: VAS [place] LOWER EXTREMITY VENOUS (DVT)  Result Date: 12/29/2022  Lower Venous DVT Study Patient Name:  Holly Spence  Date of Exam:   12/29/2022 Medical Rec #: [id number]       Accession #:    [id number] Date of Birth: Feb 03, 1946       Patient Gender: F Patient Age:   26 years Exam Location:  [facility] Procedure:      VAS [place] LOWER EXTREMITY VENOUS (DVT) Referring Phys: [facility] [place] --------------------------------------------------------------------------------  Indications: BLE pain, swelling, erythema, and wounds.  Limitations: Very limited exam due to body habitus (BMI 58), poor ultrasound/ tissue interface. Comparison Study: No previous exams  Performing Technologist: Jody Hill RVT, RDMS  Examination Guidelines: A complete evaluation includes B-mode imaging, spectral Doppler, color Doppler, and power Doppler as needed of all accessible portions of each vessel. Bilateral testing is considered an integral part of a complete examination. Limited examinations for reoccurring indications may be performed as noted. The reflux portion of the exam is performed with the patient in reverse Trendelenburg.  +---------+---------------+---------+-----------+----------+-------------------+ RIGHT    CompressibilityPhasicitySpontaneityPropertiesThrombus Aging      +---------+---------------+---------+-----------+----------+-------------------+ CFV      Full           Yes      Yes                                      +---------+---------------+---------+-----------+----------+-------------------+ SFJ      Full                                                             +---------+---------------+---------+-----------+----------+-------------------+ FV Prox  Full           Yes      Yes                                      +---------+---------------+---------+-----------+----------+-------------------+ FV Mid   Full           Yes      Yes                                      +---------+---------------+---------+-----------+----------+-------------------+ FV DistalFull           Yes      Yes                                      +---------+---------------+---------+-----------+----------+-------------------+ PFV      Full                                                             +---------+---------------+---------+-----------+----------+-------------------+  POP      Full           Yes      Yes                                      +---------+---------------+---------+-----------+----------+-------------------+ PTV      Full                                         Not well visualized  +---------+---------------+---------+-----------+----------+-------------------+ PERO     Full                                         Not well visualized +---------+---------------+---------+-----------+----------+-------------------+   +---------+---------------+---------+-----------+----------+-------------------+ LEFT     CompressibilityPhasicitySpontaneityPropertiesThrombus Aging      +---------+---------------+---------+-----------+----------+-------------------+ CFV      Full           Yes      Yes                                      +---------+---------------+---------+-----------+----------+-------------------+ SFJ      Full                                                             +---------+---------------+---------+-----------+----------+-------------------+ FV Prox  Full           Yes      Yes                                      +---------+---------------+---------+-----------+----------+-------------------+ FV Mid                                                Not visualized      +---------+---------------+---------+-----------+----------+-------------------+ FV DistalFull           Yes      Yes                                      +---------+---------------+---------+-----------+----------+-------------------+ PFV                                                   Not visualized      +---------+---------------+---------+-----------+----------+-------------------+ POP      Full           Yes      Yes                                      +---------+---------------+---------+-----------+----------+-------------------+  PTV      Full                                         Not well visualized +---------+---------------+---------+-----------+----------+-------------------+ PERO     Full                                         Not well visualized +---------+---------------+---------+-----------+----------+-------------------+    Left Technical Findings: Not visualized segments include PFV, MID FV.   Summary: BILATERAL: -No evidence of popliteal cyst, bilaterally. -Diffuse subcutaneous edema, bilaterally.  RIGHT: - There is no evidence of deep vein thrombosis in the lower extremity. However, portions of this examination were limited- see technologist comments above.  LEFT: - There is no evidence of deep vein thrombosis in the lower extremity. However, portions of this examination were limited- see technologist comments above.  *See table(s) above for measurements and observations.    Preliminary    DG CHEST PORT 1 VIEW  Result Date: 12/28/2022 CLINICAL DATA:  Shortness of breath EXAM: PORTABLE CHEST 1 VIEW COMPARISON:  07/15/2022 FINDINGS: Body habitus reduces diagnostic sensitivity and specificity. Dual lead pacer remains in place. The patient is rotated to the left on today's radiograph, reducing diagnostic sensitivity and specificity. Borderline enlargement of the cardiopericardial silhouette. The lungs appear clear. No blunting of the costophrenic angles. Thoracic spondylosis is noted. IMPRESSION: 1. Borderline enlargement of the cardiopericardial silhouette. Dual lead pacer noted. 2. Thoracic spondylosis. Electronically Signed   By: Gaylyn Rong M.D.   On: 12/28/2022 17:59    Scheduled Meds:  apixaban  5 mg Oral BID   furosemide  40 mg Intravenous Daily   insulin aspart  0-15 Units Subcutaneous TID WC   insulin aspart  0-5 Units Subcutaneous QHS   ipratropium-albuterol  3 mL Nebulization TID   liver oil-zinc oxide   Topical BID   metoprolol succinate  25 mg Oral Daily   pantoprazole  40 mg Oral Daily   rOPINIRole  4 mg Oral QHS   rosuvastatin  10 mg Oral QHS    Continuous Infusions:  cefTRIAXone (ROCEPHIN)  IV 2 g (12/29/22 1141)   [START ON 12/30/2022] vancomycin       LOS: 0 days     Briant Cedar, MD [facility]  If 7PM-7AM, please contact night-coverage [email or web address] 12/29/2022, 4:56  PM

## 2022-12-29 NOTE — Progress Notes (Signed)
PT Cancellation Note  Patient Details Name: Holly Spence MRN: [id number] DOB: 1945/12/21   Cancelled Treatment:    Reason Eval/Treat Not Completed: Results pending at this time from B LE doppler this am. PT will eval when appropriate and continue to follow acutely.   Johnny Bridge, PT Acute Rehab   Jacqualyn Posey 12/29/2022, 2:08 PM

## 2022-12-29 NOTE — Evaluation (Signed)
Physical Therapy Evaluation Patient Details Name: MAKISHA MARRIN MRN: [id number] DOB: 06-10-1946 Today's Date: 12/29/2022  History of Present Illness  77 yo female presented to ED 12/28/2022 due SOB, leg pain and B LE edema, R LE wound weeping with skin tear and blister with chronic L LE wound. Pt admitted with R LE cellulitis. Pt is pending B LE doppler. Pt presented to [facility] on 6/19 and one dose of IV ABX and returned home as well as 2 visits to PCP. Pt reports she has not been taking her Lasix. Pt PMH includes but is not limited to DM II, HTN, HDL, HTN, OSA, PVD, s/p pacemaker placement, GERD, COPD, PAF, RLS and LBP.  Clinical Impression    Pt admitted with above diagnosis.  Pt currently with functional limitations due to the deficits listed below (see PT Problem List). Pt seated EOB when PT arrived. Pt agreeable to therapy intervention. Pt reports she required assist to maneuver B LE to EOB. Pt required min guard and cues for sit to stand from EOB, B UE support for static standing balance and gait task 30 feet with RW and cues for proper posture and positioning from RW, pt indicated limited gait distances in home and fatigues quickly, reported SOB and 94% on RA. Pt left seated in recliner and all needs in place. Pt will benefit from acute skilled PT to increase their independence and safety with mobility to allow discharge.        Recommendations for follow up therapy are one component of a multi-disciplinary discharge planning process, led by the attending physician.  Recommendations may be updated based on patient status, additional functional criteria and insurance authorization.  Follow Up Recommendations       Assistance Recommended at Discharge Intermittent Supervision/Assistance  Patient can return home with the following  A little help with walking and/or transfers;A lot of help with bathing/dressing/bathroom;Assistance with cooking/housework;Assist for transportation;Help  with stairs or ramp for entrance    Equipment Recommendations None recommended by PT (pt reports DME in home setting)  Recommendations for Other Services       Functional Status Assessment Patient has had a recent decline in their functional status and demonstrates the ability to make significant improvements in function in a reasonable and predictable amount of time.     Precautions / Restrictions Precautions Precautions: Fall;ICD/Pacemaker Restrictions Weight Bearing Restrictions: No      Mobility  Bed Mobility               General bed mobility comments: pt seated EOB when therapist arrived. pt reports she had to have help to move her legs and her husband helps her in home setting    Transfers Overall transfer level: Needs assistance Equipment used: Rolling walker (2 wheels) Transfers: Sit to/from Stand Sit to Stand: Min guard           General transfer comment: pull to stand with PT stabilizing RW, pt will benefit from reinforcement for safe and proper transfer technique including UE and AD placement    Ambulation/Gait Ambulation/Gait assistance: Min guard Gait Distance (Feet): 30 Feet Assistive device: Rolling walker (2 wheels) Gait Pattern/deviations: Step-to pattern, Wide base of support, Shuffle Gait velocity: decreased     General Gait Details: cues for proper posture and distance from RW, pt indicates her back hurts and makes it hard for her to stand upright, c/o SOB and O2 saturation 94% on RA  Stairs  Wheelchair Mobility    Modified Rankin (Stroke Patients Only)       Balance Overall balance assessment: Needs assistance Sitting-balance support: Feet supported Sitting balance-Leahy Scale: Good     Standing balance support: Bilateral upper extremity supported, During functional activity, Reliant on assistive device for balance Standing balance-Leahy Scale: Poor                               Pertinent  Vitals/Pain Pain Assessment Pain Assessment: 0-10 Pain Score: 7  Pain Location: B LE Pain Descriptors / Indicators: Burning, Radiating, Discomfort Pain Intervention(s): Limited activity within patient's tolerance, Monitored during session, Premedicated before session, Ice applied    Home Living Family/patient expects to be discharged to:: Private residence Living Arrangements: Spouse/significant other (daughter) Available Help at Discharge: Family Type of Home: House Home Access: Ramped entrance (5-8 steps)       Home Layout: One level Home Equipment: Pharmacist, hospital (2 wheels);Wheelchair - power;Cane - single point      Prior Function Prior Level of Function : Needs assist       Physical Assist : Mobility (physical);ADLs (physical) Mobility (physical): Bed mobility ADLs (physical): Dressing;Toileting;IADLs;Bathing;Grooming (pt reports she is unable to put her hair in a pony tail. husband assist as able has bad back and daughter medically complex) Mobility Comments: pt has limited assist in home setting with husband and daughter pt report taking sponge bath, requires some assist over the past month attributed to pulled mm in groin for lower body dressing, toilet hygiene, uses SPC for amb short distances in home however primarily reliant on power wc, no longer driving ADLs Comments: pt reports ordering groceries, hushand IADLs and pt limits time out of the home for the exeption of MD apointments     Hand Dominance   Dominant Hand: Right    Extremity/Trunk Assessment   Upper Extremity Assessment Upper Extremity Assessment: Defer to OT evaluation (R UE ROM and MMT deficits noted) RUE Deficits / Details: AROM and strength WFL LUE Deficits / Details: Chronic shoulder AROM limitations. Functional grip strength    Lower Extremity Assessment Lower Extremity Assessment: Generalized weakness (pain in R grion, grossly 3/5 R LE and 3+/5 LLE, B distal abn sensation due to  peripherial neuropathy)    Cervical / Trunk Assessment Cervical / Trunk Assessment: Kyphotic  Communication   Communication: No difficulties  Cognition Arousal/Alertness: Awake/alert Behavior During Therapy: WFL for tasks assessed/performed Overall Cognitive Status: Within Functional Limits for tasks assessed                                          General Comments      Exercises     Assessment/Plan    PT Assessment Patient needs continued PT services  PT Problem List Decreased strength;Decreased range of motion;Decreased activity tolerance;Decreased balance;Decreased mobility;Decreased coordination;Cardiopulmonary status limiting activity;Pain;Obesity       PT Treatment Interventions DME instruction;Gait training;Stair training;Functional mobility training;Therapeutic activities;Therapeutic exercise;Balance training;Neuromuscular re-education;Patient/family education    PT Goals (Current goals can be found in the Care Plan section)  Acute Rehab PT Goals Patient Stated Goal: have the energy to craft and be as active and IND as I can PT Goal Formulation: With patient Time For Goal Achievement: 01/12/23 Potential to Achieve Goals: Good    Frequency Min 1X/week     Co-evaluation PT/OT/SLP Co-Evaluation/Treatment:  Yes Reason for Co-Treatment: Complexity of the patient's impairments (multi-system involvement);To address functional/ADL transfers PT goals addressed during session: Mobility/safety with mobility;Balance;Proper use of DME OT goals addressed during session: ADL's and self-care;Strengthening/ROM       AM-PAC PT "6 Clicks" Mobility  Outcome Measure Help needed turning from your back to your side while in a flat bed without using bedrails?: A Lot Help needed moving from lying on your back to sitting on the side of a flat bed without using bedrails?: A Lot Help needed moving to and from a bed to a chair (including a wheelchair)?: A Little Help  needed standing up from a chair using your arms (e.g., wheelchair or bedside chair)?: A Little Help needed to walk in hospital room?: A Little Help needed climbing 3-5 steps with a railing? : Total 6 Click Score: 14    End of Session Equipment Utilized During Treatment: Gait belt Activity Tolerance: Patient limited by fatigue Patient left: in chair;with call bell/phone within reach Nurse Communication: Mobility status PT Visit Diagnosis: Unsteadiness on feet (R26.81);Other abnormalities of gait and mobility (R26.89);Muscle weakness (generalized) (M62.81);Difficulty in walking, not elsewhere classified (R26.2);Pain Pain - part of body:  (multiple areas of pain R UE, B LE, groin, back)    Time: 8416-6063 PT Time Calculation (min) (ACUTE ONLY): 26 min   Charges:   PT Evaluation $PT Eval Low Complexity: 1 Low PT Treatments $Gait Training: 8-22 mins        Johnny Bridge, PT Acute Rehab   Jacqualyn Posey 12/29/2022, 5:12 PM

## 2022-12-29 NOTE — Progress Notes (Signed)
BLE venous duplex has been completed.   Results can be found under chart review under CV PROC. 12/29/2022 3:19 PM Briley Bumgarner RVT, RDMS

## 2022-12-29 NOTE — Consult Note (Addendum)
WOC Nurse Consult Note: Reason for Consult: Bilateral LEs with edema, RLE with ruptured blister (partial thickness skin injury), incontinence associated dermatitis/ICD.  ABIs last year are >1.0 bilaterally and may be falsely elevated due to non compressible vessels. Patient has been seen by Vascular Surgery in the past.  ICD-10 CM Codes for Irritant Dermatitis  L24A2 - Due to fecal, urinary or dual incontinence L24A9 - Due to friction or contact with other specified body fluids L30.4  - Erythema intertrigo. Also used for abrasion of the hand, chafing of the skin, dermatitis due to sweating and friction, friction dermatitis, friction eczema, and genital/thigh intertrigo.   Wound type: Partial thickness, infectious, irritant contact dermatitis Pressure Injury POA: N/A Measurement:RLE wound to be measured by Bedside RN and measurements documented on the nursing flow sheet with next dressing application today Wound bed:red, moist Drainage (amount, consistency, odor) serous, moderate Periwound:erythematous, edeamatous Dressing procedure/placement/frequency: I have provided guidance for Nursing using a silicone foam to the sacrum for PI prevention. Turning and repositioning is in place, but I have added that patient is to minimize time in the supine position. Heels are provided with pressure redistribution heel boots for floatation.  Topical care to the RLE will be with daily cleansing of the LEs followed by covering the4 lesion with an antimicrobial nonadherent, xeroform. This is to be covered with an ABD pad and secured by wrapping the Le from just below toes to just below knees with Kerlix roll gauze/paper tpae. The Kerlix is to be topped with a 6-inch ACE bandage applied in a similar manner.  Recommend follow up by Vascular Surgery post discharge for establishment of a POC for continued compression after wound healing.  If you agree, please order/arrange.  WOC nursing team will not follow, but  will remain available to this patient, the nursing and medical teams.  Please re-consult if needed.  Thank you for inviting [place] to participate in this patient's Plan of Care.  Ladona Mow, MSN, RN, CNS, GNP, Leda Min, [employer], [employer] phone:  [phone]

## 2022-12-29 NOTE — Evaluation (Signed)
Occupational Therapy Evaluation Patient Details Name: Holly Spence MRN: [id number] DOB: 1946/05/05 Today's Date: 12/29/2022   History of Present Illness 77 yo female presented to ED 12/28/2022 due SOB, leg pain and B LE edema, R LE wound weeping with skin tear and blister with chronic L LE wound. Pt admitted with R LE cellulitis. PMH: DM II, HTN, HDL, HTN, OSA, PVD, s/p pacemaker placement, GERD, COPD, PAF, RLS and LBP.   Clinical Impression    Pt reports needing assist from her spouse for lower body dressing and toileting over the past month, given onset of symptoms including BLE and R groin pain. She also reported her and her family are unable to manage household chores, including cleaning, given their physical and medical limitations. Today, she required mod assist for lower body dressing, min guard assist to stand, and min guard assist to ambulate using a RW. She presented with very quick fatigue/compromised endurance, BLE pain, shortness of breath with activity, slight generalized weakness, and deconditioning. She will benefit from OT services to maximize her ADL performance and to decrease the risk for restricted participation in meaningful activities. She may also benefit from home health aide services, given her difficulty managing household tasks and recent self-care tasks.     Recommendations for follow up therapy are one component of a multi-disciplinary discharge planning process, led by the attending physician.  Recommendations may be updated based on patient status, additional functional criteria and insurance authorization.   Assistance Recommended at Discharge Intermittent Supervision/Assistance  Patient can return home with the following Assist for transportation;Assistance with cooking/housework;A little help with bathing/dressing/bathroom    Functional Status Assessment  Patient has had a recent decline in their functional status and demonstrates the ability to make significant  improvements in function in a reasonable and predictable amount of time.  Equipment Recommendations  None recommended by OT       Precautions / Restrictions Precautions Precautions: Fall;ICD/Pacemaker Restrictions Weight Bearing Restrictions: No      Mobility Bed Mobility      General bed mobility comments: pt was received seated EOB    Transfers Overall transfer level: Needs assistance Equipment used: Rolling walker (2 wheels) Transfers: Sit to/from Stand Sit to Stand: Min guard                  Balance     Sitting balance-Leahy Scale: Good         Standing balance comment: min guard with RW           ADL either performed or assessed with clinical judgement   ADL Overall ADL's : Needs assistance/impaired Eating/Feeding: Independent;Sitting   Grooming: Set up;Sitting           Upper Body Dressing : Set up;Sitting   Lower Body Dressing: Moderate assistance Lower Body Dressing Details (indicate cue type and reason): She required assist for sock management seated EOB. Toilet Transfer: Ambulation;Rolling walker (2 wheels);Min guard                   Vision Baseline Vision/History: 1 Wears glasses              Pertinent Vitals/Pain Pain Assessment Pain Assessment: 0-10 Pain Score: 7  Pain Location: B LE Pain Intervention(s): Limited activity within patient's tolerance, Monitored during session, Repositioned     Hand Dominance Right   Extremity/Trunk Assessment Upper Extremity Assessment Upper Extremity Assessment: RUE deficits/detail;LUE deficits/detail RUE Deficits / Details: AROM and strength WFL LUE Deficits / Details: Chronic shoulder  AROM limitations. Functional grip strength   Lower Extremity Assessment Lower Extremity Assessment: Generalized weakness (pain in R grion, grossly 3/5 R LE and 3+/5 LLE, B distal abn sensation due to peripherial neuropathy)      Communication Communication Communication: No difficulties    Cognition Arousal/Alertness: Awake/alert Behavior During Therapy: WFL for tasks assessed/performed Overall Cognitive Status: Within Functional Limits for tasks assessed        General Comments: Oriented x4, able to follow commands without difficulty            Home Living Family/patient expects to be discharged to:: Private residence Living Arrangements: Spouse/significant other  and daughter Available Help at Discharge: Family Type of Home: House Home Access: Ramped entrance      Home Layout: One level     Bathroom Shower/Tub: Walk-in shower         Home Equipment: Pharmacist, hospital (2 wheels);Wheelchair - power;Cane - single point          Prior Functioning/Environment Prior Level of Function : Needs assist       Physical Assist : Mobility (physical);ADLs (physical) Mobility (physical): Bed mobility ADLs (physical): Dressing;Toileting;IADLs;Bathing;Grooming (pt reports she is unable to put her hair in a pony tail. husband assist as able has bad back and daughter medically complex) Mobility Comments: Ambulated short household distances with cane, otherwise used a power wheelchair; she doesn't get out of the house much. Quick fatigue. ADLs Comments: Pt reports having grocery delivered, she doesn't drive, and they have a lot of difficulty managing household cleaning.  She has required assist from her spouse for lower body dressing and toileting over the past month, given onset of symptoms and R groin pain.        OT Problem List: Decreased strength;Decreased activity tolerance;Impaired balance (sitting and/or standing);Cardiopulmonary status limiting activity;Pain      OT Treatment/Interventions: Self-care/ADL training;Therapeutic exercise;Energy conservation;DME and/or AE instruction;Therapeutic activities;Patient/family education;Balance training    OT Goals(Current goals can be found in the care plan section) Acute Rehab OT Goals Patient Stated Goal: to  be as active as possible OT Goal Formulation: With patient Time For Goal Achievement: 01/12/23 Potential to Achieve Goals: Good ADL Goals Pt Will Perform Grooming: with supervision;standing Pt Will Perform Lower Body Dressing: with supervision;sit to/from stand Pt Will Transfer to Toilet: with supervision;ambulating Pt Will Perform Toileting - Clothing Manipulation and hygiene: with supervision;sit to/from stand  OT Frequency: Min 1X/week    Co-evaluation PT/OT/SLP Co-Evaluation/Treatment: Yes Reason for Co-Treatment: Complexity of the patient's impairments (multi-system involvement);To address functional/ADL transfers PT goals addressed during session: Mobility/safety with mobility;Balance;Proper use of DME OT goals addressed during session: ADL's and self-care;Strengthening/ROM      AM-PAC OT "6 Clicks" Daily Activity     Outcome Measure Help from another person eating meals?: None Help from another person taking care of personal grooming?: None Help from another person toileting, which includes using toliet, bedpan, or urinal?: A Little Help from another person bathing (including washing, rinsing, drying)?: A Little Help from another person to put on and taking off regular upper body clothing?: A Little Help from another person to put on and taking off regular lower body clothing?: A Lot 6 Click Score: 19   End of Session Equipment Utilized During Treatment: Gait belt;Rolling walker (2 wheels) Nurse Communication: Mobility status  Activity Tolerance: Patient limited by fatigue Patient left: in chair;with call bell/phone within reach;with chair alarm set  OT Visit Diagnosis: Pain;Muscle weakness (generalized) (M62.81);Unsteadiness on feet (R26.81)  Time: 1610-9604 OT Time Calculation (min): 22 min Charges:  OT General Charges $OT Visit: 1 Visit OT Evaluation $OT Eval Low Complexity: 1 Low    Deniqua Perry L Ylianna Almanzar, OTR/L 12/29/2022, 5:09 PM

## 2022-12-29 NOTE — Plan of Care (Signed)

## 2022-12-30 DIAGNOSIS — L03115 Cellulitis of right lower limb: Secondary | ICD-10-CM | POA: Diagnosis not present

## 2022-12-30 LAB — BASIC METABOLIC PANEL
Anion gap: 10 (ref 5–15)
BUN: 27 mg/dL — ABNORMAL HIGH (ref 8–23)
CO2: 24 mmol/L (ref 22–32)
Calcium: 8.4 mg/dL — ABNORMAL LOW (ref 8.9–10.3)
Chloride: 100 mmol/L (ref 98–111)
Creatinine, Ser: 1.38 mg/dL — ABNORMAL HIGH (ref 0.44–1.00)
GFR, Estimated: 39 mL/min — ABNORMAL LOW (ref >60)
Glucose, Bld: 169 mg/dL — ABNORMAL HIGH (ref 70–99)
Potassium: 4.4 mmol/L (ref 3.5–5.1)
Sodium: 134 mmol/L — ABNORMAL LOW (ref 135–145)

## 2022-12-30 LAB — CBC WITH DIFFERENTIAL/PLATELET
Abs Immature Granulocytes: 0.02 10*3/uL (ref 0.00–0.07)
Basophils Absolute: 0 10*3/uL (ref 0.0–0.1)
Basophils Relative: 0 %
Eosinophils Absolute: 0.2 10*3/uL (ref 0.0–0.5)
Eosinophils Relative: 2 %
HCT: 40.6 % (ref 36.0–46.0)
Hemoglobin: 12.1 g/dL (ref 12.0–15.0)
Immature Granulocytes: 0 %
Lymphocytes Relative: 25 %
Lymphs Abs: 2.4 10*3/uL (ref 0.7–4.0)
MCH: 26.6 pg (ref 26.0–34.0)
MCHC: 29.8 g/dL — ABNORMAL LOW (ref 30.0–36.0)
MCV: 89.2 fL (ref 80.0–100.0)
Monocytes Absolute: 0.8 10*3/uL (ref 0.1–1.0)
Monocytes Relative: 8 %
Neutro Abs: 6 10*3/uL (ref 1.7–7.7)
Neutrophils Relative %: 65 %
Platelets: 242 10*3/uL (ref 150–400)
RBC: 4.55 MIL/uL (ref 3.87–5.11)
RDW: 15.5 % (ref 11.5–15.5)
WBC: 9.4 10*3/uL (ref 4.0–10.5)
nRBC: 0 % (ref 0.0–0.2)

## 2022-12-30 LAB — ECHOCARDIOGRAM COMPLETE
AR max vel: 2.55 cm2
AV Area VTI: 2.51 cm2
AV Area mean vel: 2.48 cm2
AV Mean grad: 6.5 mmHg
AV Peak grad: 11.1 mmHg
Ao pk vel: 1.67 m/s
Area-P 1/2: 4.8 cm2
Calc EF: 58.7 %
Height: 63 in
MV VTI: 2.31 cm2
S' Lateral: 3.4 cm
Single Plane A2C EF: 59.3 %
Single Plane A4C EF: 58.6 %

## 2022-12-30 LAB — GLUCOSE, CAPILLARY
Glucose-Capillary: 161 mg/dL — ABNORMAL HIGH (ref 70–99)
Glucose-Capillary: 207 mg/dL — ABNORMAL HIGH (ref 70–99)

## 2022-12-30 MED ORDER — FUROSEMIDE 40 MG PO TABS: 40.0000 mg | ORAL_TABLET | Freq: Every day | ORAL | 0 refills | Status: DC

## 2022-12-30 MED ORDER — DOXYCYCLINE MONOHYDRATE 100 MG PO TABS: 100.0000 mg | ORAL_TABLET | Freq: Two times a day (BID) | ORAL | 0 refills | Status: AC

## 2022-12-30 MED ORDER — ZINC OXIDE 40 % EX OINT: TOPICAL_OINTMENT | Freq: Two times a day (BID) | CUTANEOUS | 0 refills | Status: DC

## 2022-12-30 MED ORDER — MOMETASONE FURO-FORMOTEROL FUM 100-5 MCG/ACT IN AERO: 2.0000 | INHALATION_SPRAY | Freq: Two times a day (BID) | RESPIRATORY_TRACT | 0 refills | Status: DC

## 2022-12-30 MED ORDER — LISINOPRIL 10 MG PO TABS: 10.0000 mg | ORAL_TABLET | Freq: Every day | ORAL | 0 refills | Status: DC

## 2022-12-30 NOTE — Progress Notes (Signed)
Physical Therapy Treatment Patient Details Name: Holly Spence MRN: [id number] DOB: October 15, 1945 Today's Date: 12/30/2022   History of Present Illness 77 yo female presented to ED 12/28/2022 due SOB, leg pain and B LE edema, R LE wound weeping with skin tear and blister with chronic L LE wound. Pt admitted with R LE cellulitis. Pt is pending B LE doppler. Pt presented to [facility] on 6/19 and one dose of IV ABX and returned home as well as 2 visits to PCP. Pt reports she has not been taking her Lasix. Pt PMH includes but is not limited to DM II, HTN, HDL, HTN, OSA, PVD, s/p pacemaker placement, GERD, COPD, PAF, RLS and LBP.    PT Comments    Pt reports new onset of right hip pain after sneezing this morning, however RN aware and had provided pain medication before therapist arrival.  Pt agreeable to ambulate as she is hopeful for d/c home soon.  Pt able to ambulate around bed over to recliner however limited by UE fatigue and right hip pain.  Pt reports she still feels she could manage at home if discharged today.    Recommendations for follow up therapy are one component of a multi-disciplinary discharge planning process, led by the attending physician.  Recommendations may be updated based on patient status, additional functional criteria and insurance authorization.  Follow Up Recommendations       Assistance Recommended at Discharge Intermittent Supervision/Assistance  Patient can return home with the following A little help with walking and/or transfers;A lot of help with bathing/dressing/bathroom;Assistance with cooking/housework;Assist for transportation;Help with stairs or ramp for entrance   Equipment Recommendations  None recommended by PT    Recommendations for Other Services       Precautions / Restrictions Precautions Precautions: Fall     Mobility  Bed Mobility               General bed mobility comments: pt sitting EOB on arrival    Transfers Overall  transfer level: Needs assistance Equipment used: Rolling walker (2 wheels) Transfers: Sit to/from Stand Sit to Stand: Min guard           General transfer comment: increased time and effort, light rocking technique utilized by pt    Ambulation/Gait Ambulation/Gait assistance: Land (Feet): 12 Feet Assistive device: Rolling walker (2 wheels) Gait Pattern/deviations: Step-to pattern, Wide base of support Gait velocity: decreased     General Gait Details: cues for proper posture and distance from RW (tends to keep RW too far forward); ambulated around bed over to recliner   Stairs             Wheelchair Mobility    Modified Rankin (Stroke Patients Only)       Balance                                            Cognition Arousal/Alertness: Awake/alert Behavior During Therapy: WFL for tasks assessed/performed Overall Cognitive Status: Within Functional Limits for tasks assessed                                          Exercises      General Comments        Pertinent Vitals/Pain Pain Assessment Pain Assessment:  0-10 Pain Score: 8  Pain Location: R hip Pain Descriptors / Indicators: Sore Pain Intervention(s): Repositioned, Monitored during session, Premedicated before session (pt reports RN aware of R hip pain and provided meds)    Home Living                          Prior Function            PT Goals (current goals can now be found in the care plan section) Progress towards PT goals: Progressing toward goals    Frequency    Min 1X/week      PT Plan Current plan remains appropriate    Co-evaluation              AM-PAC PT "6 Clicks" Mobility   Outcome Measure  Help needed turning from your back to your side while in a flat bed without using bedrails?: A Little Help needed moving from lying on your back to sitting on the side of a flat bed without using bedrails?: A  Little Help needed moving to and from a bed to a chair (including a wheelchair)?: A Little Help needed standing up from a chair using your arms (e.g., wheelchair or bedside chair)?: A Little Help needed to walk in hospital room?: A Little Help needed climbing 3-5 steps with a railing? : A Lot 6 Click Score: 17    End of Session   Activity Tolerance: Patient limited by fatigue;Patient limited by pain Patient left: in chair;with call bell/phone within reach   PT Visit Diagnosis: Other abnormalities of gait and mobility (R26.89)     Time: 4098-1191 PT Time Calculation (min) (ACUTE ONLY): 12 min  Charges:  $Gait Training: 8-22 mins                    Paulino Door, DPT Physical Therapist Acute Rehabilitation Services Office: [phone]    Janan Halter [place] 12/30/2022, 1:39 PM

## 2022-12-30 NOTE — Discharge Summary (Signed)
Physician Discharge Summary   Patient: Holly Spence MRN: [id number] DOB: [birth year]  Admit date:     12/28/2022  Discharge date: 12/30/22  Discharge Physician: Briant Cedar   PCP: [facility]   Recommendations at discharge:   Follow-up with PCP in 1 week Follow-up in wound clinic-ambulatory referral sent  Discharge Diagnoses: Principal Problem:   Cellulitis of right leg Active Problems:   Diabetes mellitus due to underlying condition with unspecified complications (HCC)   Benign essential hypertension   Complete heart block (HCC)   Sleep apnea    Hospital Course: Holly Spence is a 77 y.o. female with medical history significant of diabetes mellitus type 2, hyperlipidemia, hypertension, obstructive sleep apnea, PVD, status post pacemaker, GERD, COPD, morbid obesity presents to the ED complaining of bilateral lower extremity swelling, erythema and noted wounds especially on the right lower extremity.  Patient admitted for further management.    Today, patient denies any new complaints.  Bilateral lower extremity wrapped in Unna boots.  Home health PT/OT/RN/aide arranged for patient.  Ambulatory referral sent to wound clinic.    Assessment and Plan: No notes have been filed under this hospital service. Service: Hospitalist  Likely venous stasis dermatitis BLE Possible superimposed cellulitis of right lower extremity Currently afebrile, with no leukocytosis Lactic acid WNL BC x 2 NGTD Bilateral lower extremity Doppler negative for DVT, although technically difficult study Procalcitonin negative S/P ceftriaxone, vancomycin, discharged on p.o. doxycycline to complete 7 days Continue PTA Lasix WOC consulted, appreciate recs Elevation, may need Unna boots Wound care clinic referral sent   Possible chronic diastolic HF Pulmonary hypertension Reports dyspnea, bilateral lower extremity edema BNP 180, although could be falsely lower in obese  patients Troponin WNL, EKG ventricular paced rhythm Chest x-ray unremarkable although body habitus reduces diagnostic sensitivity and specificity Echo showed EF of 55 to 60%, no regional wall motion abnormality, left ventricular diastolic parameters are indeterminate, moderately elevated pulmonary artery systolic pressure of about 47.7 mmHg Continue Lasix Cardiology/pulmonology follow-up   History of COPD Currently on room air Continue inhaler, DuoNebs   Possible stage IIIb CKD Baseline creatinine around 1.2-1.3   Diabetes mellitus type 2 A1c 7.7 Continue home regimen   Paroxysmal A-fib Rate controlled Continue metoprolol, Eliquis   Hypertension BP stable Switched PTA lisinopril/HCTZ to lisinopril only due to CKD stage IIIb  PCP to adjust accordingly   Complete heart block status post pacemaker   GERD Continue PPI   Restless leg syndrome Continue ropinirole   Morbid obesity S/p gastric banding which failed Lifestyle modification advised       Consultants: None Procedures performed: None Disposition: Home health Diet recommendation:  Cardiac and Carb modified diet   DISCHARGE MEDICATION: Allergies as of 12/30/2022       Reactions   Penicillins Hives, Other (See Comments)   At injection site only- hives   Sulfa Antibiotics Hives   Erythromycin Other (See Comments)   Unsure    Azithromycin Rash, Other (See Comments)   Broke out the legs   Ciprofloxacin Hcl Other (See Comments)   Abdominal Pain        Medication List     STOP taking these medications    Breztri Aerosphere 160-9-4.8 MCG/ACT Aero Generic drug: Budeson-Glycopyrrol-Formoterol   clindamycin 300 MG capsule Commonly known as: CLEOCIN   lisinopril-hydrochlorothiazide 20-12.5 MG tablet Commonly known as: ZESTORETIC       TAKE these medications    albuterol 108 (90 Base) MCG/ACT inhaler Commonly known as:  VENTOLIN HFA Inhale 2 puffs into the lungs every 6 (six) hours as needed  for wheezing or shortness of breath.   apixaban 5 MG Tabs tablet Commonly known as: Eliquis Take 1 tablet (5 mg total) by mouth 2 (two) times daily.   cyanocobalamin 1000 MCG tablet Take 1,000 mcg by mouth daily.   diclofenac sodium 1 % Gel Commonly known as: VOLTAREN Apply 2-4 g topically every 6 (six) hours as needed (pain).   doxycycline 100 MG tablet Commonly known as: ADOXA Take 1 tablet (100 mg total) by mouth 2 (two) times daily for 4 days. Start taking on: December 31, 2022   furosemide 40 MG tablet Commonly known as: LASIX Take 1 tablet (40 mg total) by mouth daily. What changed:  when to take this reasons to take this   gabapentin 300 MG capsule Commonly known as: NEURONTIN Take 300 mg by mouth 3 (three) times daily.   insulin NPH-regular Human (70-30) 100 UNIT/ML injection Inject 55-90 Units into the skin See admin instructions. Inject 55-100 units into the skin before breakfast and supper per sliding scale   lisinopril 10 MG tablet Commonly known as: ZESTRIL Take 1 tablet (10 mg total) by mouth daily.   liver oil-zinc oxide 40 % ointment Commonly known as: DESITIN Apply topically 2 (two) times daily.   metoprolol succinate 25 MG 24 hr tablet Commonly known as: Toprol XL Take 1 tablet (25 mg total) by mouth daily.   mometasone-formoterol 100-5 MCG/ACT Aero Commonly known as: DULERA Inhale 2 puffs into the lungs 2 (two) times daily.   ondansetron 4 MG disintegrating tablet Commonly known as: ZOFRAN-ODT Take 4 mg by mouth every 8 (eight) hours as needed for nausea or vomiting (DISSOLVE ORALLY).   pantoprazole 40 MG tablet Commonly known as: PROTONIX Take 40 mg by mouth daily before breakfast.   rOPINIRole 4 MG tablet Commonly known as: REQUIP Take 4 mg by mouth at bedtime.   rosuvastatin 10 MG tablet Commonly known as: CRESTOR Take 10 mg by mouth at bedtime.   traMADol 50 MG tablet Commonly known as: ULTRAM Take 100 mg by mouth in the morning and  at bedtime.        Follow-up Information     [facility] Schedule an appointment as soon as possible for a visit in 1 week(s).   Contact information: [address] [place] [address] [phone]         [facility] Follow up.   Specialty: Home Health Services Why: to provide home health nursing and therapy visits Contact information: [address] [place] [address] [phone]                Discharge Exam: Ceasar Mons Weights   12/28/22 1342  Weight: (!) 147.4 kg   General: NAD, morbidly obese Cardiovascular: S1, S2 present Respiratory: Diminished breath sounds bilaterally Abdomen: Soft, nontender, nondistended, bowel sounds present Musculoskeletal: bilateral pedal edema noted, wrapped in Unna boots Skin: Noted chronic venous dermatitis Psychiatry: Normal mood   Condition at discharge: stable  The results of significant diagnostics from this hospitalization (including imaging, microbiology, ancillary and laboratory) are listed below for reference.   Imaging Studies: ECHOCARDIOGRAM COMPLETE  Result Date: 12/30/2022    ECHOCARDIOGRAM REPORT   Patient Name:   Holly Spence Date of Exam: 12/29/2022 Medical Rec #:  [id number]      Height:       63.0 in Accession #:    [id number]  Weight:       325.0 lb Date of Birth:  Dec 01, 1945      BSA:          2.377 m Patient Age:    77 years       BP:           155/66 mmHg Patient Gender: F              HR:           69 bpm. Exam Location:  Inpatient Procedure: 2D Echo, Cardiac Doppler, Color Doppler and Intracardiac            Opacification Agent Indications:    Dyspnea  History:        Patient has prior history of Echocardiogram examinations, most                 recent 06/11/2020. PAD and COPD, Arrythmias:complete heart block                 and Bradycardia, Signs/Symptoms:Fatigue, Shortness of Breath and                 Edema; Risk Factors:Diabetes, Hypertension, Sleep Apnea,                  cellulitis, Dyslipidemia and Former Smoker.  Sonographer:    Wallie Char Referring Phys: [id number] Monica Martinez [facility]  Sonographer Comments: Image acquisition challenging due to patient body habitus and Image acquisition challenging due to COPD. IMPRESSIONS  1. Left ventricular ejection fraction, by estimation, is 55 to 60%. The left ventricle has normal function. The left ventricle has no regional wall motion abnormalities. There is mild left ventricular hypertrophy. Left ventricular diastolic parameters are indeterminate.  2. Right ventricular systolic function is normal. The right ventricular size is mildly enlarged. There is moderately elevated pulmonary artery systolic pressure. The estimated right ventricular systolic pressure is 47.7 mmHg.  3. The mitral valve is degenerative. Trivial mitral valve regurgitation. No evidence of mitral stenosis. Moderate mitral annular calcification.  4. The aortic valve was not well visualized. Aortic valve regurgitation is not visualized. Aortic valve sclerosis is present, with no evidence of aortic valve stenosis.  5. Aortic dilatation noted. There is mild dilatation of the ascending aorta, measuring 41 mm.  6. The inferior vena cava is dilated in size with <50% respiratory variability, suggesting right atrial pressure of 15 mmHg. FINDINGS  Left Ventricle: Left ventricular ejection fraction, by estimation, is 55 to 60%. The left ventricle has normal function. The left ventricle has no regional wall motion abnormalities. Definity contrast agent was given IV to delineate the left ventricular  endocardial borders. The left ventricular internal cavity size was normal in size. There is mild left ventricular hypertrophy. Left ventricular diastolic parameters are indeterminate. Right Ventricle: The right ventricular size is mildly enlarged. Right vetricular wall thickness was not well visualized. Right ventricular systolic function is normal. There is moderately elevated  pulmonary artery systolic pressure. The tricuspid regurgitant velocity is 2.86 m/s, and with an assumed right atrial pressure of 15 mmHg, the estimated right ventricular systolic pressure is 47.7 mmHg. Left Atrium: Left atrial size was normal in size. Right Atrium: Right atrial size was normal in size. Pericardium: There is no evidence of pericardial effusion. Mitral Valve: The mitral valve is degenerative in appearance. Moderate mitral annular calcification. Trivial mitral valve regurgitation. No evidence of mitral valve stenosis. MV peak gradient, 10.5 mmHg. The mean mitral valve gradient is 4.0 mmHg. Tricuspid Valve: The  tricuspid valve is normal in structure. Tricuspid valve regurgitation is trivial. No evidence of tricuspid stenosis. Aortic Valve: The aortic valve was not well visualized. Aortic valve regurgitation is not visualized. Aortic valve sclerosis is present, with no evidence of aortic valve stenosis. Aortic valve mean gradient measures 6.5 mmHg. Aortic valve peak gradient measures 11.1 mmHg. Aortic valve area, by VTI measures 2.51 cm. Pulmonic Valve: The pulmonic valve was normal in structure. Pulmonic valve regurgitation is not visualized. No evidence of pulmonic stenosis. Aorta: Aortic dilatation noted. There is mild dilatation of the ascending aorta, measuring 41 mm. Venous: The inferior vena cava is dilated in size with less than 50% respiratory variability, suggesting right atrial pressure of 15 mmHg. IAS/Shunts: The interatrial septum was not well visualized. Additional Comments: A device lead is visualized in the right ventricle and right atrium.  LEFT VENTRICLE PLAX 2D LVIDd:         5.00 cm      Diastology LVIDs:         3.40 cm      LV e' medial:    10.60 cm/s LV PW:         1.30 cm      LV E/e' medial:  15.7 LV IVS:        0.90 cm      LV e' lateral:   12.00 cm/s LVOT diam:     2.10 cm      LV E/e' lateral: 13.8 LV SV:         97 LV SV Index:   41 LVOT Area:     3.46 cm  LV Volumes (MOD)  LV vol d, MOD A2C: 150.0 ml LV vol d, MOD A4C: 141.0 ml LV vol s, MOD A2C: 61.0 ml LV vol s, MOD A4C: 58.4 ml LV SV MOD A2C:     89.0 ml LV SV MOD A4C:     141.0 ml LV SV MOD BP:      86.2 ml RIGHT VENTRICLE RV S prime:     15.40 cm/s TAPSE (M-mode): 2.3 cm LEFT ATRIUM             Index        RIGHT ATRIUM           Index LA diam:        4.00 cm 1.68 cm/m   RA Area:     18.50 cm LA Vol (A2C):   67.6 ml 28.44 ml/m  RA Volume:   51.90 ml  21.84 ml/m LA Vol (A4C):   58.5 ml 24.61 ml/m LA Biplane Vol: 65.8 ml 27.68 ml/m  AORTIC VALVE AV Area (Vmax):    2.55 cm AV Area (Vmean):   2.48 cm AV Area (VTI):     2.51 cm AV Vmax:           166.50 cm/s AV Vmean:          122.000 cm/s AV VTI:            0.385 m AV Peak Grad:      11.1 mmHg AV Mean Grad:      6.5 mmHg LVOT Vmax:         122.50 cm/s LVOT Vmean:        87.450 cm/s LVOT VTI:          0.279 m LVOT/AV VTI ratio: 0.72  AORTA Ao Root diam: 3.30 cm Ao Asc diam:  4.10 cm MITRAL VALVE  TRICUSPID VALVE MV Area (PHT): 4.80 cm     TR Peak grad:   32.7 mmHg MV Area VTI:   2.31 cm     TR Vmax:        286.00 cm/s MV Peak grad:  10.5 mmHg MV Mean grad:  4.0 mmHg     SHUNTS MV Vmax:       1.62 m/s     Systemic VTI:  0.28 m MV Vmean:      92.1 cm/s    Systemic Diam: 2.10 cm MV Decel Time: 158 msec MV E velocity: 166.00 cm/s MV A velocity: 89.60 cm/s MV E/A ratio:  1.85 Weston Brass MD Electronically signed by Weston Brass MD Signature Date/Time: 12/30/2022/6:00:40 AM    Final    VAS [place] LOWER EXTREMITY VENOUS (DVT)  Result Date: 12/29/2022  Lower Venous DVT Study Patient Name:  Holly Spence  Date of Exam:   12/29/2022 Medical Rec #: [id number]       Accession #:    [id number] Date of Birth: Feb 16, 1946       Patient Gender: F Patient Age:   74 years Exam Location:  [facility] Procedure:      VAS [place] LOWER EXTREMITY VENOUS (DVT) Referring Phys: [facility] Whitlee Sluder --------------------------------------------------------------------------------   Indications: BLE pain, swelling, erythema, and wounds.  Limitations: Very limited exam due to body habitus (BMI 58), poor ultrasound/ tissue interface. Comparison Study: No previous exams Performing Technologist: Jody Hill RVT, RDMS  Examination Guidelines: A complete evaluation includes B-mode imaging, spectral Doppler, color Doppler, and power Doppler as needed of all accessible portions of each vessel. Bilateral testing is considered an integral part of a complete examination. Limited examinations for reoccurring indications may be performed as noted. The reflux portion of the exam is performed with the patient in reverse Trendelenburg.  +---------+---------------+---------+-----------+----------+-------------------+ RIGHT    CompressibilityPhasicitySpontaneityPropertiesThrombus Aging      +---------+---------------+---------+-----------+----------+-------------------+ CFV      Full           Yes      Yes                                      +---------+---------------+---------+-----------+----------+-------------------+ SFJ      Full                                                             +---------+---------------+---------+-----------+----------+-------------------+ FV Prox  Full           Yes      Yes                                      +---------+---------------+---------+-----------+----------+-------------------+ FV Mid   Full           Yes      Yes                                      +---------+---------------+---------+-----------+----------+-------------------+ FV DistalFull           Yes      Yes                                      +---------+---------------+---------+-----------+----------+-------------------+  PFV      Full                                                             +---------+---------------+---------+-----------+----------+-------------------+ POP      Full           Yes      Yes                                       +---------+---------------+---------+-----------+----------+-------------------+ PTV      Full                                         Not well visualized +---------+---------------+---------+-----------+----------+-------------------+ PERO     Full                                         Not well visualized +---------+---------------+---------+-----------+----------+-------------------+   +---------+---------------+---------+-----------+----------+-------------------+ LEFT     CompressibilityPhasicitySpontaneityPropertiesThrombus Aging      +---------+---------------+---------+-----------+----------+-------------------+ CFV      Full           Yes      Yes                                      +---------+---------------+---------+-----------+----------+-------------------+ SFJ      Full                                                             +---------+---------------+---------+-----------+----------+-------------------+ FV Prox  Full           Yes      Yes                                      +---------+---------------+---------+-----------+----------+-------------------+ FV Mid                                                Not visualized      +---------+---------------+---------+-----------+----------+-------------------+ FV DistalFull           Yes      Yes                                      +---------+---------------+---------+-----------+----------+-------------------+ PFV  Not visualized      +---------+---------------+---------+-----------+----------+-------------------+ POP      Full           Yes      Yes                                      +---------+---------------+---------+-----------+----------+-------------------+ PTV      Full                                         Not well visualized +---------+---------------+---------+-----------+----------+-------------------+  PERO     Full                                         Not well visualized +---------+---------------+---------+-----------+----------+-------------------+   Left Technical Findings: Not visualized segments include PFV, MID FV.   Summary: BILATERAL: -No evidence of popliteal cyst, bilaterally. -Diffuse subcutaneous edema, bilaterally.  RIGHT: - There is no evidence of deep vein thrombosis in the lower extremity. However, portions of this examination were limited- see technologist comments above.  LEFT: - There is no evidence of deep vein thrombosis in the lower extremity. However, portions of this examination were limited- see technologist comments above.  *See table(s) above for measurements and observations. Electronically signed by Waverly Ferrari MD on 12/29/2022 at 5:05:23 PM.    Final    DG CHEST PORT 1 VIEW  Result Date: 12/28/2022 CLINICAL DATA:  Shortness of breath EXAM: PORTABLE CHEST 1 VIEW COMPARISON:  07/15/2022 FINDINGS: Body habitus reduces diagnostic sensitivity and specificity. Dual lead pacer remains in place. The patient is rotated to the left on today's radiograph, reducing diagnostic sensitivity and specificity. Borderline enlargement of the cardiopericardial silhouette. The lungs appear clear. No blunting of the costophrenic angles. Thoracic spondylosis is noted. IMPRESSION: 1. Borderline enlargement of the cardiopericardial silhouette. Dual lead pacer noted. 2. Thoracic spondylosis. Electronically Signed   By: Gaylyn Rong M.D.   On: 12/28/2022 17:59   CUP PACEART REMOTE DEVICE CHECK  Result Date: 12/11/2022 Scheduled remote reviewed. Normal device function.  AS<2:1, stable trend Hx of PAF, controlled rates, burden 4.1%, Eliquis per PA report Next remote 91 days. LA, CVRS   Microbiology: Results for orders placed or performed during the hospital encounter of 12/28/22  Blood culture (routine x 2)     Status: None (Preliminary result)   Collection Time: 12/28/22  5:18  PM   Specimen: BLOOD  Result Value Ref Range Status   Specimen Description   Final    BLOOD BLOOD LEFT WRIST Performed at [facility], [address], [place], [place] [address]    Special Requests   Final    BOTTLES DRAWN AEROBIC AND ANAEROBIC Blood Culture results may not be optimal due to an excessive volume of blood received in culture bottles Performed at [facility], [address], [place], [place] [address]    Culture   Final    NO GROWTH 2 DAYS Performed at [facility], [address], [place], [place] [address]    Report Status PENDING  Incomplete  Blood culture (routine x 2)     Status: None (Preliminary result)   Collection Time: 12/28/22  5:28 PM   Specimen: BLOOD  Result  Value Ref Range Status   Specimen Description   Final    BLOOD BLOOD RIGHT FOREARM Performed at [facility], [address] [place], [place] [address]    Special Requests   Final    BOTTLES DRAWN AEROBIC AND ANAEROBIC Blood Culture adequate volume Performed at [facility], [address] [place], [place] [address]    Culture   Final    NO GROWTH 2 DAYS Performed at [facility], [address], [place], [place] [address]    Report Status PENDING  Incomplete    Labs: CBC: Recent Labs  Lab 12/28/22 1551 12/29/22 0627 12/30/22 0741  WBC 8.6 8.0 9.4  NEUTROABS 6.2  --  6.0  HGB 11.6* 10.5* 12.1  HCT 39.7 35.7* 40.6  MCV 89.6 89.9 89.2  PLT 219 199 242   Basic Metabolic Panel: Recent Labs  Lab 12/28/22 1551 12/29/22 0627 12/30/22 0741  NA 138 137 134*  K 4.2 4.3 4.4  CL 106 102 100  CO2 27 27 24   GLUCOSE 168* 180* 169*  BUN 29* 27* 27*  CREATININE 1.28* 1.26* 1.38*  CALCIUM 8.4* 8.2* 8.4*   Liver Function Tests: Recent Labs  Lab 12/28/22 1551  AST 12*  ALT 12  ALKPHOS 70  BILITOT 0.6  PROT 6.5  ALBUMIN 3.2*   CBG: Recent Labs  Lab 12/29/22 1148 12/29/22 1654  12/29/22 2105 12/30/22 0740 12/30/22 1153  GLUCAP 165* 249* 195* 161* 207*    Discharge time spent: greater than 30 minutes.  Signed: Briant Cedar, MD [facility] 12/30/2022

## 2022-12-30 NOTE — TOC Transition Note (Signed)
Transition of Care [facility]) - CM/SW Discharge Note   Patient Details  Name: Holly Spence MRN: [id number] Date of Birth: [birth year]  Transition of Care [facility]) CM/SW Contact:  Amada Jupiter, LCSW Phone Number: 12/30/2022, 4:06 PM   Clinical Narrative:     Pt anticipating to dc home today.  HH orders placed for RN/PT/OT/aide and pt without agency preference.  Able to secure coverage with [facility], however, visits will be delayed until mid next week.  Have alerted pt and MD of delay.  No further TOC needs.  Final next level of care: Home/Self Care Barriers to Discharge: Barriers Resolved   Patient Goals and CMS Choice      Discharge Placement                         Discharge Plan and Services Additional resources added to the After Visit Summary for                  DME Arranged: N/A DME Agency: NA       HH Arranged: RN, PT, OT, Nurse's Aide HH Agency: [facility] Date [facility] Agency Contacted: 12/30/22 Time HH Agency Contacted: 1605 Representative spoke with at [facility] Agency: [place]el Neth  Social Determinants of Health (SDOH) Interventions SDOH Screenings   Food Insecurity: No Food Insecurity (12/28/2022)  Housing: Low Risk  (12/28/2022)  Transportation Needs: No Transportation Needs (12/28/2022)  Utilities: Not At Risk (12/28/2022)  Depression (PHQ2-9): Low Risk  (02/03/2019)  Tobacco Use: Medium Risk (12/28/2022)     Readmission Risk Interventions    12/30/2022    3:47 PM  Readmission Risk Prevention Plan  Transportation Screening Complete  PCP or Specialist Appt within 5-7 Days Complete  Home Care Screening Complete  Medication Review (RN CM) Complete

## 2023-01-02 ENCOUNTER — Telehealth: Payer: Self-pay

## 2023-01-02 LAB — CULTURE, BLOOD (ROUTINE X 2)
Culture: NO GROWTH
Culture: NO GROWTH
Special Requests: ADEQUATE

## 2023-01-02 NOTE — Telephone Encounter (Signed)
Patient returned call. Noted AF clinic was attempting to contact patient in regard to apt.

## 2023-01-02 NOTE — Telephone Encounter (Signed)
Pt LMOVM returning nurse call. 

## 2023-01-08 ENCOUNTER — Encounter (HOSPITAL_COMMUNITY): Payer: Self-pay | Admitting: Internal Medicine

## 2023-01-08 ENCOUNTER — Ambulatory Visit (HOSPITAL_COMMUNITY)
Admission: RE | Admit: 2023-01-08 | Discharge: 2023-01-08 | Disposition: A | Discharge status: Home / Self Care | Payer: Medicare Other | Source: Ambulatory Visit | Attending: Internal Medicine | Admitting: Internal Medicine

## 2023-01-08 VITALS — BP 122/68 | HR 83 | Ht 63.0 in | Wt 325.7 lb

## 2023-01-08 DIAGNOSIS — I4891 Unspecified atrial fibrillation: Secondary | ICD-10-CM | POA: Insufficient documentation

## 2023-01-08 DIAGNOSIS — E119 Type 2 diabetes mellitus without complications: Secondary | ICD-10-CM | POA: Diagnosis not present

## 2023-01-08 DIAGNOSIS — I2510 Atherosclerotic heart disease of native coronary artery without angina pectoris: Secondary | ICD-10-CM | POA: Insufficient documentation

## 2023-01-08 DIAGNOSIS — Z79899 Other long term (current) drug therapy: Secondary | ICD-10-CM | POA: Diagnosis not present

## 2023-01-08 DIAGNOSIS — I4819 Other persistent atrial fibrillation: Secondary | ICD-10-CM | POA: Insufficient documentation

## 2023-01-08 DIAGNOSIS — Z7901 Long term (current) use of anticoagulants: Secondary | ICD-10-CM | POA: Diagnosis not present

## 2023-01-08 DIAGNOSIS — Z794 Long term (current) use of insulin: Secondary | ICD-10-CM | POA: Insufficient documentation

## 2023-01-08 DIAGNOSIS — Z9884 Bariatric surgery status: Secondary | ICD-10-CM | POA: Diagnosis not present

## 2023-01-08 DIAGNOSIS — E669 Obesity, unspecified: Secondary | ICD-10-CM | POA: Insufficient documentation

## 2023-01-08 DIAGNOSIS — D6869 Other thrombophilia: Secondary | ICD-10-CM

## 2023-01-08 DIAGNOSIS — I119 Hypertensive heart disease without heart failure: Secondary | ICD-10-CM | POA: Insufficient documentation

## 2023-01-08 DIAGNOSIS — I4892 Unspecified atrial flutter: Secondary | ICD-10-CM | POA: Insufficient documentation

## 2023-01-08 DIAGNOSIS — I700 Atherosclerosis of aorta: Secondary | ICD-10-CM | POA: Insufficient documentation

## 2023-01-08 HISTORY — DX: Unspecified atrial fibrillation: I48.91

## 2023-01-08 HISTORY — DX: Other thrombophilia: D68.69

## 2023-01-08 MED ORDER — LISINOPRIL 20 MG PO TABS: 20.0000 mg | ORAL_TABLET | Freq: Every day | ORAL | 5 refills | Status: DC

## 2023-01-08 NOTE — Addendum Note (Signed)
Encounter addended by: Shona Simpson, RN on: 01/08/2023 4:12 PM  Actions taken: Patient-reported medication modified, Order list changed

## 2023-01-08 NOTE — Progress Notes (Signed)
Remote pacemaker transmission.   

## 2023-01-08 NOTE — Progress Notes (Signed)
Primary Care Physician: [facility] Referring Physician: Dr. Delila Pereyra is a 77 y.o. female with a h/o morbid obesity, aortic atherosclerosis and coronary artery calcifications on chest CT 2021, HTN, PPM, DM, that was referred by the device clinic for new onset afib since 12/27/20. Raynelle Fanning today shows atrial  flutter with v rates controlled. She has been aware of a flutter in her chest for a while. She is basically w/c bound 2/2 to obesity and trouble ambulating. She had gastric sleeve in the past and lost down to 250 now at 300 lbs, heaviest weight was 350 lbs. She has not on anticoagulation. She states a bleeding ulcer a couple of years past but it was felt  to  be 2/2 Mobic. Pt is no longer taking and has not has any further bleeding. CHA2DS2VASc score is 6.   F/u in afib clinic, 01/12/21. She remains in afib, rate controlled. She has missed several of her Eliquis tabs but her daughter has set an alarm on pt's phone to alert her. We discussed a cardioversion again but she states that she does not feel the  afib but at this point she does not feel driven to undertake this.    F/u in afib clinic, 01/27/21. Pt returns still in afib, v paced and rate controlled.  She describes that she had some PND, orthopnea. Weight is up 5 lbs today. She does take lasix prn, she has not taken this week. On last visit, she voiced concern that she did not want to undergo cardioversion but with her latest symptoms, she will proceed with same. She has not missed any anticoagulation since before 7/6. She has an alarm set on her phone now to remind her.    On follow up, 01/08/23. Device alert on 6/13 for ongoing Afib since 6/4; Afib burden 5.2% with good ventricular rate control. No missed doses of Eliquis. She is not sure if she can feel when she is in Afib.  Today, she denies symptoms of palpitations, chest pain, shortness of breath, orthopnea, PND, lower extremity edema, dizziness, presyncope,  syncope, or neurologic sequela. The patient is tolerating medications without difficulties and is otherwise without complaint today.   Past Medical History:  Diagnosis Date   Allergic rhinitis 09/20/2015   Arthritis    Ascending aorta dilation (HCC) 12/09/2018   Back pain    Benign essential hypertension 09/20/2015   Bruises easily    Complete heart block (HCC) 06/11/2020   Contact lens/glasses fitting    Cough    Depression 02/08/2019   Diabetes mellitus due to underlying condition with unspecified complications (HCC) 11/10/2015   Duodenal ulcer 02/08/2019   Fatigue    Gastroesophageal reflux disease 09/20/2015   GI bleeding 06/27/2019   Hx of laparoscopic gastric banding 01/31/2011   Surgery: 03/22/10    Hyperlipidemia    Insulin long-term use (HCC) 09/20/2015   Melena 02/08/2019   Moderate asthma 02/08/2019   Palpitations 11/10/2015   Poor circulation    Right renal mass 02/08/2019   Sinus problem    Sleep apnea    SOB (shortness of breath)    Symptomatic bradycardia 06/11/2020   Uncontrolled type 2 diabetes mellitus without complication, with long-term current use of insulin 09/20/2015   Past Surgical History:  Procedure Laterality Date   CATARACT EXTRACTION     confirm date with patient   CHOLECYSTECTOMY  1987   EYE SURGERY  2003   retina   HERNIA REPAIR  LAPAROSCOPIC GASTRIC BANDING  03/22/10   PACEMAKER IMPLANT N/A 06/11/2020   Procedure: PACEMAKER IMPLANT;  Surgeon: Lanier Prude, MD;  Location: [facility];  Service: Cardiovascular;  Laterality: N/A;    Current Outpatient Medications  Medication Sig Dispense Refill   albuterol (VENTOLIN HFA) 108 (90 Base) MCG/ACT inhaler Inhale 2 puffs into the lungs every 6 (six) hours as needed for wheezing or shortness of breath.      apixaban (ELIQUIS) 5 MG TABS tablet Take 1 tablet (5 mg total) by mouth 2 (two) times daily. 60 tablet 6   cyanocobalamin 1000 MCG tablet Take 1,000 mcg by mouth daily.     furosemide (LASIX) 40 MG  tablet Take 1 tablet (40 mg total) by mouth daily. 30 tablet 0   gabapentin (NEURONTIN) 300 MG capsule Take 300 mg by mouth 3 (three) times daily.     insulin NPH-regular Human (70-30) 100 UNIT/ML injection Inject 55-90 Units into the skin See admin instructions. Inject 55-100 units into the skin before breakfast and supper per sliding scale     lisinopril-hydrochlorothiazide (ZESTORETIC) 20-12.5 MG tablet Take 1 tablet by mouth daily.     metoprolol succinate (TOPROL XL) 25 MG 24 hr tablet Take 1 tablet (25 mg total) by mouth daily. 90 tablet 3   mometasone-formoterol (DULERA) 100-5 MCG/ACT AERO Inhale 2 puffs into the lungs 2 (two) times daily. 1 each 0   ondansetron (ZOFRAN-ODT) 4 MG disintegrating tablet Take 4 mg by mouth every 8 (eight) hours as needed for nausea or vomiting (DISSOLVE ORALLY).     pantoprazole (PROTONIX) 40 MG tablet Take 40 mg by mouth daily before breakfast.     rOPINIRole (REQUIP) 4 MG tablet Take 4 mg by mouth at bedtime.      rosuvastatin (CRESTOR) 10 MG tablet Take 10 mg by mouth at bedtime.     traMADol (ULTRAM) 50 MG tablet Take 100 mg by mouth in the morning and at bedtime.     diclofenac sodium (VOLTAREN) 1 % GEL Apply 2-4 g topically every 6 (six) hours as needed (pain). (Patient not taking: Reported on 01/08/2023)     liver oil-zinc oxide (DESITIN) 40 % ointment Apply topically 2 (two) times daily. (Patient not taking: Reported on 01/08/2023) 56.7 g 0   No current facility-administered medications for this encounter.    Allergies  Allergen Reactions   Penicillins Hives and Other (See Comments)    At injection site only- hives   Sulfa Antibiotics Hives   Erythromycin Other (See Comments)    Unsure    Azithromycin Rash and Other (See Comments)    Broke out the legs   Ciprofloxacin Hcl Other (See Comments)    Abdominal Pain   ROS- All systems are reviewed and negative except as per the HPI above  Physical Exam: Vitals:   01/08/23 1321  BP: 122/68  Pulse:  83  Weight: (!) 147.7 kg  Height: 5\' 3"  (1.6 m)    Wt Readings from Last 3 Encounters:  01/08/23 (!) 147.7 kg  12/28/22 (!) 147.4 kg  08/29/22 (!) 138.3 kg    Labs: Lab Results  Component Value Date   NA 134 (L) 12/30/2022   K 4.4 12/30/2022   CL 100 12/30/2022   CO2 24 12/30/2022   GLUCOSE 169 (H) 12/30/2022   BUN 27 (H) 12/30/2022   CREATININE 1.38 (H) 12/30/2022   CALCIUM 8.4 (L) 12/30/2022   Lab Results  Component Value Date   INR 1.0 07/15/2022  No results found for: "CHOL", "HDL", "LDLCALC", "TRIG"  GEN- The patient is well appearing, alert and oriented x 3 today. Morbidly obese habitus. Head- normocephalic, atraumatic Eyes-  Sclera clear, conjunctiva pink Ears- hearing intact Lungs- Clear to ausculation bilaterally, normal work of breathing Heart- Regular rate and rhythm, no murmurs, rubs or gallops, PMI not laterally displaced Extremities- no clubbing, cyanosis, or edema MS- no significant deformity or atrophy Skin- no rash or lesion Psych- euthymic mood, full affect Neuro- strength and sensation are intact  EKG- V-paced rhythm HR 83 PR * ms QRS 180 ms QT/Qtc 452/531 ms  Echo- 12/30/22:  1. Left ventricular ejection fraction, by estimation, is 55 to 60%. The  left ventricle has normal function. The left ventricle has no regional  wall motion abnormalities. There is mild left ventricular hypertrophy.  Left ventricular diastolic parameters  are indeterminate.   2. Right ventricular systolic function is normal. The right ventricular  size is mildly enlarged. There is moderately elevated pulmonary artery  systolic pressure. The estimated right ventricular systolic pressure is  47.7 mmHg.   3. The mitral valve is degenerative. Trivial mitral valve regurgitation.  No evidence of mitral stenosis. Moderate mitral annular calcification.   4. The aortic valve was not well visualized. Aortic valve regurgitation  is not visualized. Aortic valve sclerosis is  present, with no evidence of  aortic valve stenosis.   5. Aortic dilatation noted. There is mild dilatation of the ascending  aorta, measuring 41 mm.   6. The inferior vena cava is dilated in size with <50% respiratory  variability, suggesting right atrial pressure of 15 mmHg.   Assessment and Plan:   1. Afib/ atrial flutter Recent increase in Afib burden via device alert We discussed options including Tikosyn and amiodarone. After discussion, she is considering Tikosyn but will call insurance to determine pricing. Zofran was on her medication list originally but she does not take that regularly. Advised she will have to take lisinopril only and stop hydrochlorothiazide.   2. CHA2DS2VASc score of 6 Continue  eliquis 5 mg bid  3. Obesity Weight loss encouraged  Pt basically is sedentary, w/c bound for most part  No alcohol or tobacco use   4. PPM Per device clinic F/u with remote checks  Patient will call to schedule Tikosyn admission once she had decided.  Lake Bells, PA-C Af[facility] [address] [place], [place] [address] [phone]

## 2023-01-09 ENCOUNTER — Telehealth: Payer: Self-pay | Admitting: Pharmacist

## 2023-01-09 NOTE — Telephone Encounter (Signed)
Medication list reviewed in anticipation of upcoming Tikosyn initiation. Patient is using albuterol and Dulera inhalers which are QTc prolonging but ok to continue. He has both lisinopril and lisinopril-hydrochlorothiazide on his med list currently but looks like the lisinopril is to be started in the future as a replacement for lisinopril hydrochlorothiazide closer to Tikosyn initiation date. Hydrochlorothiazide will need to be discontinued at least 3 days before Tikosyn initiation.  Patient is anticoagulated on Eliquis 5mg  BID on the appropriate dose. Please ensure that patient has not missed any anticoagulation doses in the 3 weeks prior to Tikosyn initiation.   Patient will need to be counseled to avoid use of Benadryl while on Tikosyn and in the 2-3 days prior to Tikosyn initiation.

## 2023-01-18 ENCOUNTER — Other Ambulatory Visit (HOSPITAL_COMMUNITY): Payer: Self-pay

## 2023-01-18 MED ORDER — APIXABAN 5 MG PO TABS: 5.0000 mg | ORAL_TABLET | Freq: Two times a day (BID) | ORAL | 0 refills | Status: DC

## 2023-02-13 ENCOUNTER — Encounter (HOSPITAL_BASED_OUTPATIENT_CLINIC_OR_DEPARTMENT_OTHER): Payer: Medicare Other | Admitting: General Surgery

## 2023-02-16 ENCOUNTER — Encounter (HOSPITAL_COMMUNITY): Payer: Self-pay

## 2023-02-19 ENCOUNTER — Ambulatory Visit (HOSPITAL_COMMUNITY)
Admission: RE | Admit: 2023-02-19 | Discharge: 2023-02-19 | Disposition: A | Discharge status: Home / Self Care | Payer: Medicare Other | Source: Ambulatory Visit | Attending: Internal Medicine | Admitting: Internal Medicine

## 2023-02-19 ENCOUNTER — Encounter (HOSPITAL_COMMUNITY): Payer: Self-pay | Admitting: Internal Medicine

## 2023-02-19 ENCOUNTER — Encounter (HOSPITAL_COMMUNITY): Payer: Self-pay | Admitting: Cardiology

## 2023-02-19 ENCOUNTER — Other Ambulatory Visit: Payer: Self-pay

## 2023-02-19 ENCOUNTER — Inpatient Hospital Stay (HOSPITAL_COMMUNITY)
Admission: AD | Admit: 2023-02-19 | Discharge: 2023-02-27 | DRG: 308 | Disposition: A | Discharge status: Home / Self Care | Payer: Medicare Other | Source: Ambulatory Visit | Attending: Cardiology | Admitting: Cardiology

## 2023-02-19 VITALS — BP 118/60 | HR 102 | Ht 63.0 in | Wt 319.4 lb

## 2023-02-19 DIAGNOSIS — Z8616 Personal history of COVID-19: Secondary | ICD-10-CM | POA: Diagnosis not present

## 2023-02-19 DIAGNOSIS — I4819 Other persistent atrial fibrillation: Principal | ICD-10-CM | POA: Diagnosis present

## 2023-02-19 DIAGNOSIS — Z8711 Personal history of peptic ulcer disease: Secondary | ICD-10-CM | POA: Diagnosis not present

## 2023-02-19 DIAGNOSIS — I4891 Unspecified atrial fibrillation: Secondary | ICD-10-CM

## 2023-02-19 DIAGNOSIS — I110 Hypertensive heart disease with heart failure: Secondary | ICD-10-CM | POA: Diagnosis present

## 2023-02-19 DIAGNOSIS — Z794 Long term (current) use of insulin: Secondary | ICD-10-CM | POA: Diagnosis not present

## 2023-02-19 DIAGNOSIS — G4730 Sleep apnea, unspecified: Secondary | ICD-10-CM | POA: Diagnosis present

## 2023-02-19 DIAGNOSIS — I4892 Unspecified atrial flutter: Secondary | ICD-10-CM | POA: Diagnosis present

## 2023-02-19 DIAGNOSIS — Z79899 Other long term (current) drug therapy: Secondary | ICD-10-CM | POA: Insufficient documentation

## 2023-02-19 DIAGNOSIS — Z6841 Body Mass Index (BMI) 40.0 and over, adult: Secondary | ICD-10-CM

## 2023-02-19 DIAGNOSIS — Z9884 Bariatric surgery status: Secondary | ICD-10-CM

## 2023-02-19 DIAGNOSIS — J4489 Other specified chronic obstructive pulmonary disease: Secondary | ICD-10-CM | POA: Diagnosis present

## 2023-02-19 DIAGNOSIS — Z7901 Long term (current) use of anticoagulants: Secondary | ICD-10-CM | POA: Insufficient documentation

## 2023-02-19 DIAGNOSIS — E785 Hyperlipidemia, unspecified: Secondary | ICD-10-CM | POA: Diagnosis present

## 2023-02-19 DIAGNOSIS — I442 Atrioventricular block, complete: Secondary | ICD-10-CM | POA: Diagnosis present

## 2023-02-19 DIAGNOSIS — I2510 Atherosclerotic heart disease of native coronary artery without angina pectoris: Secondary | ICD-10-CM | POA: Diagnosis present

## 2023-02-19 DIAGNOSIS — I10 Essential (primary) hypertension: Secondary | ICD-10-CM | POA: Insufficient documentation

## 2023-02-19 DIAGNOSIS — Z9049 Acquired absence of other specified parts of digestive tract: Secondary | ICD-10-CM

## 2023-02-19 DIAGNOSIS — I700 Atherosclerosis of aorta: Secondary | ICD-10-CM | POA: Diagnosis present

## 2023-02-19 DIAGNOSIS — Z882 Allergy status to sulfonamides status: Secondary | ICD-10-CM | POA: Diagnosis not present

## 2023-02-19 DIAGNOSIS — Z79891 Long term (current) use of opiate analgesic: Secondary | ICD-10-CM

## 2023-02-19 DIAGNOSIS — K219 Gastro-esophageal reflux disease without esophagitis: Secondary | ICD-10-CM | POA: Diagnosis present

## 2023-02-19 DIAGNOSIS — I5033 Acute on chronic diastolic (congestive) heart failure: Secondary | ICD-10-CM | POA: Diagnosis present

## 2023-02-19 DIAGNOSIS — E119 Type 2 diabetes mellitus without complications: Secondary | ICD-10-CM | POA: Diagnosis present

## 2023-02-19 DIAGNOSIS — Z880 Allergy status to penicillin: Secondary | ICD-10-CM

## 2023-02-19 DIAGNOSIS — Z950 Presence of cardiac pacemaker: Secondary | ICD-10-CM

## 2023-02-19 DIAGNOSIS — Z881 Allergy status to other antibiotic agents status: Secondary | ICD-10-CM

## 2023-02-19 DIAGNOSIS — Z7951 Long term (current) use of inhaled steroids: Secondary | ICD-10-CM

## 2023-02-19 HISTORY — DX: Other persistent atrial fibrillation: I48.19

## 2023-02-19 LAB — BASIC METABOLIC PANEL
Anion gap: 8 (ref 5–15)
BUN: 24 mg/dL — ABNORMAL HIGH (ref 8–23)
CO2: 26 mmol/L (ref 22–32)
Calcium: 8.1 mg/dL — ABNORMAL LOW (ref 8.9–10.3)
Chloride: 102 mmol/L (ref 98–111)
Creatinine, Ser: 1.49 mg/dL — ABNORMAL HIGH (ref 0.44–1.00)
GFR, Estimated: 36 mL/min — ABNORMAL LOW (ref >60)
Glucose, Bld: 203 mg/dL — ABNORMAL HIGH (ref 70–99)
Potassium: 4.4 mmol/L (ref 3.5–5.1)
Sodium: 136 mmol/L (ref 135–145)

## 2023-02-19 LAB — GLUCOSE, CAPILLARY
Glucose-Capillary: 57 mg/dL — ABNORMAL LOW (ref 70–99)
Glucose-Capillary: 165 mg/dL — ABNORMAL HIGH (ref 70–99)

## 2023-02-19 LAB — MAGNESIUM: Magnesium: 2.3 mg/dL (ref 1.7–2.4)

## 2023-02-19 MED ORDER — AMIODARONE HCL IN DEXTROSE 360-4.14 MG/200ML-% IV SOLN
30.0000 mg/h | INTRAVENOUS | Status: AC
  Administered 2023-02-20 – 2023-02-21 (×3): 30 mg/h via INTRAVENOUS
  Filled 2023-02-19 (×3): qty 200

## 2023-02-19 MED ORDER — AMIODARONE HCL IN DEXTROSE 360-4.14 MG/200ML-% IV SOLN
60.0000 mg/h | INTRAVENOUS | Status: AC
  Administered 2023-02-19 (×2): 60 mg/h via INTRAVENOUS
  Filled 2023-02-19 (×2): qty 200

## 2023-02-19 MED ORDER — BUDESON-GLYCOPYRROL-FORMOTEROL 160-9-4.8 MCG/ACT IN AERO: 2.0000 | INHALATION_SPRAY | Freq: Two times a day (BID) | RESPIRATORY_TRACT | Status: DC

## 2023-02-19 MED ORDER — ZINC OXIDE 40 % EX OINT
TOPICAL_OINTMENT | Freq: Two times a day (BID) | CUTANEOUS | Status: DC
  Administered 2023-02-21: 1 via TOPICAL
  Filled 2023-02-19: qty 57

## 2023-02-19 MED ORDER — MOMETASONE FURO-FORMOTEROL FUM 200-5 MCG/ACT IN AERO
2.0000 | INHALATION_SPRAY | Freq: Two times a day (BID) | RESPIRATORY_TRACT | Status: DC
  Administered 2023-02-20 – 2023-02-27 (×15): 2 via RESPIRATORY_TRACT
  Filled 2023-02-19: qty 8.8

## 2023-02-19 MED ORDER — ROSUVASTATIN CALCIUM 5 MG PO TABS
10.0000 mg | ORAL_TABLET | Freq: Every day | ORAL | Status: DC
  Administered 2023-02-19 – 2023-02-26 (×8): 10 mg via ORAL
  Filled 2023-02-19 (×8): qty 2

## 2023-02-19 MED ORDER — ALBUTEROL SULFATE (2.5 MG/3ML) 0.083% IN NEBU
2.5000 mg | INHALATION_SOLUTION | Freq: Two times a day (BID) | RESPIRATORY_TRACT | Status: DC
  Administered 2023-02-19 – 2023-02-25 (×13): 2.5 mg via RESPIRATORY_TRACT
  Filled 2023-02-19 (×14): qty 3

## 2023-02-19 MED ORDER — TRAMADOL HCL 50 MG PO TABS
100.0000 mg | ORAL_TABLET | Freq: Two times a day (BID) | ORAL | Status: DC | PRN
  Administered 2023-02-19 – 2023-02-27 (×12): 100 mg via ORAL
  Filled 2023-02-19 (×13): qty 2

## 2023-02-19 MED ORDER — APIXABAN 5 MG PO TABS
5.0000 mg | ORAL_TABLET | Freq: Two times a day (BID) | ORAL | Status: DC
  Administered 2023-02-19 – 2023-02-27 (×16): 5 mg via ORAL
  Filled 2023-02-19 (×16): qty 1

## 2023-02-19 MED ORDER — SODIUM CHLORIDE 0.9% FLUSH
3.0000 mL | Freq: Two times a day (BID) | INTRAVENOUS | Status: DC
  Administered 2023-02-20 – 2023-02-26 (×10): 3 mL via INTRAVENOUS

## 2023-02-19 MED ORDER — FUROSEMIDE 40 MG PO TABS: 40.0000 mg | ORAL_TABLET | Freq: Every day | ORAL | Status: DC | PRN

## 2023-02-19 MED ORDER — SODIUM CHLORIDE 0.9% FLUSH: 3.0000 mL | INTRAVENOUS | Status: DC | PRN

## 2023-02-19 MED ORDER — AMIODARONE LOAD VIA INFUSION
150.0000 mg | Freq: Once | INTRAVENOUS | Status: AC
  Administered 2023-02-19: 150 mg via INTRAVENOUS
  Filled 2023-02-19: qty 83.34

## 2023-02-19 MED ORDER — GABAPENTIN 300 MG PO CAPS
300.0000 mg | ORAL_CAPSULE | Freq: Three times a day (TID) | ORAL | Status: DC
  Administered 2023-02-19 – 2023-02-27 (×22): 300 mg via ORAL
  Filled 2023-02-19 (×22): qty 1

## 2023-02-19 MED ORDER — INSULIN ASPART 100 UNIT/ML IJ SOLN
0.0000 [IU] | Freq: Every day | INTRAMUSCULAR | Status: DC
  Administered 2023-02-22 – 2023-02-24 (×3): 3 [IU] via SUBCUTANEOUS
  Administered 2023-02-25: 2 [IU] via SUBCUTANEOUS

## 2023-02-19 MED ORDER — INSULIN ASPART 100 UNIT/ML IJ SOLN
0.0000 [IU] | Freq: Three times a day (TID) | INTRAMUSCULAR | Status: DC
  Administered 2023-02-20 (×2): 7 [IU] via SUBCUTANEOUS
  Administered 2023-02-20: 11 [IU] via SUBCUTANEOUS
  Administered 2023-02-21 (×2): 7 [IU] via SUBCUTANEOUS
  Administered 2023-02-21: 11 [IU] via SUBCUTANEOUS
  Administered 2023-02-22: 4 [IU] via SUBCUTANEOUS
  Administered 2023-02-22 – 2023-02-23 (×3): 11 [IU] via SUBCUTANEOUS
  Administered 2023-02-23: 4 [IU] via SUBCUTANEOUS
  Administered 2023-02-23: 20 [IU] via SUBCUTANEOUS
  Administered 2023-02-24 (×2): 11 [IU] via SUBCUTANEOUS
  Administered 2023-02-24: 7 [IU] via SUBCUTANEOUS
  Administered 2023-02-25: 4 [IU] via SUBCUTANEOUS
  Administered 2023-02-25: 7 [IU] via SUBCUTANEOUS
  Administered 2023-02-25: 11 [IU] via SUBCUTANEOUS
  Administered 2023-02-26: 2 [IU] via SUBCUTANEOUS
  Administered 2023-02-26: 11 [IU] via SUBCUTANEOUS
  Administered 2023-02-26: 7 [IU] via SUBCUTANEOUS
  Administered 2023-02-27: 11 [IU] via SUBCUTANEOUS
  Administered 2023-02-27: 15 [IU] via SUBCUTANEOUS

## 2023-02-19 MED ORDER — DOFETILIDE 500 MCG PO CAPS: 500.0000 ug | ORAL_CAPSULE | Freq: Two times a day (BID) | ORAL | Status: DC

## 2023-02-19 MED ORDER — UMECLIDINIUM BROMIDE 62.5 MCG/ACT IN AEPB
1.0000 | INHALATION_SPRAY | Freq: Every day | RESPIRATORY_TRACT | Status: DC
  Administered 2023-02-20 – 2023-02-26 (×7): 1 via RESPIRATORY_TRACT
  Filled 2023-02-19: qty 7

## 2023-02-19 MED ORDER — FUROSEMIDE 10 MG/ML IJ SOLN
80.0000 mg | Freq: Two times a day (BID) | INTRAMUSCULAR | Status: DC
  Administered 2023-02-19 – 2023-02-23 (×8): 80 mg via INTRAVENOUS
  Filled 2023-02-19 (×8): qty 8

## 2023-02-19 MED ORDER — PANTOPRAZOLE SODIUM 40 MG PO TBEC
40.0000 mg | DELAYED_RELEASE_TABLET | Freq: Every day | ORAL | Status: DC
  Administered 2023-02-21 – 2023-02-27 (×7): 40 mg via ORAL
  Filled 2023-02-19 (×7): qty 1

## 2023-02-19 MED ORDER — ROPINIROLE HCL 0.5 MG PO TABS
4.0000 mg | ORAL_TABLET | Freq: Every day | ORAL | Status: DC
  Administered 2023-02-19 – 2023-02-20 (×2): 4 mg via ORAL
  Filled 2023-02-19 (×3): qty 8

## 2023-02-19 MED ORDER — SODIUM CHLORIDE 0.9 % IV SOLN: 250.0000 mL | INTRAVENOUS | Status: DC | PRN

## 2023-02-19 MED ORDER — METOPROLOL SUCCINATE ER 25 MG PO TB24
25.0000 mg | ORAL_TABLET | Freq: Every day | ORAL | Status: DC
  Administered 2023-02-20 – 2023-02-27 (×8): 25 mg via ORAL
  Filled 2023-02-19 (×8): qty 1

## 2023-02-19 NOTE — Progress Notes (Addendum)
Primary Care Physician: [facility] Referring Physician: Dr. Delila Pereyra is a 77 y.o. female with a h/o morbid obesity, aortic atherosclerosis and coronary artery calcifications on chest CT 2021, HTN, PPM, DM, that was referred by the device clinic for new onset afib since 12/27/20. Holly Spence today shows atrial  flutter with v rates controlled. She has been aware of a flutter in her chest for a while. She is basically w/c bound 2/2 to obesity and trouble ambulating. She had gastric sleeve in the past and lost down to 250 now at 300 lbs, heaviest weight was 350 lbs. She has not on anticoagulation. She states a bleeding ulcer a couple of years past but it was felt  to  be 2/2 Mobic. Pt is no longer taking and has not has any further bleeding. CHA2DS2VASc score is 6.   F/u in afib clinic, 01/12/21. She remains in afib, rate controlled. She has missed several of her Eliquis tabs but her daughter has set an alarm on pt's phone to alert her. We discussed a cardioversion again but she states that she does not feel the  afib but at this point she does not feel driven to undertake this.    F/u in afib clinic, 01/27/21. Pt returns still in afib, v paced and rate controlled.  She describes that she had some PND, orthopnea. Weight is up 5 lbs today. She does take lasix prn, she has not taken this week. On last visit, she voiced concern that she did not want to undergo cardioversion but with her latest symptoms, she will proceed with same. She has not missed any anticoagulation since before 7/6. She has an alarm set on her phone now to remind her.    On follow up, 01/08/23. Device alert on 6/13 for ongoing Afib since 6/4; Afib burden 5.2% with good ventricular rate control. No missed doses of Eliquis. She is not sure if she can feel when she is in Afib.  On follow up 02/19/23. Patient is here today for Tikosyn admission. No benadryl usage. She has not taken lisinopril/hydrochlorothiazide in  the past several days. No new medications since pharmacist review. No missed doses of Eliquis.  Today, she denies symptoms of palpitations, chest pain, shortness of breath, orthopnea, PND, lower extremity edema, dizziness, presyncope, syncope, or neurologic sequela. The patient is tolerating medications without difficulties and is otherwise without complaint today.   Past Medical History:  Diagnosis Date   Allergic rhinitis 09/20/2015   Arthritis    Ascending aorta dilation (HCC) 12/09/2018   Back pain    Benign essential hypertension 09/20/2015   Bruises easily    Complete heart block (HCC) 06/11/2020   Contact lens/glasses fitting    Cough    Depression 02/08/2019   Diabetes mellitus due to underlying condition with unspecified complications (HCC) 11/10/2015   Duodenal ulcer 02/08/2019   Fatigue    Gastroesophageal reflux disease 09/20/2015   GI bleeding 06/27/2019   Hx of laparoscopic gastric banding 01/31/2011   Surgery: 03/22/10    Hyperlipidemia    Insulin long-term use (HCC) 09/20/2015   Melena 02/08/2019   Moderate asthma 02/08/2019   Palpitations 11/10/2015   Poor circulation    Right renal mass 02/08/2019   Sinus problem    Sleep apnea    SOB (shortness of breath)    Symptomatic bradycardia 06/11/2020   Uncontrolled type 2 diabetes mellitus without complication, with long-term current use of insulin 09/20/2015   Past  Surgical History:  Procedure Laterality Date   CATARACT EXTRACTION     confirm date with patient   CHOLECYSTECTOMY  1987   EYE SURGERY  2003   retina   HERNIA REPAIR     LAPAROSCOPIC GASTRIC BANDING  03/22/10   PACEMAKER IMPLANT N/A 06/11/2020   Procedure: PACEMAKER IMPLANT;  Surgeon: Lanier Prude, MD;  Location: [facility];  Service: Cardiovascular;  Laterality: N/A;    Current Outpatient Medications  Medication Sig Dispense Refill   albuterol (VENTOLIN HFA) 108 (90 Base) MCG/ACT inhaler Inhale 2 puffs into the lungs every 6 (six) hours as needed for  wheezing or shortness of breath.      apixaban (ELIQUIS) 5 MG TABS tablet Take 1 tablet (5 mg total) by mouth 2 (two) times daily. 28 tablet 0   cyanocobalamin 1000 MCG tablet Take 1,000 mcg by mouth daily.     diclofenac sodium (VOLTAREN) 1 % GEL Apply 2-4 g topically every 6 (six) hours as needed (pain). (Patient not taking: Reported on 01/08/2023)     furosemide (LASIX) 40 MG tablet Take 1 tablet (40 mg total) by mouth daily. 30 tablet 0   gabapentin (NEURONTIN) 300 MG capsule Take 300 mg by mouth 3 (three) times daily.     insulin NPH-regular Human (70-30) 100 UNIT/ML injection Inject 55-90 Units into the skin See admin instructions. Inject 55-100 units into the skin before breakfast and supper per sliding scale     lisinopril (ZESTRIL) 20 MG tablet Take 1 tablet (20 mg total) by mouth daily. 30 tablet 5   liver oil-zinc oxide (DESITIN) 40 % ointment Apply topically 2 (two) times daily. (Patient not taking: Reported on 01/08/2023) 56.7 g 0   metoprolol succinate (TOPROL XL) 25 MG 24 hr tablet Take 1 tablet (25 mg total) by mouth daily. 90 tablet 3   mometasone-formoterol (DULERA) 100-5 MCG/ACT AERO Inhale 2 puffs into the lungs 2 (two) times daily. 1 each 0   pantoprazole (PROTONIX) 40 MG tablet Take 40 mg by mouth daily before breakfast.     rOPINIRole (REQUIP) 4 MG tablet Take 4 mg by mouth at bedtime.      rosuvastatin (CRESTOR) 10 MG tablet Take 10 mg by mouth at bedtime.     traMADol (ULTRAM) 50 MG tablet Take 100 mg by mouth in the morning and at bedtime.     No current facility-administered medications for this visit.    Allergies  Allergen Reactions   Penicillins Hives and Other (See Comments)    At injection site only- hives   Sulfa Antibiotics Hives   Erythromycin Other (See Comments)    Unsure    Azithromycin Rash and Other (See Comments)    Broke out the legs   Ciprofloxacin Hcl Other (See Comments)    Abdominal Pain   ROS- All systems are reviewed and negative except as  per the HPI above  Physical Exam: There were no vitals filed for this visit.   Wt Readings from Last 3 Encounters:  01/08/23 (!) 147.7 kg  12/28/22 (!) 147.4 kg  08/29/22 (!) 138.3 kg    Labs: Lab Results  Component Value Date   NA 134 (L) 12/30/2022   K 4.4 12/30/2022   CL 100 12/30/2022   CO2 24 12/30/2022   GLUCOSE 169 (H) 12/30/2022   BUN 27 (H) 12/30/2022   CREATININE 1.38 (H) 12/30/2022   CALCIUM 8.4 (L) 12/30/2022   Lab Results  Component Value Date   INR 1.0 07/15/2022  No results found for: "CHOL", "HDL", "LDLCALC", "TRIG"  GEN- The patient is well appearing, alert and oriented x 3 today.   Neck - no JVD or carotid bruit noted Lungs- Clear to ausculation bilaterally, normal work of breathing Heart- Tachycardic rate and rhythm, no murmurs, rubs or gallops, PMI not laterally displaced Extremities- no clubbing, cyanosis, or edema Skin - no rash or ecchymosis noted  EKG- Vent. rate 102 BPM PR interval * ms QRS duration 166 ms QT/QTcB 410/534 ms P-R-T axes * 266 67 Ventricular-paced rhythm Abnormal ECG When compared with ECG of 28-Dec-2022 22:41, PREVIOUS ECG IS PRESENT  Echo- 12/30/22:  1. Left ventricular ejection fraction, by estimation, is 55 to 60%. The  left ventricle has normal function. The left ventricle has no regional  wall motion abnormalities. There is mild left ventricular hypertrophy.  Left ventricular diastolic parameters  are indeterminate.   2. Right ventricular systolic function is normal. The right ventricular  size is mildly enlarged. There is moderately elevated pulmonary artery  systolic pressure. The estimated right ventricular systolic pressure is  47.7 mmHg.   3. The mitral valve is degenerative. Trivial mitral valve regurgitation.  No evidence of mitral stenosis. Moderate mitral annular calcification.   4. The aortic valve was not well visualized. Aortic valve regurgitation  is not visualized. Aortic valve sclerosis is  present, with no evidence of  aortic valve stenosis.   5. Aortic dilatation noted. There is mild dilatation of the ascending  aorta, measuring 41 mm.   6. The inferior vena cava is dilated in size with <50% respiratory  variability, suggesting right atrial pressure of 15 mmHg.   Assessment and Plan:   1. Afib/ atrial flutter Recent increase in Afib burden via device alert  Patient presents today for dofetilide admission. Continue Eliquis, states no missed doses in the last 3 weeks. No recent benadryl use. PharmD has screened medications. QTc in SR 506 ms   Labs addendum: Creat 1.49, K 4.4, mag 2.3. CrCl estimate 70 mL/min.    2. CHA2DS2VASc score of 6 Continue  eliquis 5 mg bid    Patient will present to admissions when bed is available.    Lake Bells, PA-C Af[facility] [address] [place], [place] [address] [phone]

## 2023-02-19 NOTE — Plan of Care (Signed)

## 2023-02-19 NOTE — Progress Notes (Deleted)
Pharmacy: Dofetilide (Tikosyn) - Initial Consult Assessment and Electrolyte Replacement  Pharmacy consulted to assist in monitoring and replacing electrolytes in this 77 y.o. female admitted on 02/19/2023 undergoing dofetilide initiation. First dofetilide dose: 8/12 2000  Assessment:  Patient Exclusion Criteria: If any screening criteria checked as "Yes", then  patient  should NOT receive dofetilide until criteria item is corrected.  If "Yes" please indicate correction plan.  YES  NO Patient  Exclusion Criteria Correction Plan   [x]   []   Baseline QTc interval is greater than or equal to 440 msec. IF above YES box checked dofetilide contraindicated unless patient has ICD; then may proceed if QTc 500-550 msec or with known ventricular conduction abnormalities may proceed with QTc 550-600 msec. QTc = Per MD note 8/12 "QTc in SR is ~438ms (510 corrected further with QRS of 172 ms)" Monitor closely   []   [x]   Patient is known or suspected to have a digoxin level greater than 2 ng/ml: No results found for: "DIGOXIN"     []   [x]   Creatinine clearance less than 20 ml/min (calculated using Cockcroft-Gault, actual body weight and serum creatinine): Estimated Creatinine Clearance: 43.2 mL/min (A) (by C-G formula based on SCr of 1.49 mg/dL (H)).     []   [x]  Patient has received drugs known to prolong the QT intervals within the last 48 hours (phenothiazines, tricyclics or tetracyclic antidepressants, erythromycin, H-1 antihistamines, cisapride, fluoroquinolones, azithromycin, ondansetron).   Updated information on QT prolonging agents is available to be searched on the following database:QT prolonging agents     []   [x]   Patient received a dose of hydrochlorothiazide (Oretic) alone or in any combination including triamterene (Dyazide, Maxzide) in the last 48 hours.    []   [x]  Patient received a medication known to increase dofetilide plasma concentrations prior to initial dofetilide dose:   Trimethoprim (Primsol, Proloprim) in the last 36 hours Verapamil (Calan, Verelan) in the last 36 hours or a sustained release dose in the last 72 hours Megestrol (Megace) in the last 5 days  Cimetidine (Tagamet) in the last 6 hours Ketoconazole (Nizoral) in the last 24 hours Itraconazole (Sporanox) in the last 48 hours  Prochlorperazine (Compazine) in the last 36 hours     []   [x]   Patient is known to have a history of torsades de pointes; congenital or acquired long QT syndromes.    []   [x]   Patient has received a Class 1 antiarrhythmic with less than 2 half-lives since last dose. (Disopyramide, Quinidine, Procainamide, Lidocaine, Mexiletine, Flecainide, Propafenone)    []   [x]   Patient has received amiodarone therapy in the past 3 months or amiodarone level is greater than 0.3 ng/ml.    Labs:    Component Value Date/Time   K 4.4 02/19/2023 1139   MG 2.3 02/19/2023 1139     Plan: Utilized Cockcroft Gault equation (using actual body weight) and calculated CrCl 70 ml/min (this equation is what is listed in FDA supplement approval for Tikosyn dosing) [employer] Calculated CrCl  Dose q12h  [x]  > 60 ml/min 500 mcg  []  40-60 ml/min 250 mcg  []  20-40 ml/min 125 mcg   [x]   Physician selected initial dose within range recommended for patients level of renal function - will monitor for response.  []   Physician selected initial dose outside of range recommended for patients level of renal function - will discuss if the dose should be altered at this time.   Patient has been appropriately anticoagulated with Eliquis.  Potassium: K >/=  4: Appropriate to initiate Tikosyn, no replacement needed    Magnesium: Mg >2: Appropriate to initiate Tikosyn, no replacement needed     Thank you for allowing pharmacy to participate in this patient's care   Christoper Fabian, PharmD, BCPS Please see amion for complete clinical pharmacist phone list 02/19/2023  4:11 PM

## 2023-02-19 NOTE — Addendum Note (Signed)
Encounter addended by: Eustace Pen, PA-C on: 02/19/2023 1:27 PM  Actions taken: Clinical Note Signed

## 2023-02-19 NOTE — Progress Notes (Signed)
Iv team arrived to pt room per consult. Found RN at bedside and obtained PIV access, stated IV team no longer needed at this time.

## 2023-02-19 NOTE — H&P (Signed)
Primary Care Physician: [facility] Referring Physician: Dr. Delila Pereyra is a 77 y.o. female with a h/o morbid obesity, aortic atherosclerosis and coronary artery calcifications on chest CT 2021, HTN, PPM, DM, that was referred by the device clinic for new onset afib since 12/27/20. Raynelle Fanning today shows atrial  flutter with v rates controlled. She has been aware of a flutter in her chest for a while. She is basically w/c bound 2/2 to obesity and trouble ambulating. She had gastric sleeve in the past and lost down to 250 now at 300 lbs, heaviest weight was 350 lbs. She has not on anticoagulation. She states a bleeding ulcer a couple of years past but it was felt  to  be 2/2 Mobic. Pt is no longer taking and has not has any further bleeding. CHA2DS2VASc score is 6.   F/u in afib clinic, 01/12/21. She remains in afib, rate controlled. She has missed several of her Eliquis tabs but her daughter has set an alarm on pt's phone to alert her. We discussed a cardioversion again but she states that she does not feel the  afib but at this point she does not feel driven to undertake this.    F/u in afib clinic, 01/27/21. Pt returns still in afib, v paced and rate controlled.  She describes that she had some PND, orthopnea. Weight is up 5 lbs today. She does take lasix prn, she has not taken this week. On last visit, she voiced concern that she did not want to undergo cardioversion but with her latest symptoms, she will proceed with same. She has not missed any anticoagulation since before 7/6. She has an alarm set on her phone now to remind her.    On follow up, 01/08/23. Device alert on 6/13 for ongoing Afib since 6/4; Afib burden 5.2% with good ventricular rate control. No missed doses of Eliquis. She is not sure if she can feel when she is in Afib.  On follow up 02/19/23. Patient is here today for Tikosyn admission. No benadryl usage. She has not taken lisinopril/hydrochlorothiazide in  the past several days. No new medications since pharmacist review. No missed doses of Eliquis.  Today, she denies symptoms of palpitations, chest pain, shortness of breath, orthopnea, PND, lower extremity edema, dizziness, presyncope, syncope, or neurologic sequela. The patient is tolerating medications without difficulties and is otherwise without complaint today.   Past Medical History:  Diagnosis Date   Allergic rhinitis 09/20/2015   Arthritis    Ascending aorta dilation (HCC) 12/09/2018   Back pain    Benign essential hypertension 09/20/2015   Bruises easily    Complete heart block (HCC) 06/11/2020   Contact lens/glasses fitting    Cough    Depression 02/08/2019   Diabetes mellitus due to underlying condition with unspecified complications (HCC) 11/10/2015   Duodenal ulcer 02/08/2019   Fatigue    Gastroesophageal reflux disease 09/20/2015   GI bleeding 06/27/2019   Hx of laparoscopic gastric banding 01/31/2011   Surgery: 03/22/10    Hyperlipidemia    Insulin long-term use (HCC) 09/20/2015   Melena 02/08/2019   Moderate asthma 02/08/2019   Palpitations 11/10/2015   Poor circulation    Right renal mass 02/08/2019   Sinus problem    Sleep apnea    SOB (shortness of breath)    Symptomatic bradycardia 06/11/2020   Uncontrolled type 2 diabetes mellitus without complication, with long-term current use of insulin 09/20/2015   Past  Surgical History:  Procedure Laterality Date   CATARACT EXTRACTION     confirm date with patient   CHOLECYSTECTOMY  1987   EYE SURGERY  2003   retina   HERNIA REPAIR     LAPAROSCOPIC GASTRIC BANDING  03/22/10   PACEMAKER IMPLANT N/A 06/11/2020   Procedure: PACEMAKER IMPLANT;  Surgeon: Lanier Prude, MD;  Location: [facility];  Service: Cardiovascular;  Laterality: N/A;    No current facility-administered medications for this encounter.    Allergies  Allergen Reactions   Penicillins Hives and Other (See Comments)    At injection site only- hives   Sulfa  Antibiotics Hives   Erythromycin Other (See Comments)    Unsure    Azithromycin Rash and Other (See Comments)    Broke out the legs   Ciprofloxacin Hcl Other (See Comments)    Abdominal Pain   ROS- All systems are reviewed and negative except as per the HPI above  Physical Exam: HR 102 bpm BP 118/60   Wt Readings from Last 3 Encounters:  02/19/23 (!) 144.9 kg  01/08/23 (!) 147.7 kg  12/28/22 (!) 147.4 kg    Labs: Lab Results  Component Value Date   NA 136 02/19/2023   K 4.4 02/19/2023   CL 102 02/19/2023   CO2 26 02/19/2023   GLUCOSE 203 (H) 02/19/2023   BUN 24 (H) 02/19/2023   CREATININE 1.49 (H) 02/19/2023   CALCIUM 8.1 (L) 02/19/2023   MG 2.3 02/19/2023   Lab Results  Component Value Date   INR 1.0 07/15/2022   No results found for: "CHOL", "HDL", "LDLCALC", "TRIG"  GEN- The patient is well appearing, alert and oriented x 3 today.   Neck - no JVD or carotid bruit noted Lungs- Clear to ausculation bilaterally, normal work of breathing Heart- Tachycardic rate and rhythm, no murmurs, rubs or gallops, PMI not laterally displaced Extremities- no clubbing, cyanosis, or edema Skin - no rash or ecchymosis noted  EKG- Vent. rate 102 BPM PR interval * ms QRS duration 166 ms QT/QTcB 410/534 ms P-R-T axes * 266 67 Ventricular-paced rhythm Abnormal ECG When compared with ECG of 28-Dec-2022 22:41, PREVIOUS ECG IS PRESENT  Echo- 12/30/22:  1. Left ventricular ejection fraction, by estimation, is 55 to 60%. The  left ventricle has normal function. The left ventricle has no regional  wall motion abnormalities. There is mild left ventricular hypertrophy.  Left ventricular diastolic parameters  are indeterminate.   2. Right ventricular systolic function is normal. The right ventricular  size is mildly enlarged. There is moderately elevated pulmonary artery  systolic pressure. The estimated right ventricular systolic pressure is  47.7 mmHg.   3. The mitral valve is  degenerative. Trivial mitral valve regurgitation.  No evidence of mitral stenosis. Moderate mitral annular calcification.   4. The aortic valve was not well visualized. Aortic valve regurgitation  is not visualized. Aortic valve sclerosis is present, with no evidence of  aortic valve stenosis.   5. Aortic dilatation noted. There is mild dilatation of the ascending  aorta, measuring 41 mm.   6. The inferior vena cava is dilated in size with <50% respiratory  variability, suggesting right atrial pressure of 15 mmHg.   Assessment and Plan:   Afib/ atrial flutter Recent increase in Afib burden via device alert Continue Eliquis, states no missed doses in the last 3 weeks. No recent benadryl use. PharmD has screened medications. On arrival she is markedly volume overloaded and in review with MD,  QT is felt too long to start Tikosyn.  We will proceed with IV diuresis and amiodarone load, keeping [facility] scheduled for now.   Labs addendum: Creat 1.49, K 4.4, mag 2.3. CrCl estimate 70 mL/min.   CHA2DS2VASc score of 6 Continue  eliquis 5 mg bid  Acute on chronic diastolic CHF EF 60-65% in June Had been taking 40 mg lasix daily at home Give 80 mg IV lasix BID starting tonight. May need gtt.  Consider unna boots.   DM2 Sliding scale DM coordinator  Pt presents for admission for Tikosyn, though is noted to have a long QT and marked volume overload. Will change plan to amiodarone load with aggressive diuresis.  Cardioversion later this week pending course   Doreatha Martin, [place]  02/19/2023 4:03 PM

## 2023-02-20 DIAGNOSIS — I4819 Other persistent atrial fibrillation: Secondary | ICD-10-CM

## 2023-02-20 LAB — GLUCOSE, CAPILLARY
Glucose-Capillary: 228 mg/dL — ABNORMAL HIGH (ref 70–99)
Glucose-Capillary: 210 mg/dL — ABNORMAL HIGH (ref 70–99)
Glucose-Capillary: 274 mg/dL — ABNORMAL HIGH (ref 70–99)
Glucose-Capillary: 184 mg/dL — ABNORMAL HIGH (ref 70–99)

## 2023-02-20 NOTE — Inpatient Diabetes Management (Signed)
Inpatient Diabetes Program Recommendations  AACE/ADA: New Consensus Statement on Inpatient Glycemic Control (2015)  Target Ranges:  Prepandial:   less than 140 mg/dL      Peak postprandial:   less than 180 mg/dL (1-2 hours)      Critically ill patients:  140 - 180 mg/dL   Lab Results  Component Value Date   GLUCAP 210 (H) 02/20/2023   HGBA1C 7.7 (H) 12/28/2022    Review of Glycemic Control  Latest Reference Range & Units 02/19/23 16:45 02/19/23 20:59 02/20/23 07:58 02/20/23 11:40  Glucose-Capillary 70 - 99 mg/dL 57 (L) 161 (H) 096 (H) 210 (H)  (L): Data is abnormally low (H): Data is abnormally high  Diabetes history: DM2 Outpatient Diabetes medications: Novolin 70/30 55-90 units sliding scale BID Current orders for Inpatient glycemic control: Novolog 0-20 units TID and 0-5 units QHS  Inpatient Diabetes Program Recommendations:    May need small dose of basal if glucose remains >180 mg/dL-Semglee 22 units at bedtime (0.15 units/kg)  Spoke with patient at bedside.  She confirms above home 70/30.  Her endocrinologist prescribed this a few years ago.  She has not seen her endocrinologist since 2021 due to high co-pay.  She checks her glucose BID.  When asked if she has any episodes of hypoglycemia, she states, "Oh yes, everyday".  States she is low before dinner (54-70 mg/dL).  Last A1C was 7.7% with PCP last visit.    Asked if she has ever wore a CGM; she states she has the [employer] 3 at home.  She has tried to wear one a few times but it falls off.  Told her she can get overlays to help keep it on or Skin Tac which makes the skin sticky and helps keep it on.  She verbalizes understanding.    Explained she will likely need to adjust her 70/30 and titrate down to avoid hypoglycemia.  She verbalizes understanding.    Will continue to follow while inpatient.  Thank you, Dulce Sellar, MSN, CDCES Diabetes Coordinator Inpatient Diabetes Program [phone] (team pager from  8a-5p)

## 2023-02-20 NOTE — Progress Notes (Signed)
  Patient Name: Holly Spence Date of Encounter: 02/20/2023  Primary Cardiologist: Garwin Brothers, MD Electrophysiologist: Lanier Prude, MD  Interval Summary   The patient is doing well today.  UOP not well recorder but nearly clear in container and patient reports " a lot".   Inpatient Medications    Scheduled Meds:  albuterol  2.5 mg Inhalation BID AC & HS   apixaban  5 mg Oral BID   furosemide  80 mg Intravenous BID   gabapentin  300 mg Oral TID   insulin aspart  0-20 Units Subcutaneous TID WC   insulin aspart  0-5 Units Subcutaneous QHS   liver oil-zinc oxide   Topical BID   metoprolol succinate  25 mg Oral Daily   mometasone-formoterol  2 puff Inhalation BID   And   umeclidinium bromide  1 puff Inhalation Daily   [START ON 02/21/2023] pantoprazole  40 mg Oral QAC breakfast   rOPINIRole  4 mg Oral QHS   rosuvastatin  10 mg Oral QHS   sodium chloride flush  3 mL Intravenous Q12H   Continuous Infusions:  sodium chloride     amiodarone 30 mg/hr (02/20/23 0725)   PRN Meds: sodium chloride, sodium chloride flush, traMADol   Vital Signs    Vitals:   02/20/23 0222 02/20/23 0514 02/20/23 0729 02/20/23 0757  BP: (!) 132/52 (!) 109/56  (!) 115/35  Pulse: 74 99  70  Resp:  17  16  Temp:  98 F (36.7 C)  97.9 F (36.6 C)  TempSrc:  Oral  Oral  SpO2: 100% 97% 93% 96%  Weight:      Height:        Intake/Output Summary (Last 24 hours) at 02/20/2023 0846 Last data filed at 02/20/2023 0524 Gross per 24 hour  Intake 603.76 ml  Output 500 ml  Net 103.76 ml   Filed Weights   02/19/23 1613  Weight: (!) 145.2 kg    Physical Exam    GEN- The patient is well appearing, alert and oriented x 3 today.   Lungs- Clear to ausculation bilaterally, normal work of breathing Cardiac- Regular rate and rhythm ( paced), no murmurs, rubs or gallops GI- soft, NT, ND, + BS Extremities- no clubbing or cyanosis. No edema  Telemetry    V pacing in 70-90s , appears to show  intermittent atrial pacing (personally reviewed)  Hospital Course    Ms. Wildrick is a 77 year old woman who is being admitted for atrial fibrillation. She has a complex medical history that includes morbid obesity, hypertension, diabetes, permanent pacemaker in situ, atrial fibrillation/flutter   Assessment & Plan    Afib/ atrial flutter Recent increase in Afib burden via device alert Continue Eliquis, states no missed doses in the last 3 weeks. Continue IV amiodarone.  ? Intermittent atrial pacing and appears to be in sinus. Will formally interrogate later this am.    CHA2DS2VASc score of 6 Continue  eliquis 5 mg bid   Acute on chronic diastolic CHF EF 60-65% in June Continue IV lasix 80 mg BID for now.  Strict I/Os.    DM2 Sliding scale DM coordinator  Feeling better this am. Continue IV lasix and amiodarone today.   For questions or updates, please contact [facility] Please consult [email or web address] for contact info under Cardiology/STEMI.  Signed, Graciella Freer, PA-C  02/20/2023, 8:46 AM

## 2023-02-20 NOTE — Plan of Care (Signed)

## 2023-02-20 NOTE — Progress Notes (Signed)
  Brief device interrogation shows AS-VP rhythm / NSR.   Will cancel [facility] for tomorrow, 8/14.   Cath lab made aware for availability of other patients.    Casimiro Needle [address] [place], PA-C  02/20/2023 11:18 AM

## 2023-02-20 NOTE — TOC Initial Note (Signed)
Transition of Care [facility]) - Initial/Assessment Note    Patient Details  Name: Holly Spence MRN: [id number] Date of Birth: 1945-10-10  [facility] CM/SW Contact:    Gala Lewandowsky, RN Phone Number: 02/20/2023, 4:11 PM  Clinical Narrative:  Patient presented for persistent atrial fib. PTA patient was from home with spouse and daughter. Patient is currently active with [facility] PT/OT. Patient has DME rolling walker, cane, and power chair in the home. Patient will need resumption orders and face to face once stable to transition home.                Expected Discharge Plan: Home w Home Health Services Barriers to Discharge: Continued Medical Work up   Patient Goals and CMS Choice Patient states their goals for this hospitalization and ongoing recovery are:: to return home   Expected Discharge Plan and Services In-house Referral: NA Discharge Planning Services: CM Consult Post Acute Care Choice: Home Health, Resumption of Svcs/PTA Provider Living arrangements for the past 2 months: Single Family Home                   HH Arranged: PT, OT HH Agency: [facility] Date [facility] Agency Contacted: 02/20/23 Time HH Agency Contacted: 0930 Representative spoke with at [facility] Agency: Brandi  Prior Living Arrangements/Services Living arrangements for the past 2 months: Single Family Home Lives with:: Spouse Patient language and need for interpreter reviewed:: Yes Do you feel safe going back to the place where you live?: Yes      Need for Family Participation in Patient Care: Yes (Comment) Care giver support system in place?: Yes (comment) Current home services: DME (rolling walker, cane, power chair.) Criminal Activity/Legal Involvement Pertinent to Current Situation/Hospitalization: No - Comment as needed  Activities of Daily Living Home Assistive Devices/Equipment: Cane (specify quad or straight), Walker (specify type) ADL Screening (condition  at time of admission) Patient's cognitive ability adequate to safely complete daily activities?: Yes Is the patient deaf or have difficulty hearing?: No Does the patient have difficulty seeing, even when wearing glasses/contacts?: No Does the patient have difficulty concentrating, remembering, or making decisions?: Yes Patient able to express need for assistance with ADLs?: Yes Does the patient have difficulty dressing or bathing?: Yes Independently performs ADLs?: Yes (appropriate for developmental age) Does the patient have difficulty walking or climbing stairs?: Yes Weakness of Legs: Both Weakness of Arms/Hands: Both  Permission Sought/Granted Permission sought to share information with : Family Supports, Magazine features editor, Case Estate manager/land agent granted to share information with : Yes, Verbal Permission Granted     Permission granted to share info w AGENCY: [facility]        Emotional Assessment Appearance:: Appears stated age Attitude/Demeanor/Rapport: Engaged Affect (typically observed): Appropriate Orientation: : Oriented to Self, Oriented to Place, Oriented to  Time, Oriented to Situation Alcohol / Substance Use: Not Applicable Psych Involvement: No (comment)  Admission diagnosis:  Persistent atrial fibrillation (HCC) [I48.19] Patient Active Problem List   Diagnosis Date Noted   Persistent atrial fibrillation (HCC) 02/19/2023   New onset a-fib (HCC) 01/08/2023   Hypercoagulable state due to persistent atrial fibrillation (HCC) 01/08/2023   Cellulitis of right leg 12/28/2022   Arthritis 11/07/2021   Back pain 11/07/2021   Bruises easily 11/07/2021   Contact lens/glasses fitting 11/07/2021   Cough 11/07/2021   Fatigue 11/07/2021   Poor circulation 11/07/2021   Sinus problem 11/07/2021   Sleep apnea 11/07/2021   SOB (shortness  of breath) 11/07/2021   Symptomatic bradycardia 06/11/2020   Complete heart block (HCC) 06/11/2020   GI bleeding  06/27/2019   Depression 02/08/2019   Duodenal ulcer 02/08/2019   Melena 02/08/2019   Moderate asthma 02/08/2019   Right renal mass 02/08/2019   Ascending aorta dilation (HCC) 12/09/2018   Diabetes mellitus due to underlying condition with unspecified complications (HCC) 11/10/2015   Palpitations 11/10/2015   Allergic rhinitis 09/20/2015   Benign essential hypertension 09/20/2015   Gastroesophageal reflux disease 09/20/2015   Hyperlipidemia 09/20/2015   Insulin long-term use (HCC) 09/20/2015   Uncontrolled type 2 diabetes mellitus without complication, with long-term current use of insulin 09/20/2015   Hx of laparoscopic gastric banding 01/31/2011   PCP:  [facility] Pharmacy:   [facility] [address] [place] - [address] [facility] [place] [address] Phone: [phone] Fax: [phone]  Social Determinants of Health (SDOH) Social History: SDOH Screenings   Food Insecurity: No Food Insecurity (02/19/2023)  Housing: Medium Risk (02/19/2023)  Transportation Needs: No Transportation Needs (02/19/2023)  Utilities: Not At Risk (02/19/2023)  Depression (PHQ2-9): Low Risk  (02/03/2019)  Social Connections: Unknown (11/22/2021)   Received from [facility]  Tobacco Use: Medium Risk (02/19/2023)   Readmission Risk Interventions    12/30/2022    3:47 PM  Readmission Risk Prevention Plan  Transportation Screening Complete  PCP or Specialist Appt within 5-7 Days Complete  Home Care Screening Complete  Medication Review (RN CM) Complete

## 2023-02-21 ENCOUNTER — Encounter (HOSPITAL_COMMUNITY)
Admission: AD | Disposition: A | Discharge status: Home / Self Care | Payer: Self-pay | Source: Ambulatory Visit | Attending: Cardiology

## 2023-02-21 ENCOUNTER — Inpatient Hospital Stay (HOSPITAL_COMMUNITY): Payer: Medicare Other

## 2023-02-21 DIAGNOSIS — I4819 Other persistent atrial fibrillation: Secondary | ICD-10-CM | POA: Diagnosis not present

## 2023-02-21 LAB — GLUCOSE, CAPILLARY
Glucose-Capillary: 205 mg/dL — ABNORMAL HIGH (ref 70–99)
Glucose-Capillary: 272 mg/dL — ABNORMAL HIGH (ref 70–99)
Glucose-Capillary: 229 mg/dL — ABNORMAL HIGH (ref 70–99)
Glucose-Capillary: 200 mg/dL — ABNORMAL HIGH (ref 70–99)

## 2023-02-21 SURGERY — CARDIOVERSION
Anesthesia: General

## 2023-02-21 MED ORDER — ROPINIROLE HCL 1 MG PO TABS
4.0000 mg | ORAL_TABLET | Freq: Every day | ORAL | Status: DC
  Administered 2023-02-21 – 2023-02-26 (×6): 4 mg via ORAL
  Filled 2023-02-21 (×7): qty 4

## 2023-02-21 MED ORDER — AMIODARONE HCL 200 MG PO TABS
400.0000 mg | ORAL_TABLET | Freq: Two times a day (BID) | ORAL | Status: DC
  Administered 2023-02-21 – 2023-02-27 (×13): 400 mg via ORAL
  Filled 2023-02-21 (×13): qty 2

## 2023-02-21 NOTE — Progress Notes (Addendum)
  Patient Name: Holly Spence Date of Encounter: 02/21/2023  Primary Cardiologist: Garwin Brothers, MD Electrophysiologist: Lanier Prude, MD  Interval Summary   The patient is doing well today.  Having some productive cough and chest tightness/rawness.  Inpatient Medications    Scheduled Meds:  albuterol  2.5 mg Inhalation BID AC & HS   apixaban  5 mg Oral BID   furosemide  80 mg Intravenous BID   gabapentin  300 mg Oral TID   insulin aspart  0-20 Units Subcutaneous TID WC   insulin aspart  0-5 Units Subcutaneous QHS   liver oil-zinc oxide   Topical BID   metoprolol succinate  25 mg Oral Daily   mometasone-formoterol  2 puff Inhalation BID   And   umeclidinium bromide  1 puff Inhalation Daily   pantoprazole  40 mg Oral QAC breakfast   rOPINIRole  4 mg Oral QHS   rosuvastatin  10 mg Oral QHS   sodium chloride flush  3 mL Intravenous Q12H   Continuous Infusions:  sodium chloride     amiodarone 30 mg/hr (02/21/23 0640)   PRN Meds: sodium chloride, sodium chloride flush, traMADol   Vital Signs    Vitals:   02/20/23 1926 02/20/23 1945 02/21/23 0355 02/21/23 0727  BP: (!) 130/41  (!) 129/56   Pulse: 63 66 69   Resp: 17 16 18    Temp: 98.1 F (36.7 C)  98.1 F (36.7 C)   TempSrc: Oral  Oral   SpO2: 98% 99% 97% 97%  Weight:      Height:        Intake/Output Summary (Last 24 hours) at 02/21/2023 0759 Last data filed at 02/21/2023 0640 Gross per 24 hour  Intake 752.32 ml  Output 2540 ml  Net -1787.68 ml   Filed Weights   02/19/23 1613  Weight: (!) 145.2 kg    Physical Exam    GEN- The patient is well appearing, alert and oriented x 3 today.   Lungs- +Rhonchi that clear with cough, mostly upper.  Cardiac- Regular rate and rhythm, no murmurs, rubs or gallops GI- soft, NT, ND, + BS Extremities- no clubbing or cyanosis. 1-2+ BLE edema, mostly in calves with some dependent edema in thighs  Telemetry    NSR 60-70s, V pacing, intermittent A pacing  (personally reviewed)  Hospital Course    Holly Spence is a 77 year old woman who is being admitted for atrial fibrillation. She has a complex medical history that includes morbid obesity, hypertension, diabetes, permanent pacemaker in situ, atrial fibrillation/flutter   Assessment & Plan    Afib/ atrial flutter Recent increase in Afib burden via device alert Continue Eliquis, states no missed doses in the last 3 weeks. Transition to amiodarone 400 mg BID  In NSR by tele and device interrogation.    CHA2DS2VASc score of 6 Continue  eliquis 5 mg bid   Acute on chronic diastolic CHF EF 60-65% in June Continue IV lasix 80 mg BID through today with continued UOP and stable Cr.  Strict I/Os.    DM2 Sliding scale DM coordinator  Productive cough CXR / Incentive Spirometer Afebrile. +Rhonchi that clear with cough, mostly upper.    For questions or updates, please contact [facility] Please consult [email or web address] for contact info under Cardiology/STEMI.  Signed, Graciella Freer, PA-C  02/21/2023, 7:59 AM

## 2023-02-21 NOTE — Plan of Care (Signed)

## 2023-02-21 NOTE — Inpatient Diabetes Management (Addendum)
Inpatient Diabetes Program Recommendations  AACE/ADA: New Consensus Statement on Inpatient Glycemic Control (2015)  Target Ranges:  Prepandial:   less than 140 mg/dL      Peak postprandial:   less than 180 mg/dL (1-2 hours)      Critically ill patients:  140 - 180 mg/dL   Lab Results  Component Value Date   GLUCAP 272 (H) 02/21/2023   HGBA1C 7.7 (H) 12/28/2022    Review of Glycemic Control  Latest Reference Range & Units 02/21/23 07:40 02/21/23 11:13  Glucose-Capillary 70 - 99 mg/dL 098 (H) 119 (H)  (H): Data is abnormally high  Diabetes history: DM2 Outpatient Diabetes medications: Novolin 70/30 55-90 units sliding scale BID Current orders for Inpatient glycemic control: Novolog 0-20 units TID and 0-5 units QHS  Inpatient Diabetes Program Recommendations:    Please consider:  Semglee 15 units every day (0.1 units/kg) Novolog 0-15 units TID Novolog 2 units TID with meals if she consumes at least 50%  Will continue to follow while inpatient.  Thank you, Dulce Sellar, MSN, CDCES Diabetes Coordinator Inpatient Diabetes Program [phone] (team pager from 8a-5p)

## 2023-02-22 DIAGNOSIS — I4819 Other persistent atrial fibrillation: Secondary | ICD-10-CM | POA: Diagnosis not present

## 2023-02-22 LAB — BASIC METABOLIC PANEL
Anion gap: 9 (ref 5–15)
BUN: 27 mg/dL — ABNORMAL HIGH (ref 8–23)
CO2: 32 mmol/L (ref 22–32)
Calcium: 8.5 mg/dL — ABNORMAL LOW (ref 8.9–10.3)
Chloride: 95 mmol/L — ABNORMAL LOW (ref 98–111)
Creatinine, Ser: 1.65 mg/dL — ABNORMAL HIGH (ref 0.44–1.00)
GFR, Estimated: 31 mL/min — ABNORMAL LOW (ref >60)
Glucose, Bld: 287 mg/dL — ABNORMAL HIGH (ref 70–99)
Potassium: 4.6 mmol/L (ref 3.5–5.1)
Sodium: 136 mmol/L (ref 135–145)

## 2023-02-22 LAB — GLUCOSE, CAPILLARY
Glucose-Capillary: 198 mg/dL — ABNORMAL HIGH (ref 70–99)
Glucose-Capillary: 272 mg/dL — ABNORMAL HIGH (ref 70–99)
Glucose-Capillary: 270 mg/dL — ABNORMAL HIGH (ref 70–99)
Glucose-Capillary: 270 mg/dL — ABNORMAL HIGH (ref 70–99)

## 2023-02-22 LAB — MAGNESIUM: Magnesium: 1.8 mg/dL (ref 1.7–2.4)

## 2023-02-22 LAB — TSH: TSH: 0.761 u[IU]/mL (ref 0.350–4.500)

## 2023-02-22 MED ORDER — INSULIN GLARGINE 100 UNIT/ML ~~LOC~~ SOLN
20.0000 [IU] | Freq: Every day | SUBCUTANEOUS | Status: DC
  Administered 2023-02-22: 20 [IU] via SUBCUTANEOUS
  Filled 2023-02-22 (×3): qty 0.2

## 2023-02-22 MED ORDER — INSULIN GLARGINE-YFGN 100 UNIT/ML ~~LOC~~ SOLN
20.0000 [IU] | Freq: Every day | SUBCUTANEOUS | Status: DC
  Administered 2023-02-23 – 2023-02-27 (×5): 20 [IU] via SUBCUTANEOUS
  Filled 2023-02-22 (×5): qty 0.2

## 2023-02-22 MED ORDER — DOXYCYCLINE HYCLATE 100 MG PO TABS
100.0000 mg | ORAL_TABLET | Freq: Two times a day (BID) | ORAL | Status: AC
  Administered 2023-02-22 – 2023-02-26 (×10): 100 mg via ORAL
  Filled 2023-02-22 (×10): qty 1

## 2023-02-22 NOTE — Progress Notes (Signed)
Nutrition Brief Note  Patient identified on the Malnutrition Screening Tool (MST) Report  Wt Readings from Last 15 Encounters:  02/19/23 (!) 145.2 kg  02/19/23 (!) 144.9 kg  01/08/23 (!) 147.7 kg  12/28/22 (!) 147.4 kg  08/29/22 (!) 138.3 kg  11/08/21 134.4 kg  08/19/21 (!) 144.7 kg  02/02/21 (!) 139.3 kg  01/27/21 (!) 139 kg  01/12/21 (!) 137 kg  12/29/20 136.1 kg  06/12/20 (!) 143.5 kg  06/11/20 (!) 143.9 kg  03/18/20 (!) 140.7 kg  02/20/19 132 kg    Body mass index is 56.69 kg/m. Patient meets criteria for morbidly obese based on current BMI.   Current diet order is heart/carb modified, patient is consuming approximately 100% of meals at this time. Labs and medications reviewed.   No nutrition interventions warranted at this time. If nutrition issues arise, please consult RD.   Leodis Rains, RDN, LDN  Clinical Nutrition

## 2023-02-22 NOTE — Progress Notes (Addendum)
Patient Name: Holly Spence Date of Encounter: 02/22/2023  Primary Cardiologist: Garwin Brothers, MD Electrophysiologist: Lanier Prude, MD  Interval Summary   Still with a raw cough. SOB continues to improve.   UOP remains brisk. Negative 4 L thus far.  Inpatient Medications    Scheduled Meds:  albuterol  2.5 mg Inhalation BID AC & HS   amiodarone  400 mg Oral BID   apixaban  5 mg Oral BID   furosemide  80 mg Intravenous BID   gabapentin  300 mg Oral TID   insulin aspart  0-20 Units Subcutaneous TID WC   insulin aspart  0-5 Units Subcutaneous QHS   liver oil-zinc oxide   Topical BID   metoprolol succinate  25 mg Oral Daily   mometasone-formoterol  2 puff Inhalation BID   And   umeclidinium bromide  1 puff Inhalation Daily   pantoprazole  40 mg Oral QAC breakfast   rOPINIRole  4 mg Oral QHS   rosuvastatin  10 mg Oral QHS   sodium chloride flush  3 mL Intravenous Q12H   Continuous Infusions:  sodium chloride     PRN Meds: sodium chloride, sodium chloride flush, traMADol   Vital Signs    Vitals:   02/22/23 0700 02/22/23 0731 02/22/23 0748 02/22/23 0800  BP:  137/76    Pulse: 70 80 79 68  Resp:  13    Temp:  98.3 F (36.8 C)    TempSrc:  Oral    SpO2: 96% 91% 92% 100%  Weight:      Height:        Intake/Output Summary (Last 24 hours) at 02/22/2023 0841 Last data filed at 02/22/2023 0800 Gross per 24 hour  Intake 1474.34 ml  Output 3025 ml  Net -1550.66 ml   Filed Weights   02/19/23 1613  Weight: (!) 145.2 kg    Physical Exam    GEN- The patient is well appearing, alert and oriented x 3 today.   Lungs- Diminished through out, + rhonchi that clear with cough.  Cardiac- Regular rate and rhythm, no murmurs, rubs or gallops GI- soft, NT, ND, + BS Extremities- no clubbing or cyanosis. No edema  Telemetry    NSR 60-80s, V pacing occasional A pacing (personally reviewed)  Hospital Course    Ms. Holly Spence is a 77 year old woman who is being admitted  for atrial fibrillation. She has a complex medical history that includes morbid obesity, hypertension, diabetes, permanent pacemaker in situ, atrial fibrillation/flutter   Assessment & Plan    Afib/ atrial flutter Recent increase in Afib burden via device alert Continue Eliquis, states no missed doses in the last 3 weeks. Continue amiodarone 400 mg BID  In NSR by tele and device interrogation.    CHA2DS2VASc score of 6 Continue eliquis 5 mg bid   Acute on chronic diastolic CHF EF 60-65% in June Continue IV lasix 80 mg BID today pending labs.  Strict I/Os. Negative 4 L so far. Will plan to transition to torsemide once felt to be euvolemic and will need to monitor labs and fluid status for at least 1 day.    DM2 Sliding scale DM coordinator appreciated   Productive cough AE COPD CXR with bronchitis vs pulm edema vs atypical infection Incentive Spirometer Nebulizer/MDI Will cover with ABx with hospitalization and recent cellulitis. Will discuss with pharmD.    For questions or updates, please contact [facility] Please consult [email or web address] for contact info under Cardiology/STEMI.  Signed, Holly Spence  , PA-C  02/22/2023, 8:41 AM

## 2023-02-22 NOTE — Plan of Care (Signed)

## 2023-02-22 NOTE — Care Management Important Message (Signed)
Important Message  Patient Details  Name: Holly Spence MRN: [id number] Date of Birth: [birth year]   Medicare Important Message Given:  Yes     Sherilyn Banker 02/22/2023, 3:45 PM

## 2023-02-23 DIAGNOSIS — I4819 Other persistent atrial fibrillation: Secondary | ICD-10-CM | POA: Diagnosis not present

## 2023-02-23 LAB — BASIC METABOLIC PANEL
Anion gap: 10 (ref 5–15)
BUN: 32 mg/dL — ABNORMAL HIGH (ref 8–23)
CO2: 32 mmol/L (ref 22–32)
Calcium: 8.5 mg/dL — ABNORMAL LOW (ref 8.9–10.3)
Chloride: 92 mmol/L — ABNORMAL LOW (ref 98–111)
Creatinine, Ser: 1.7 mg/dL — ABNORMAL HIGH (ref 0.44–1.00)
GFR, Estimated: 30 mL/min — ABNORMAL LOW (ref >60)
Glucose, Bld: 192 mg/dL — ABNORMAL HIGH (ref 70–99)
Potassium: 4.7 mmol/L (ref 3.5–5.1)
Sodium: 134 mmol/L — ABNORMAL LOW (ref 135–145)

## 2023-02-23 LAB — GLUCOSE, CAPILLARY
Glucose-Capillary: 173 mg/dL — ABNORMAL HIGH (ref 70–99)
Glucose-Capillary: 358 mg/dL — ABNORMAL HIGH (ref 70–99)
Glucose-Capillary: 257 mg/dL — ABNORMAL HIGH (ref 70–99)
Glucose-Capillary: 283 mg/dL — ABNORMAL HIGH (ref 70–99)

## 2023-02-23 MED ORDER — TORSEMIDE 20 MG PO TABS: 40.0000 mg | ORAL_TABLET | Freq: Every day | ORAL | Status: DC

## 2023-02-23 MED ORDER — TORSEMIDE 20 MG PO TABS: 40.0000 mg | ORAL_TABLET | Freq: Two times a day (BID) | ORAL | Status: DC

## 2023-02-23 MED ORDER — FUROSEMIDE 10 MG/ML IJ SOLN
80.0000 mg | Freq: Two times a day (BID) | INTRAMUSCULAR | Status: DC
  Administered 2023-02-24 – 2023-02-25 (×4): 80 mg via INTRAVENOUS
  Filled 2023-02-23 (×6): qty 8

## 2023-02-23 NOTE — Inpatient Diabetes Management (Signed)
Inpatient Diabetes Program Recommendations  AACE/ADA: New Consensus Statement on Inpatient Glycemic Control (2015)  Target Ranges:  Prepandial:   less than 140 mg/dL      Peak postprandial:   less than 180 mg/dL (1-2 hours)      Critically ill patients:  140 - 180 mg/dL   Lab Results  Component Value Date   GLUCAP 358 (H) 02/23/2023   HGBA1C 7.7 (H) 12/28/2022    Latest Reference Range & Units 02/22/23 12:00 02/22/23 16:04 02/22/23 20:39 02/23/23 07:20 02/23/23 11:54  Glucose-Capillary 70 - 99 mg/dL 086 (H) 578 (H) 469 (H) 173 (H) 358 (H)  (H): Data is abnormally high Review of Glycemic Control  Diabetes history: type 2 Outpatient Diabetes medications: 70/30 NPH-regular insulin 55-90 units BID Current orders for Inpatient glycemic control: Semglee 20 units at HS, Novolog 0-20 units correction scale TID, Novolog 0-5 units HS scale  Inpatient Diabetes Program Recommendations:   Noted that patient takes 70/30 mixed insulin at home 55-90 units BID. Spoke with diabetes coordinator on 8/13 and told her that she had been having low blood sugars at home. May need to decrease amount of 70/30 insulin at home. (Semglee 30.8 units = 55 units 70/30 insulin.) Recommend seeing how blood sugars do with the dosages of insulin while in the hospital.  Recommend adding Novolog 4 units TID with meals if patient eats at least 50% of meal and if blood sugars continue to be elevated.   Smith Mince RN BSN CDE Diabetes Coordinator Pager: [phone]  8am-5pm

## 2023-02-23 NOTE — Progress Notes (Signed)
Patient Name: MYLEKA MCBEE Date of Encounter: 02/23/2023  Primary Cardiologist: Garwin Brothers, MD Electrophysiologist: Lanier Prude, MD  Interval Summary   Continues to improve. Continues with brisk UOP. -7.4L total.  Cough mildly improved.   Inpatient Medications    Scheduled Meds:  albuterol  2.5 mg Inhalation BID AC & HS   amiodarone  400 mg Oral BID   apixaban  5 mg Oral BID   doxycycline  100 mg Oral Q12H   furosemide  80 mg Intravenous BID   gabapentin  300 mg Oral TID   insulin aspart  0-20 Units Subcutaneous TID WC   insulin aspart  0-5 Units Subcutaneous QHS   insulin glargine-yfgn  20 Units Subcutaneous Daily   liver oil-zinc oxide   Topical BID   metoprolol succinate  25 mg Oral Daily   mometasone-formoterol  2 puff Inhalation BID   And   umeclidinium bromide  1 puff Inhalation Daily   pantoprazole  40 mg Oral QAC breakfast   rOPINIRole  4 mg Oral QHS   rosuvastatin  10 mg Oral QHS   sodium chloride flush  3 mL Intravenous Q12H   Continuous Infusions:  sodium chloride     PRN Meds: sodium chloride, sodium chloride flush, traMADol   Vital Signs    Vitals:   02/22/23 1605 02/22/23 1958 02/23/23 0300 02/23/23 0722  BP: 115/62 113/60 106/71 (!) 136/50  Pulse: 70 69 63 72  Resp: (!) 24 16 18 18   Temp: 98 F (36.7 C) 98.1 F (36.7 C) 98.1 F (36.7 C) 98.1 F (36.7 C)  TempSrc: Oral Oral Oral Oral  SpO2: 95% 100% 97% 94%  Weight:      Height:        Intake/Output Summary (Last 24 hours) at 02/23/2023 0916 Last data filed at 02/23/2023 0405 Gross per 24 hour  Intake --  Output 3200 ml  Net -3200 ml   Filed Weights   02/19/23 1613  Weight: (!) 145.2 kg    Physical Exam    GEN- The patient is well appearing, alert and oriented x 3 today.   Lungs- Clear to ausculation bilaterally, normal work of breathing Cardiac- Regular rate and rhythm, no murmurs, rubs or gallops GI- soft, NT, ND, + BS Extremities- no clubbing or cyanosis. No  edema  Telemetry    NSR 60-70s, V pacing, occasional a pacing (personally reviewed)  Hospital Course    Ms. Shue is a 77 year old woman who is being admitted for atrial fibrillation. She has a complex medical history that includes morbid obesity, hypertension, diabetes, permanent pacemaker in situ, atrial fibrillation/flutter   Assessment & Plan    Afib/ atrial flutter Recent increase in Afib burden via device alert Continue Eliquis, states no missed doses in the last 3 weeks. Continue amiodarone 400 mg BID, taper on d/c.  In NSR by tele and device interrogation.    CHA2DS2VASc score of 6 Continue eliquis 5 mg bid   Acute on chronic diastolic CHF EF 60-65% in June Continue IV lasix 80 mg BID at least this am.  Potassium4.7 (08/16 0815) Cr 1.66 -> 1.63 -> 1.65 -> 1.70 Strict I/Os. Negative 7.4 L so far.   DM2 Sliding scale + basal DM coordinator appreciated   Productive cough AE COPD CXR with bronchitis vs pulm edema vs atypical infection Incentive Spirometer Nebulizer/MDI Started on doxycycline.   For questions or updates, please contact [facility] Please consult [email or web address] for contact info under Cardiology/STEMI.  Signed, Mariam Dollar  Mckaela Howley, PA-C  02/23/2023, 9:16 AM

## 2023-02-24 DIAGNOSIS — I4819 Other persistent atrial fibrillation: Secondary | ICD-10-CM | POA: Diagnosis not present

## 2023-02-24 LAB — GLUCOSE, CAPILLARY
Glucose-Capillary: 216 mg/dL — ABNORMAL HIGH (ref 70–99)
Glucose-Capillary: 295 mg/dL — ABNORMAL HIGH (ref 70–99)
Glucose-Capillary: 290 mg/dL — ABNORMAL HIGH (ref 70–99)
Glucose-Capillary: 212 mg/dL — ABNORMAL HIGH (ref 70–99)
Glucose-Capillary: 253 mg/dL — ABNORMAL HIGH (ref 70–99)

## 2023-02-24 LAB — BASIC METABOLIC PANEL
Anion gap: 10 (ref 5–15)
BUN: 39 mg/dL — ABNORMAL HIGH (ref 8–23)
CO2: 32 mmol/L (ref 22–32)
Calcium: 8.3 mg/dL — ABNORMAL LOW (ref 8.9–10.3)
Chloride: 92 mmol/L — ABNORMAL LOW (ref 98–111)
Creatinine, Ser: 1.76 mg/dL — ABNORMAL HIGH (ref 0.44–1.00)
GFR, Estimated: 29 mL/min — ABNORMAL LOW (ref >60)
Glucose, Bld: 219 mg/dL — ABNORMAL HIGH (ref 70–99)
Potassium: 4.5 mmol/L (ref 3.5–5.1)
Sodium: 134 mmol/L — ABNORMAL LOW (ref 135–145)

## 2023-02-24 NOTE — Progress Notes (Addendum)
Patient Name: Holly Spence Date of Encounter: 02/24/2023  Primary Cardiologist: Garwin Brothers, MD Electrophysiologist: Lanier Prude, MD   Hospital Course    Ms. Brehm is a 77 year old woman who is being admitted w  atrial fibrillation triggering CHF  QT precluded dofetilide and loading with amio . She has a complex medical history that includes morbid obesity, hypertension, diabetes, permanent pacemaker in situ,   EF 6/24 55-60%   diuretics   Interval Summary   Continues to improve with less edema, less shortness of breath.  Urinating briskly  Inpatient Medications    Scheduled Meds:  albuterol  2.5 mg Inhalation BID AC & HS   amiodarone  400 mg Oral BID   apixaban  5 mg Oral BID   doxycycline  100 mg Oral Q12H   furosemide  80 mg Intravenous BID   gabapentin  300 mg Oral TID   insulin aspart  0-20 Units Subcutaneous TID WC   insulin aspart  0-5 Units Subcutaneous QHS   insulin glargine-yfgn  20 Units Subcutaneous Daily   liver oil-zinc oxide   Topical BID   metoprolol succinate  25 mg Oral Daily   mometasone-formoterol  2 puff Inhalation BID   And   umeclidinium bromide  1 puff Inhalation Daily   pantoprazole  40 mg Oral QAC breakfast   rOPINIRole  4 mg Oral QHS   rosuvastatin  10 mg Oral QHS   sodium chloride flush  3 mL Intravenous Q12H   Continuous Infusions:  sodium chloride     PRN Meds: sodium chloride, sodium chloride flush, traMADol   Vital Signs    Vitals:   02/23/23 1948 02/23/23 2011 02/24/23 0352 02/24/23 0826  BP:  (!) 101/56 (!) 123/52   Pulse: 62 60 63 70  Resp: 18 18 18 20   Temp: 97.8 F (36.6 C) 98.4 F (36.9 C) 97.9 F (36.6 C)   TempSrc: Oral Oral Oral   SpO2: 94% 95% 96% 98%  Weight:      Height:        Intake/Output Summary (Last 24 hours) at 02/24/2023 1610 Last data filed at 02/24/2023 9604 Gross per 24 hour  Intake 360 ml  Output 1600 ml  Net -1240 ml   Cummulative net 12L almost    [employer]   02/19/23  1613  Weight: (!) 145.2 kg    Physical Exam    Well developed and morbidly obese in no acute distress HENT normal Neck supple  Clear prolonged expiratory phase Regular rate and rhythm, no murmurs or gallops Abd-soft with active BS No Clubbing cyanosis 1+ edema Skin-warm and dry A & Oriented  Grossly normal sensory and motor function   Telemetry    Appears to be to be atrial flutter with ventricular pacing.  Will get electrocardiogram     Assessment & Plan    Afib/ atrial flutter-persistent   CHA2DS2VASc score of 6 Continue eliquis 5 mg bid   Acute on chronic diastolic CHF   DM2   Productive cough AE COPD CXR with bronchitis vs pulm edema vs atypical infection Incentive Spirometer Nebulizer/MDI Started on doxycycline.   Cntinue amio 400 twice daily.  Continue diuretics intravenously x 24 more hours and plan to transition to oral  Will need to check electrocardiogram because I think she is actually atrial flutter and would anticipate outpatient cardioversion  Continue doxycycline for her bronchitis.  Morbidly obese.  Probably has sleep apnea.  Was diagnosed with this a decade or more ago.  worth considering outpatient sleep study  For questions or updates, please contact [facility] Please consult [email or web address] for contact info under Cardiology/STEMI.  Signed, Sherryl Manges, MD  02/24/2023, 9:03 AM

## 2023-02-25 DIAGNOSIS — I4819 Other persistent atrial fibrillation: Secondary | ICD-10-CM | POA: Diagnosis not present

## 2023-02-25 LAB — BASIC METABOLIC PANEL
Anion gap: 10 (ref 5–15)
BUN: 46 mg/dL — ABNORMAL HIGH (ref 8–23)
CO2: 30 mmol/L (ref 22–32)
Calcium: 8.3 mg/dL — ABNORMAL LOW (ref 8.9–10.3)
Chloride: 95 mmol/L — ABNORMAL LOW (ref 98–111)
Creatinine, Ser: 1.75 mg/dL — ABNORMAL HIGH (ref 0.44–1.00)
GFR, Estimated: 29 mL/min — ABNORMAL LOW (ref >60)
Glucose, Bld: 275 mg/dL — ABNORMAL HIGH (ref 70–99)
Potassium: 4.4 mmol/L (ref 3.5–5.1)
Sodium: 135 mmol/L (ref 135–145)

## 2023-02-25 LAB — GLUCOSE, CAPILLARY
Glucose-Capillary: 185 mg/dL — ABNORMAL HIGH (ref 70–99)
Glucose-Capillary: 293 mg/dL — ABNORMAL HIGH (ref 70–99)
Glucose-Capillary: 211 mg/dL — ABNORMAL HIGH (ref 70–99)
Glucose-Capillary: 230 mg/dL — ABNORMAL HIGH (ref 70–99)

## 2023-02-25 NOTE — Progress Notes (Addendum)
Patient Name: Holly Spence Date of Encounter: 02/25/2023  Primary Cardiologist: Garwin Brothers, MD Electrophysiologist: Lanier Prude, MD   Hospital Course    Holly Spence is a 77 year old woman who is being admitted w  atrial fibrillation triggering CHF  QT precluded dofetilide and loading with amio . She has a complex medical history that includes morbid obesity, hypertension, diabetes, permanent pacemaker in situ,   EF 6/24 55-60%     Interval Summary  Feels better this morning.  Less shortness of breath.  Inpatient Medications    Scheduled Meds:  albuterol  2.5 mg Inhalation BID AC & HS   amiodarone  400 mg Oral BID   apixaban  5 mg Oral BID   doxycycline  100 mg Oral Q12H   furosemide  80 mg Intravenous BID   gabapentin  300 mg Oral TID   insulin aspart  0-20 Units Subcutaneous TID WC   insulin aspart  0-5 Units Subcutaneous QHS   insulin glargine-yfgn  20 Units Subcutaneous Daily   liver oil-zinc oxide   Topical BID   metoprolol succinate  25 mg Oral Daily   mometasone-formoterol  2 puff Inhalation BID   And   umeclidinium bromide  1 puff Inhalation Daily   pantoprazole  40 mg Oral QAC breakfast   rOPINIRole  4 mg Oral QHS   rosuvastatin  10 mg Oral QHS   sodium chloride flush  3 mL Intravenous Q12H   Continuous Infusions:  sodium chloride     PRN Meds: sodium chloride, sodium chloride flush, traMADol   Vital Signs    Vitals:   02/25/23 0511 02/25/23 0638 02/25/23 0733 02/25/23 0806  BP:    123/73  Pulse: 62 73 72 71  Resp:   20   Temp:      TempSrc:      SpO2: 97% 90%    Weight:      Height:        Intake/Output Summary (Last 24 hours) at 02/25/2023 1059 Last data filed at 02/25/2023 0600 Gross per 24 hour  Intake 120 ml  Output 1025 ml  Net -905 ml   Cummulative net 12L+    Filed Weights   02/19/23 1613  Weight: (!) 145.2 kg    Physical Exam    Well developed and Morbidly obese in no acute distress HENT normal Neck supple with  JVP-flat Clear  Regular rate and rhythm, no  gallop No murmur Abd-soft with active BS No Clubbing cyanosis tr  edema Skin-warm and dry A & Oriented  Grossly normal sensory and motor function     Telemetry    Appears to be to be atrial flutter with ventricular pacing.  Will get electrocardiogram     Assessment & Plan    Afib/ atrial flutter-persistent  >amio initiated    CHA2DS2VASc score of 6   Acute on chronic diastolic CHF  Pacemaker-[employer]   DM2   Productive cough AE COPD   Started on doxycycline.  Continue the amiodarone 400 twice daily.  Will transition to oral diuretics today.  Suspect still in atrial flutter, will check the device today.  >>  Device interrogation demonstrates that she is in sinus rhythm--she has been lost to follow-up and her outputs were programmed to 3.5 V.  This was reprogrammed today.  Continue doxycycline  Outpatient sleep study  Perhaps home tomorrow   For questions or updates, please contact [facility] Please consult [email or web address] for contact info under Cardiology/STEMI.  Signed, Viviann Spare  Graciela Husbands, MD  02/25/2023, 10:59 AM

## 2023-02-26 DIAGNOSIS — I4819 Other persistent atrial fibrillation: Secondary | ICD-10-CM | POA: Diagnosis not present

## 2023-02-26 LAB — GLUCOSE, CAPILLARY
Glucose-Capillary: 207 mg/dL — ABNORMAL HIGH (ref 70–99)
Glucose-Capillary: 257 mg/dL — ABNORMAL HIGH (ref 70–99)
Glucose-Capillary: 250 mg/dL — ABNORMAL HIGH (ref 70–99)
Glucose-Capillary: 186 mg/dL — ABNORMAL HIGH (ref 70–99)

## 2023-02-26 LAB — BASIC METABOLIC PANEL
Anion gap: 12 (ref 5–15)
BUN: 51 mg/dL — ABNORMAL HIGH (ref 8–23)
CO2: 30 mmol/L (ref 22–32)
Calcium: 8.5 mg/dL — ABNORMAL LOW (ref 8.9–10.3)
Chloride: 93 mmol/L — ABNORMAL LOW (ref 98–111)
Creatinine, Ser: 1.75 mg/dL — ABNORMAL HIGH (ref 0.44–1.00)
GFR, Estimated: 29 mL/min — ABNORMAL LOW (ref >60)
Glucose, Bld: 214 mg/dL — ABNORMAL HIGH (ref 70–99)
Potassium: 4.2 mmol/L (ref 3.5–5.1)
Sodium: 135 mmol/L (ref 135–145)

## 2023-02-26 MED ORDER — WHITE PETROLATUM EX OINT
TOPICAL_OINTMENT | CUTANEOUS | Status: DC | PRN
  Filled 2023-02-26: qty 28.35

## 2023-02-26 MED ORDER — TORSEMIDE 20 MG PO TABS
40.0000 mg | ORAL_TABLET | Freq: Two times a day (BID) | ORAL | Status: DC
  Administered 2023-02-26 – 2023-02-27 (×3): 40 mg via ORAL
  Filled 2023-02-26 (×3): qty 2

## 2023-02-26 MED ORDER — ALBUTEROL SULFATE (2.5 MG/3ML) 0.083% IN NEBU
2.5000 mg | INHALATION_SOLUTION | Freq: Four times a day (QID) | RESPIRATORY_TRACT | Status: DC | PRN
  Filled 2023-02-26: qty 3

## 2023-02-26 NOTE — Progress Notes (Signed)
  Patient Name: Holly Spence Date of Encounter: 02/26/2023  Primary Cardiologist: Garwin Brothers, MD Electrophysiologist: Lanier Prude, MD   Hospital Course    Ms. Flink is a 77 year old woman who is being admitted w  atrial fibrillation triggering CHF  QT precluded dofetilide and loading with amio . She has a complex medical history that includes morbid obesity, hypertension, diabetes, permanent pacemaker in situ,   EF 6/24 55-60%     Interval Summary   C/w cough she reports since her COVID infection Jan 2023, less SOB, was hoping to go home  Inpatient Medications    Scheduled Meds:  amiodarone  400 mg Oral BID   apixaban  5 mg Oral BID   doxycycline  100 mg Oral Q12H   gabapentin  300 mg Oral TID   insulin aspart  0-20 Units Subcutaneous TID WC   insulin aspart  0-5 Units Subcutaneous QHS   insulin glargine-yfgn  20 Units Subcutaneous Daily   liver oil-zinc oxide   Topical BID   metoprolol succinate  25 mg Oral Daily   mometasone-formoterol  2 puff Inhalation BID   And   umeclidinium bromide  1 puff Inhalation Daily   pantoprazole  40 mg Oral QAC breakfast   rOPINIRole  4 mg Oral QHS   rosuvastatin  10 mg Oral QHS   sodium chloride flush  3 mL Intravenous Q12H   torsemide  40 mg Oral BID   Continuous Infusions:  sodium chloride     PRN Meds: sodium chloride, albuterol, sodium chloride flush, traMADol, white petrolatum   Vital Signs    Vitals:   02/26/23 0528 02/26/23 0756 02/26/23 0814 02/26/23 0843  BP: (!) 131/55 (!) 128/51    Pulse: 63 65  68  Resp: 19 18    Temp: 97.8 F (36.6 C) 97.8 F (36.6 C)    TempSrc: Oral Oral    SpO2: 96% 97% 96%   Weight:      Height:        Intake/Output Summary (Last 24 hours) at 02/26/2023 1128 Last data filed at 02/26/2023 9147 Gross per 24 hour  Intake 240 ml  Output 1900 ml  Net -1660 ml   Cummulative net 12L+    Filed Weights   02/19/23 1613  Weight: (!) 145.2 kg    Physical Exam    Well  developed and Morbidly obese in no acute distress HENT normal Neck supple with JVP-flat Lungs: crackles clear with cough, soft exp wheezes, moving air well Regular rate and rhythm, no  gallop No murmurs Abd-soft  Extrem: mild erythema, trace edema, chronic skin changes b/l, superficial scratches/wounds Skin-warm and dry A & Oriented  Grossly normal sensory and motor function   Telemetry    SR/ V paced   Assessment & Plan     Afib/ atrial flutter Recent increase in Afib burden via device alert CHA2DS2Vasc is at least 6, on Eliquis Continue amiodarone 400 mg BID    Acute on chronic diastolic CHF EF 60-65% in June Transition to torsemide today Strict I/Os.  Cumulatively neg -11,217ml    DM2 Sliding scale DM coordinator appreciated   Productive cough AE COPD CXR with bronchitis vs pulm edema vs atypical infection Incentive Spirometer Nebulizer/MDI Will cover with ABx with hospitalization and recent cellulitis.   Hopefully home tomorrow  For questions or updates, please contact [facility] Please consult [email or web address] for contact info under Cardiology/STEMI.  Signed, Sheilah Pigeon, PA-C  02/26/2023, 11:28 AM

## 2023-02-26 NOTE — Inpatient Diabetes Management (Signed)
Inpatient Diabetes Program Recommendations  AACE/ADA: New Consensus Statement on Inpatient Glycemic Control (2015)  Target Ranges:  Prepandial:   less than 140 mg/dL      Peak postprandial:   less than 180 mg/dL (1-2 hours)      Critically ill patients:  140 - 180 mg/dL   Lab Results  Component Value Date   GLUCAP 257 (H) 02/26/2023   HGBA1C 7.7 (H) 12/28/2022    Review of Glycemic Control  Diabetes history: type 2 Outpatient Diabetes medications: 70/30 NPH-regular insulin 55-90 units BID Current orders for Inpatient glycemic control: Semglee 20 units at HS, Novolog 0-20 units correction scale TID, Novolog 0-5 units HS scale    Inpatient Diabetes Program Recommendations:   Noted that blood sugars are still in the 200's.   Recommend adding Novolog 3 units TID with meals if patient eats at least 50% of meal and if blood sugars continue to be elevated.  Smith Mince RN BSN CDE Diabetes Coordinator Pager: [phone]  8am-5pm

## 2023-02-27 ENCOUNTER — Other Ambulatory Visit (HOSPITAL_COMMUNITY): Payer: Self-pay

## 2023-02-27 DIAGNOSIS — I4819 Other persistent atrial fibrillation: Secondary | ICD-10-CM | POA: Diagnosis not present

## 2023-02-27 LAB — BASIC METABOLIC PANEL
Anion gap: 12 (ref 5–15)
BUN: 55 mg/dL — ABNORMAL HIGH (ref 8–23)
CO2: 30 mmol/L (ref 22–32)
Calcium: 8.9 mg/dL (ref 8.9–10.3)
Chloride: 95 mmol/L — ABNORMAL LOW (ref 98–111)
Creatinine, Ser: 1.9 mg/dL — ABNORMAL HIGH (ref 0.44–1.00)
GFR, Estimated: 27 mL/min — ABNORMAL LOW (ref >60)
Glucose, Bld: 166 mg/dL — ABNORMAL HIGH (ref 70–99)
Potassium: 4.6 mmol/L (ref 3.5–5.1)
Sodium: 137 mmol/L (ref 135–145)

## 2023-02-27 LAB — GLUCOSE, CAPILLARY
Glucose-Capillary: 285 mg/dL — ABNORMAL HIGH (ref 70–99)
Glucose-Capillary: 302 mg/dL — ABNORMAL HIGH (ref 70–99)

## 2023-02-27 MED ORDER — AMIODARONE HCL 200 MG PO TABS
200.0000 mg | ORAL_TABLET | Freq: Two times a day (BID) | ORAL | 0 refills | Status: DC
Start: 2023-02-27 — End: 2023-02-27
  Filled 2023-02-27: qty 28, 14d supply, fill #0

## 2023-02-27 MED ORDER — TORSEMIDE 20 MG PO TABS
40.0000 mg | ORAL_TABLET | Freq: Every day | ORAL | 5 refills | Status: DC
  Filled 2023-02-27: qty 60, 30d supply, fill #0

## 2023-02-27 MED ORDER — AMIODARONE HCL 200 MG PO TABS: 200.0000 mg | ORAL_TABLET | Freq: Every day | ORAL | 5 refills | Status: DC

## 2023-02-27 MED ORDER — AMIODARONE HCL 200 MG PO TABS
ORAL_TABLET | ORAL | 0 refills | Status: DC
  Filled 2023-02-27: qty 58, 44d supply, fill #0

## 2023-02-27 NOTE — TOC Transition Note (Signed)
Transition of Care [facility]) - CM/SW Discharge Note   Patient Details  Name: Holly Spence MRN: [id number] Date of Birth: 05-Apr-1946  Transition of [facility]) CM/SW Contact:  Leone Haven, RN Phone Number: 02/27/2023, 10:28 AM   Clinical Narrative:    Patient is for dc today NCM notified, [facility] or patient ' s dc today, she has transportation.  TOC to fill meds.   Final next level of care: Home w Home Health Services Barriers to Discharge: Continued Medical Work up   Patient Goals and CMS Choice      Discharge Placement                         Discharge Plan and Services Additional resources added to the After Visit Summary for   In-house Referral: NA Discharge Planning Services: CM Consult Post Acute Care Choice: Home Health, Resumption of Svcs/PTA Provider                    HH Arranged: PT, OT HH Agency: [facility] Date [facility] Agency Contacted: 02/20/23 Time HH Agency Contacted: 0930 Representative spoke with at [facility] Agency: Merry Proud  Social Determinants of Health (SDOH) Interventions SDOH Screenings   Food Insecurity: No Food Insecurity (02/19/2023)  Housing: Medium Risk (02/19/2023)  Transportation Needs: No Transportation Needs (02/19/2023)  Utilities: Not At Risk (02/19/2023)  Depression (PHQ2-9): Low Risk  (02/03/2019)  Social Connections: Unknown (11/22/2021)   [facility] from [facility], [facility]  Tobacco Use: Medium Risk (02/19/2023)     Readmission Risk Interventions    12/30/2022    3:47 PM  Readmission Risk Prevention Plan  Transportation Screening Complete  PCP or Specialist Appt within 5-7 Days Complete  Home Care Screening Complete  Medication Review (RN CM) Complete

## 2023-02-27 NOTE — Care Management Important Message (Signed)
Important Message  Patient Details  Name: Holly Spence MRN: [id number] Date of Birth: 1945-08-19   Medicare Important Message Given:  Yes     Renie Ora 02/27/2023, 11:20 AM

## 2023-02-27 NOTE — Discharge Summary (Signed)
[facility] SUMMARY    Patient ID: Holly Spence,  MRN: [id number], DOB/AGE: [birth year] 77 y.o.  Admit date: 02/19/2023 Discharge date: 02/27/2023  Primary Care Physician: [facility]  Primary Cardiologist: Dr. Tomie China Electrophysiologist: Dr. Lalla Brothers  Primary Discharge Diagnosis:  Persistent AFib CHA2DS2Vasc is 6, on Eliquis Acute on chronic diastolic CHF Diuresed and volume stable bronchitis Completed course of doxycyline here  Secondary Discharge Diagnosis:  COPD DM Advised close follow up with her PMD June A1c was 7.7 Morbid obesity Hx of bariatric sx PPM  Allergies  Allergen Reactions   Penicillins Hives and Other (See Comments)    At injection site only- hives   Sulfa Antibiotics Hives   Erythromycin Other (See Comments)    Unsure    Azithromycin Rash and Other (See Comments)    Broke out the legs   Ciprofloxacin Hcl Other (See Comments)    Abdominal Pain     Procedures This Admission:  none  Brief HPI: Holly Spence is a 77 y.o. female referred by the AFib c linic with plans for Tikosyn though QTc was too long and started on amiodarone, she was found in acute volume OL felt triggered by her AFib, admitted for diuresis, and covered with doxycycline for her cough/possible bronchitis   Hospital Course:  The patient was admitted started on IV lasix and diuresed for several days, with marked clinical improvement.  She converted to SR on drug.  Intakes appear inaccurate, though by charting negative 13,156ml.   The patient feels well, much better and very much wants to go home.  Her Creat has creeped up, plan to go home on once daily torsemide. She will see the AFSib clinic later this week for evaluation and BMET. She denies any CP/SOB.  Cough is as well better.  She was examined by Dr. Lalla Brothers and considered stable for discharge to home.   Amiodarone She has completed a week of 400mg  BID Home on 200mg  BID for 2 weeks then 200mg   daily  Stop home lasix Continue torsemide 40mg  daily Hold home lisinopril until after her labs this week at the AFib clinic, plan to resume if Creat is better/towards baseline  Advised 1 week follow up with her PMD for f/u on her bronchitis and DM  She had some nocturnal desaturations, has CPAP at home but had not been using since she lost some weight after her bariatric surgery, I have advised she resume her sleep apnea therapy and follow up with them as well    Physical Exam: Vitals:   02/26/23 2041 02/27/23 0410 02/27/23 0801 02/27/23 0827  BP: (!) 121/57 (!) 129/58  (!) 113/54  Pulse: 66 63 68   Resp: 18 18 18    Temp: 97.9 F (36.6 C) 97.7 F (36.5 C)  (!) 97.4 F (36.3 C)  TempSrc: Oral Oral  Oral  SpO2: 97% 97% 100%   Weight:      Height:        GEN- The patient is well appearing, alert and oriented x 3 today.   HEENT: normocephalic, atraumatic; sclera clear, conjunctiva pink; hearing intact; oropharynx clear; neck supple, no JVP Lungs- soft crackles that clear with cough, moving air well, normal work of breathing.  No wheezes, rales, rhonchi Heart- RRR, no murmurs, rubs or gallops, PMI not laterally displaced GI- soft, non-tender, non-distended Extremities- no clubbing, cyanosis, or edema MS- no significant deformity or atrophy Skin- warm and dry, no rash or lesion  Psych- euthymic mood,  full affect Neuro- no gross deficits   Labs:   Lab Results  Component Value Date   WBC 9.4 12/30/2022   HGB 12.1 12/30/2022   HCT 40.6 12/30/2022   MCV 89.2 12/30/2022   PLT 242 12/30/2022    Recent Labs  Lab 02/27/23 0010  NA 137  K 4.6  CL 95*  CO2 30  BUN 55*  CREATININE 1.90*  CALCIUM 8.9  GLUCOSE 166*    Discharge Medications:  Allergies as of 02/27/2023       Reactions   Penicillins Hives, Other (See Comments)   At injection site only- hives   Sulfa Antibiotics Hives   Erythromycin Other (See Comments)   Unsure    Azithromycin Rash, Other (See  Comments)   Broke out the legs   Ciprofloxacin Hcl Other (See Comments)   Abdominal Pain        Medication List     STOP taking these medications    furosemide 40 MG tablet Commonly known as: LASIX       TAKE these medications    albuterol 108 (90 Base) MCG/ACT inhaler Commonly known as: VENTOLIN HFA Inhale 2 puffs into the lungs every 6 (six) hours as needed for wheezing or shortness of breath.   amiodarone 200 MG tablet Commonly known as: Pacerone Take 1 tablet (200 mg total) by mouth 2 (two) times daily for 14 days.   amiodarone 200 MG tablet Commonly known as: Pacerone Take 1 tablet (200 mg total) by mouth daily. Start taking on: March 14, 2023   apixaban 5 MG Tabs tablet Commonly known as: Eliquis Take 1 tablet (5 mg total) by mouth 2 (two) times daily.   Breztri Aerosphere 160-9-4.8 MCG/ACT Aero Generic drug: Budeson-Glycopyrrol-Formoterol Inhale 2 puffs into the lungs 2 (two) times daily.   cyanocobalamin 1000 MCG tablet Take 1,000 mcg by mouth daily.   diclofenac sodium 1 % Gel Commonly known as: VOLTAREN Apply 2-4 g topically every 6 (six) hours as needed (pain).   ferrous sulfate 325 (65 FE) MG EC tablet Take 325 mg by mouth daily with breakfast.   gabapentin 300 MG capsule Commonly known as: NEURONTIN Take 300 mg by mouth 3 (three) times daily.   insulin NPH-regular Human (70-30) 100 UNIT/ML injection Inject 55-90 Units into the skin 2 (two) times daily with a meal. Per sliding scale   lisinopril 20 MG tablet Commonly known as: ZESTRIL Take 1 tablet (20 mg total) by mouth daily. Notes to patient: Do not resume until advised to after or at your AFib clinic visit later this week   liver oil-zinc oxide 40 % ointment Commonly known as: DESITIN Apply topically 2 (two) times daily. What changed:  how much to take when to take this reasons to take this   metoprolol succinate 25 MG 24 hr tablet Commonly known as: Toprol XL Take 1 tablet  (25 mg total) by mouth daily.   pantoprazole 40 MG tablet Commonly known as: PROTONIX Take 40 mg by mouth daily before breakfast.   rOPINIRole 4 MG tablet Commonly known as: REQUIP Take 4 mg by mouth at bedtime.   rosuvastatin 10 MG tablet Commonly known as: CRESTOR Take 10 mg by mouth at bedtime.   Torsemide 40 MG Tabs Take 40 mg by mouth daily.   traMADol 50 MG tablet Commonly known as: ULTRAM Take 100 mg by mouth in the morning and at bedtime.        Disposition: Home Discharge Instructions     Diet -  low sodium heart healthy   Complete by: As directed    Increase activity slowly   Complete by: As directed    No wound care   Complete by: As directed        Follow-up Information     [facility] Follow up.   Specialty: Home Health Services Why: Agency will contact you to set up apt times to resume therapies Contact information: [address] [place] [address] [phone]         [facility] Follow up in 1 week(s).   Why: Please see your primary provider for follow up post hospitalization and management of your diabetes Contact information: [address] [place] [address] [phone]                 Duration of Discharge Encounter: Greater than 30 minutes including physician time.  Norma Fredrickson, PA-C 02/27/2023 10:36 AM

## 2023-03-02 ENCOUNTER — Ambulatory Visit (HOSPITAL_COMMUNITY): Payer: Medicare Other | Admitting: Internal Medicine

## 2023-03-05 ENCOUNTER — Ambulatory Visit (HOSPITAL_COMMUNITY)
Admit: 2023-03-05 | Discharge: 2023-03-05 | Disposition: A | Discharge status: Home / Self Care | Payer: Medicare Other | Attending: Internal Medicine | Admitting: Internal Medicine

## 2023-03-05 VITALS — BP 132/68 | HR 71 | Ht 63.0 in | Wt 295.4 lb

## 2023-03-05 DIAGNOSIS — I4819 Other persistent atrial fibrillation: Secondary | ICD-10-CM | POA: Insufficient documentation

## 2023-03-05 DIAGNOSIS — I700 Atherosclerosis of aorta: Secondary | ICD-10-CM | POA: Diagnosis not present

## 2023-03-05 DIAGNOSIS — I5033 Acute on chronic diastolic (congestive) heart failure: Secondary | ICD-10-CM | POA: Diagnosis not present

## 2023-03-05 DIAGNOSIS — Z950 Presence of cardiac pacemaker: Secondary | ICD-10-CM | POA: Insufficient documentation

## 2023-03-05 DIAGNOSIS — I110 Hypertensive heart disease with heart failure: Secondary | ICD-10-CM | POA: Diagnosis not present

## 2023-03-05 DIAGNOSIS — D6869 Other thrombophilia: Secondary | ICD-10-CM

## 2023-03-05 DIAGNOSIS — E119 Type 2 diabetes mellitus without complications: Secondary | ICD-10-CM | POA: Insufficient documentation

## 2023-03-05 DIAGNOSIS — I4892 Unspecified atrial flutter: Secondary | ICD-10-CM | POA: Insufficient documentation

## 2023-03-05 DIAGNOSIS — Z7901 Long term (current) use of anticoagulants: Secondary | ICD-10-CM | POA: Insufficient documentation

## 2023-03-05 DIAGNOSIS — Z794 Long term (current) use of insulin: Secondary | ICD-10-CM | POA: Insufficient documentation

## 2023-03-05 DIAGNOSIS — Z9884 Bariatric surgery status: Secondary | ICD-10-CM | POA: Insufficient documentation

## 2023-03-05 DIAGNOSIS — Z79899 Other long term (current) drug therapy: Secondary | ICD-10-CM | POA: Diagnosis not present

## 2023-03-05 DIAGNOSIS — I2510 Atherosclerotic heart disease of native coronary artery without angina pectoris: Secondary | ICD-10-CM | POA: Diagnosis not present

## 2023-03-05 LAB — BASIC METABOLIC PANEL
Anion gap: 8 (ref 5–15)
BUN: 34 mg/dL — ABNORMAL HIGH (ref 8–23)
CO2: 28 mmol/L (ref 22–32)
Calcium: 8.6 mg/dL — ABNORMAL LOW (ref 8.9–10.3)
Chloride: 100 mmol/L (ref 98–111)
Creatinine, Ser: 1.56 mg/dL — ABNORMAL HIGH (ref 0.44–1.00)
GFR, Estimated: 34 mL/min — ABNORMAL LOW (ref >60)
Glucose, Bld: 170 mg/dL — ABNORMAL HIGH (ref 70–99)
Potassium: 3.8 mmol/L (ref 3.5–5.1)
Sodium: 136 mmol/L (ref 135–145)

## 2023-03-05 MED ORDER — TORSEMIDE 20 MG PO TABS: 60.0000 mg | ORAL_TABLET | Freq: Every day | ORAL | 5 refills | Status: DC

## 2023-03-05 MED ORDER — AMIODARONE HCL 200 MG PO TABS: 200.0000 mg | ORAL_TABLET | Freq: Every day | ORAL | 3 refills | Status: DC

## 2023-03-05 NOTE — Addendum Note (Signed)
Encounter addended by: Shona Simpson, RN on: 03/05/2023 3:38 PM  Actions taken: Pharmacy for encounter modified, Order list changed, Clinical Note Signed

## 2023-03-05 NOTE — Progress Notes (Signed)
Primary Care Physician: [facility] Referring Physician: Dr. Delila Pereyra is a 77 y.o. female with a h/o morbid obesity, aortic atherosclerosis and coronary artery calcifications on chest CT 2021, HTN, PPM, DM, that was referred by the device clinic for new onset afib since 12/27/20. Raynelle Fanning today shows atrial  flutter with v rates controlled. She has been aware of a flutter in her chest for a while. She is basically w/c bound 2/2 to obesity and trouble ambulating. She had gastric sleeve in the past and lost down to 250 now at 300 lbs, heaviest weight was 350 lbs. She has not on anticoagulation. She states a bleeding ulcer a couple of years past but it was felt  to  be 2/2 Mobic. Pt is no longer taking and has not has any further bleeding. CHA2DS2VASc score is 6.   F/u in afib clinic, 01/12/21. She remains in afib, rate controlled. She has missed several of her Eliquis tabs but her daughter has set an alarm on pt's phone to alert her. We discussed a cardioversion again but she states that she does not feel the  afib but at this point she does not feel driven to undertake this.    F/u in afib clinic, 01/27/21. Pt returns still in afib, v paced and rate controlled.  She describes that she had some PND, orthopnea. Weight is up 5 lbs today. She does take lasix prn, she has not taken this week. On last visit, she voiced concern that she did not want to undergo cardioversion but with her latest symptoms, she will proceed with same. She has not missed any anticoagulation since before 7/6. She has an alarm set on her phone now to remind her.    On follow up, 01/08/23. Device alert on 6/13 for ongoing Afib since 6/4; Afib burden 5.2% with good ventricular rate control. No missed doses of Eliquis. She is not sure if she can feel when she is in Afib.  On follow up 02/19/23. Patient is here today for Tikosyn admission. No benadryl usage. She has not taken lisinopril/hydrochlorothiazide in  the past several days. No new medications since pharmacist review. No missed doses of Eliquis.  On follow up 03/05/23. Patient is s/p Tikosyn admission 8/12-20/24 but Qtc too long so started on amiodarone load. Diuresed during hospital stay for significant acute on chronic diastolic CHF. She was covered with doxycycline for bronchitis while inpatient. She completed amiodarone 400 mg BID and plan to take 200 mg BID x 2 weeks then 200 mg daily. She has gained 5.5 lbs since hospital discharge. No missed doses of Eliquis.   Today, she denies symptoms of palpitations, chest pain, shortness of breath, orthopnea, PND, lower extremity edema, dizziness, presyncope, syncope, or neurologic sequela. The patient is tolerating medications without difficulties and is otherwise without complaint today.   Past Medical History:  Diagnosis Date   Allergic rhinitis 09/20/2015   Arthritis    Ascending aorta dilation (HCC) 12/09/2018   Back pain    Benign essential hypertension 09/20/2015   Bruises easily    Complete heart block (HCC) 06/11/2020   Contact lens/glasses fitting    Cough    Depression 02/08/2019   Diabetes mellitus due to underlying condition with unspecified complications (HCC) 11/10/2015   Duodenal ulcer 02/08/2019   Fatigue    Gastroesophageal reflux disease 09/20/2015   GI bleeding 06/27/2019   Hx of laparoscopic gastric banding 01/31/2011   Surgery: 03/22/10  Hyperlipidemia    Insulin long-term use (HCC) 09/20/2015   Melena 02/08/2019   Moderate asthma 02/08/2019   Palpitations 11/10/2015   Poor circulation    Right renal mass 02/08/2019   Sinus problem    Sleep apnea    SOB (shortness of breath)    Symptomatic bradycardia 06/11/2020   Uncontrolled type 2 diabetes mellitus without complication, with long-term current use of insulin 09/20/2015   Past Surgical History:  Procedure Laterality Date   CATARACT EXTRACTION     confirm date with patient   CHOLECYSTECTOMY  1987   EYE SURGERY  2003   retina    HERNIA REPAIR     LAPAROSCOPIC GASTRIC BANDING  03/22/10   PACEMAKER IMPLANT N/A 06/11/2020   Procedure: PACEMAKER IMPLANT;  Surgeon: Lanier Prude, MD;  Location: [facility] INVASIVE CV LAB;  Service: Cardiovascular;  Laterality: N/A;    Current Outpatient Medications  Medication Sig Dispense Refill   albuterol (VENTOLIN HFA) 108 (90 Base) MCG/ACT inhaler Inhale 2 puffs into the lungs every 6 (six) hours as needed for wheezing or shortness of breath.      amiodarone (PACERONE) 200 MG tablet Take 1 tablet (200 mg total) by mouth 2 (two) times daily for 14 days, THEN 1 tablet (200 mg total) daily. 58 tablet 0   apixaban (ELIQUIS) 5 MG TABS tablet Take 1 tablet (5 mg total) by mouth 2 (two) times daily. 28 tablet 0   Budeson-Glycopyrrol-Formoterol (BREZTRI AEROSPHERE) 160-9-4.8 MCG/ACT AERO Inhale 2 puffs into the lungs 2 (two) times daily.     cyanocobalamin 1000 MCG tablet Take 1,000 mcg by mouth daily.     diclofenac sodium (VOLTAREN) 1 % GEL Apply 2-4 g topically every 6 (six) hours as needed (pain).     ferrous sulfate 325 (65 FE) MG EC tablet Take 325 mg by mouth daily with breakfast.     gabapentin (NEURONTIN) 300 MG capsule Take 300 mg by mouth 3 (three) times daily.     insulin NPH-regular Human (70-30) 100 UNIT/ML injection Inject 55-90 Units into the skin 2 (two) times daily with a meal. Per sliding scale     liver oil-zinc oxide (DESITIN) 40 % ointment Apply topically 2 (two) times daily. (Patient taking differently: Apply 1 Application topically daily as needed for irritation.) 56.7 g 0   metoprolol succinate (TOPROL XL) 25 MG 24 hr tablet Take 1 tablet (25 mg total) by mouth daily. 90 tablet 3   pantoprazole (PROTONIX) 40 MG tablet Take 40 mg by mouth daily before breakfast.     rOPINIRole (REQUIP) 4 MG tablet Take 4 mg by mouth at bedtime.      rosuvastatin (CRESTOR) 10 MG tablet Take 10 mg by mouth at bedtime.     torsemide (DEMADEX) 20 MG tablet Take 2 tablets (40 mg total) by mouth  daily. 60 tablet 5   traMADol (ULTRAM) 50 MG tablet Take 100 mg by mouth in the morning and at bedtime.     No current facility-administered medications for this encounter.    Allergies  Allergen Reactions   Penicillins Hives and Other (See Comments)    At injection site only- hives   Sulfa Antibiotics Hives   Erythromycin Other (See Comments)    Unsure    Azithromycin Rash and Other (See Comments)    Broke out the legs   Ciprofloxacin Hcl Other (See Comments)    Abdominal Pain   ROS- All systems are reviewed and negative except as per the HPI above  Physical Exam: Vitals:   03/05/23 1455  BP: 132/68  Pulse: 71  Weight: 134 kg  Height: 5\' 3"  (1.6 m)     Wt Readings from Last 3 Encounters:  03/05/23 134 kg  02/19/23 (!) 145.2 kg  02/19/23 (!) 144.9 kg    Labs: Lab Results  Component Value Date   NA 137 02/27/2023   K 4.6 02/27/2023   CL 95 (L) 02/27/2023   CO2 30 02/27/2023   GLUCOSE 166 (H) 02/27/2023   BUN 55 (H) 02/27/2023   CREATININE 1.90 (H) 02/27/2023   CALCIUM 8.9 02/27/2023   MG 1.8 02/22/2023   Lab Results  Component Value Date   INR 1.0 07/15/2022   No results found for: "CHOL", "HDL", "LDLCALC", "TRIG"  GEN- The patient is well appearing, alert and oriented x 3 today.  Morbidly obese. Neck - no JVD or carotid bruit noted Lungs- Clear to ausculation bilaterally, normal work of breathing Heart- Regular rate and rhythm, no murmurs, rubs or gallops, PMI not laterally displaced Extremities- no clubbing, cyanosis. Mild edema noted bilateral ankles/feet.  Skin - no rash or ecchymosis noted   EKG- Vent. rate 71 BPM PR interval 256 ms QRS duration 170 ms QT/QTcB 468/508 ms P-R-T axes 13 155 -10 Atrial-sensed ventricular-paced rhythm with prolonged AV conduction Abnormal ECG When compared with ECG of 24-Feb-2023 11:12, PREVIOUS ECG IS PRESENT  Echo- 12/30/22:  1. Left ventricular ejection fraction, by estimation, is 55 to 60%. The  left  ventricle has normal function. The left ventricle has no regional  wall motion abnormalities. There is mild left ventricular hypertrophy.  Left ventricular diastolic parameters  are indeterminate.   2. Right ventricular systolic function is normal. The right ventricular  size is mildly enlarged. There is moderately elevated pulmonary artery  systolic pressure. The estimated right ventricular systolic pressure is  47.7 mmHg.   3. The mitral valve is degenerative. Trivial mitral valve regurgitation.  No evidence of mitral stenosis. Moderate mitral annular calcification.   4. The aortic valve was not well visualized. Aortic valve regurgitation  is not visualized. Aortic valve sclerosis is present, with no evidence of  aortic valve stenosis.   5. Aortic dilatation noted. There is mild dilatation of the ascending  aorta, measuring 41 mm.   6. The inferior vena cava is dilated in size with <50% respiratory  variability, suggesting right atrial pressure of 15 mmHg.   Assessment and Plan:   1. Afib/ atrial flutter S/p Tikosyn admission 8/12-20/24; Qtc too long and placed on amiodarone with diuresis due to acute on chronic diastolic CHF  She is currently in NSR. Bmet obtained today.   Continue amiodarone load of 200 mg BID x 2 weeks then transition to 200 mg daily.   Can resume lisinopril if Bmet shows improved creatinine. Increase torsemide to 60 mg daily. Advised to follow up with Cardiology next week if continues with weight gain.   2. CHA2DS2VASc score of 6 Continue  eliquis 5 mg bid   F/u as scheduled with Otilio Saber, PA-C.   [place] Bells, PA-C Afib Clinic [facility] [address] [place], [place] [address] [phone]

## 2023-03-05 NOTE — Patient Instructions (Addendum)
Increase toresmide to 60mg  daily - if still continuing to have weight gain after increase call Dr Crisoforo [place] office for further management.   On September 3rd decrease amiodarone to 200mg  once a day

## 2023-03-13 LAB — CUP PACEART REMOTE DEVICE CHECK
Battery Remaining Longevity: 73 mo
Battery Remaining Percentage: 58 %
Battery Voltage: 2.99 V
Brady Statistic AP VP Percent: 4.4 %
Brady Statistic AP VS Percent: 1 %
Brady Statistic AS VP Percent: 95 %
Brady Statistic AS VS Percent: 1 %
Brady Statistic RA Percent Paced: 4.3 %
Brady Statistic RV Percent Paced: 99 %
Date Time Interrogation Session: 20240902020014
Implantable Lead Connection Status: 753985
Implantable Lead Connection Status: 753985
Implantable Lead Implant Date: 20211203
Implantable Lead Implant Date: 20211203
Implantable Lead Location: 753859
Implantable Lead Location: 753860
Implantable Pulse Generator Implant Date: 20211203
Lead Channel Impedance Value: 360 Ohm
Lead Channel Impedance Value: 640 Ohm
Lead Channel Pacing Threshold Amplitude: 0.5 V
Lead Channel Pacing Threshold Amplitude: 0.625 V
Lead Channel Pacing Threshold Pulse Width: 0.5 ms
Lead Channel Pacing Threshold Pulse Width: 0.5 ms
Lead Channel Sensing Intrinsic Amplitude: 0.5 mV
Lead Channel Sensing Intrinsic Amplitude: 5.8 mV
Lead Channel Setting Pacing Amplitude: 0.75 V
Lead Channel Setting Pacing Amplitude: 1.625
Lead Channel Setting Pacing Pulse Width: 0.5 ms
Lead Channel Setting Sensing Sensitivity: 2 mV
Pulse Gen Model: 2272
Pulse Gen Serial Number: 3879909

## 2023-03-14 ENCOUNTER — Ambulatory Visit (INDEPENDENT_AMBULATORY_CARE_PROVIDER_SITE_OTHER): Payer: Medicare Other

## 2023-03-14 DIAGNOSIS — I442 Atrioventricular block, complete: Secondary | ICD-10-CM

## 2023-03-26 ENCOUNTER — Other Ambulatory Visit (HOSPITAL_COMMUNITY): Payer: Self-pay

## 2023-03-26 MED ORDER — AMIODARONE HCL 200 MG PO TABS: 200.0000 mg | ORAL_TABLET | Freq: Every day | ORAL | 3 refills | Status: DC

## 2023-03-27 NOTE — Progress Notes (Signed)
Remote pacemaker transmission.   

## 2023-03-28 ENCOUNTER — Ambulatory Visit: Payer: Medicare Other | Admitting: Student

## 2023-04-12 ENCOUNTER — Telehealth: Payer: Self-pay | Admitting: Cardiology

## 2023-04-12 NOTE — Telephone Encounter (Signed)
Patient calling the office for samples of medication:   1.  What medication and dosage are you requesting samples for? apixaban (ELIQUIS) 5 MG TABS tablet  2.  Are you currently out of this medication? Yes    

## 2023-04-12 NOTE — Telephone Encounter (Signed)
Spoke with pt and advised that we do not have any samples of Eliquis. She stated that another clinic has some for her and she will get those. She had no other questions.

## 2023-05-11 ENCOUNTER — Other Ambulatory Visit (HOSPITAL_COMMUNITY): Payer: Self-pay

## 2023-05-11 MED ORDER — APIXABAN 5 MG PO TABS: 5.0000 mg | ORAL_TABLET | Freq: Two times a day (BID) | ORAL | Status: DC

## 2023-05-18 ENCOUNTER — Other Ambulatory Visit (HOSPITAL_COMMUNITY): Payer: Self-pay

## 2023-05-18 MED ORDER — APIXABAN 5 MG PO TABS: 5.0000 mg | ORAL_TABLET | Freq: Two times a day (BID) | ORAL | Status: DC

## 2023-06-11 LAB — CUP PACEART REMOTE DEVICE CHECK
Battery Remaining Longevity: 70 mo
Battery Remaining Percentage: 56 %
Battery Voltage: 2.99 V
Brady Statistic AP VP Percent: 8.4 %
Brady Statistic AP VS Percent: 1 %
Brady Statistic AS VP Percent: 91 %
Brady Statistic AS VS Percent: 1 %
Brady Statistic RA Percent Paced: 8.2 %
Brady Statistic RV Percent Paced: 99 %
Date Time Interrogation Session: 20241202020014
Implantable Lead Connection Status: 753985
Implantable Lead Connection Status: 753985
Implantable Lead Implant Date: 20211203
Implantable Lead Implant Date: 20211203
Implantable Lead Location: 753859
Implantable Lead Location: 753860
Implantable Pulse Generator Implant Date: 20211203
Lead Channel Impedance Value: 380 Ohm
Lead Channel Impedance Value: 600 Ohm
Lead Channel Pacing Threshold Amplitude: 0.625 V
Lead Channel Pacing Threshold Amplitude: 0.75 V
Lead Channel Pacing Threshold Pulse Width: 0.5 ms
Lead Channel Pacing Threshold Pulse Width: 0.5 ms
Lead Channel Sensing Intrinsic Amplitude: 0.4 mV
Lead Channel Sensing Intrinsic Amplitude: 8.1 mV
Lead Channel Setting Pacing Amplitude: 0.875
Lead Channel Setting Pacing Amplitude: 1.75 V
Lead Channel Setting Pacing Pulse Width: 0.5 ms
Lead Channel Setting Sensing Sensitivity: 2 mV
Pulse Gen Model: 2272
Pulse Gen Serial Number: 3879909

## 2023-06-13 ENCOUNTER — Ambulatory Visit (INDEPENDENT_AMBULATORY_CARE_PROVIDER_SITE_OTHER): Payer: Medicare Other

## 2023-06-13 DIAGNOSIS — I442 Atrioventricular block, complete: Secondary | ICD-10-CM | POA: Diagnosis not present

## 2023-09-12 ENCOUNTER — Ambulatory Visit (INDEPENDENT_AMBULATORY_CARE_PROVIDER_SITE_OTHER): Payer: Medicare Other

## 2023-09-12 DIAGNOSIS — I442 Atrioventricular block, complete: Secondary | ICD-10-CM

## 2023-09-13 LAB — CUP PACEART REMOTE DEVICE CHECK
Battery Remaining Longevity: 66 mo
Battery Remaining Percentage: 53 %
Battery Voltage: 2.99 V
Brady Statistic AP VP Percent: 16 %
Brady Statistic AP VS Percent: 1 %
Brady Statistic AS VP Percent: 84 %
Brady Statistic AS VS Percent: 1 %
Brady Statistic RA Percent Paced: 16 %
Brady Statistic RV Percent Paced: 99 %
Date Time Interrogation Session: 20250303020015
Implantable Lead Connection Status: 753985
Implantable Lead Connection Status: 753985
Implantable Lead Implant Date: 20211203
Implantable Lead Implant Date: 20211203
Implantable Lead Location: 753859
Implantable Lead Location: 753860
Implantable Pulse Generator Implant Date: 20211203
Lead Channel Impedance Value: 360 Ohm
Lead Channel Impedance Value: 550 Ohm
Lead Channel Pacing Threshold Amplitude: 0.625 V
Lead Channel Pacing Threshold Amplitude: 0.875 V
Lead Channel Pacing Threshold Pulse Width: 0.5 ms
Lead Channel Pacing Threshold Pulse Width: 0.5 ms
Lead Channel Sensing Intrinsic Amplitude: 0.3 mV
Lead Channel Sensing Intrinsic Amplitude: 4.2 mV
Lead Channel Setting Pacing Amplitude: 0.875
Lead Channel Setting Pacing Amplitude: 1.875
Lead Channel Setting Pacing Pulse Width: 0.5 ms
Lead Channel Setting Sensing Sensitivity: 2 mV
Pulse Gen Model: 2272
Pulse Gen Serial Number: 3879909

## 2023-09-20 ENCOUNTER — Encounter: Payer: Self-pay | Admitting: Cardiology

## 2023-09-25 LAB — LAB REPORT - SCANNED
A1c: 8.6
EGFR: 53.7
TSH: 0.09 — AB (ref 0.41–5.90)

## 2023-10-30 NOTE — Progress Notes (Signed)
 Remote pacemaker transmission.

## 2023-10-30 NOTE — Addendum Note (Signed)
 Addended by: Lott Rouleau A on: 10/30/2023 09:12 AM   Modules accepted: Orders

## 2023-12-11 LAB — CUP PACEART REMOTE DEVICE CHECK
Battery Remaining Longevity: 61 mo
Battery Remaining Percentage: 51 %
Battery Voltage: 2.99 V
Brady Statistic AP VP Percent: 14 %
Brady Statistic AP VS Percent: 1 %
Brady Statistic AS VP Percent: 85 %
Brady Statistic AS VS Percent: 1 %
Brady Statistic RA Percent Paced: 14 %
Brady Statistic RV Percent Paced: 99 %
Date Time Interrogation Session: 20250603020015
Implantable Lead Connection Status: 753985
Implantable Lead Connection Status: 753985
Implantable Lead Implant Date: 20211203
Implantable Lead Implant Date: 20211203
Implantable Lead Location: 753859
Implantable Lead Location: 753860
Implantable Pulse Generator Implant Date: 20211203
Lead Channel Impedance Value: 380 Ohm
Lead Channel Impedance Value: 480 Ohm
Lead Channel Pacing Threshold Amplitude: 0.75 V
Lead Channel Pacing Threshold Amplitude: 1 V
Lead Channel Pacing Threshold Pulse Width: 0.5 ms
Lead Channel Pacing Threshold Pulse Width: 0.5 ms
Lead Channel Sensing Intrinsic Amplitude: 0.3 mV
Lead Channel Sensing Intrinsic Amplitude: 3.3 mV
Lead Channel Setting Pacing Amplitude: 1.25 V
Lead Channel Setting Pacing Amplitude: 1.75 V
Lead Channel Setting Pacing Pulse Width: 0.5 ms
Lead Channel Setting Sensing Sensitivity: 2 mV
Pulse Gen Model: 2272
Pulse Gen Serial Number: 3879909

## 2023-12-12 ENCOUNTER — Ambulatory Visit (INDEPENDENT_AMBULATORY_CARE_PROVIDER_SITE_OTHER): Payer: Medicare Other

## 2023-12-12 DIAGNOSIS — I442 Atrioventricular block, complete: Secondary | ICD-10-CM | POA: Diagnosis not present

## 2023-12-15 ENCOUNTER — Ambulatory Visit: Payer: Self-pay | Admitting: Cardiology

## 2023-12-20 ENCOUNTER — Other Ambulatory Visit (HOSPITAL_COMMUNITY)

## 2023-12-20 ENCOUNTER — Emergency Department (HOSPITAL_COMMUNITY)

## 2023-12-20 ENCOUNTER — Encounter (HOSPITAL_COMMUNITY): Payer: Self-pay | Admitting: Family Medicine

## 2023-12-20 ENCOUNTER — Other Ambulatory Visit: Payer: Self-pay

## 2023-12-20 ENCOUNTER — Inpatient Hospital Stay (HOSPITAL_COMMUNITY)
Admission: EM | Admit: 2023-12-20 | Discharge: 2023-12-29 | DRG: 280 | Disposition: A | Discharge status: Skilled Nursing Facility | Attending: Internal Medicine | Admitting: Internal Medicine

## 2023-12-20 DIAGNOSIS — R7989 Other specified abnormal findings of blood chemistry: Secondary | ICD-10-CM | POA: Diagnosis not present

## 2023-12-20 DIAGNOSIS — G2581 Restless legs syndrome: Secondary | ICD-10-CM | POA: Diagnosis present

## 2023-12-20 DIAGNOSIS — R0602 Shortness of breath: Principal | ICD-10-CM

## 2023-12-20 DIAGNOSIS — Z950 Presence of cardiac pacemaker: Secondary | ICD-10-CM

## 2023-12-20 DIAGNOSIS — I5031 Acute diastolic (congestive) heart failure: Secondary | ICD-10-CM | POA: Diagnosis not present

## 2023-12-20 DIAGNOSIS — I130 Hypertensive heart and chronic kidney disease with heart failure and stage 1 through stage 4 chronic kidney disease, or unspecified chronic kidney disease: Secondary | ICD-10-CM | POA: Diagnosis present

## 2023-12-20 DIAGNOSIS — D696 Thrombocytopenia, unspecified: Secondary | ICD-10-CM | POA: Diagnosis present

## 2023-12-20 DIAGNOSIS — I5032 Chronic diastolic (congestive) heart failure: Secondary | ICD-10-CM | POA: Diagnosis present

## 2023-12-20 DIAGNOSIS — Z1152 Encounter for screening for COVID-19: Secondary | ICD-10-CM

## 2023-12-20 DIAGNOSIS — R7401 Elevation of levels of liver transaminase levels: Secondary | ICD-10-CM | POA: Diagnosis not present

## 2023-12-20 DIAGNOSIS — I2510 Atherosclerotic heart disease of native coronary artery without angina pectoris: Secondary | ICD-10-CM | POA: Diagnosis present

## 2023-12-20 DIAGNOSIS — Z79899 Other long term (current) drug therapy: Secondary | ICD-10-CM

## 2023-12-20 DIAGNOSIS — E662 Morbid (severe) obesity with alveolar hypoventilation: Secondary | ICD-10-CM | POA: Diagnosis present

## 2023-12-20 DIAGNOSIS — Z882 Allergy status to sulfonamides status: Secondary | ICD-10-CM

## 2023-12-20 DIAGNOSIS — M1990 Unspecified osteoarthritis, unspecified site: Secondary | ICD-10-CM | POA: Diagnosis present

## 2023-12-20 DIAGNOSIS — I442 Atrioventricular block, complete: Secondary | ICD-10-CM | POA: Diagnosis present

## 2023-12-20 DIAGNOSIS — N179 Acute kidney failure, unspecified: Secondary | ICD-10-CM | POA: Diagnosis present

## 2023-12-20 DIAGNOSIS — Z87891 Personal history of nicotine dependence: Secondary | ICD-10-CM

## 2023-12-20 DIAGNOSIS — I459 Conduction disorder, unspecified: Secondary | ICD-10-CM | POA: Diagnosis present

## 2023-12-20 DIAGNOSIS — J9601 Acute respiratory failure with hypoxia: Secondary | ICD-10-CM | POA: Diagnosis present

## 2023-12-20 DIAGNOSIS — E088 Diabetes mellitus due to underlying condition with unspecified complications: Secondary | ICD-10-CM

## 2023-12-20 DIAGNOSIS — E1169 Type 2 diabetes mellitus with other specified complication: Secondary | ICD-10-CM | POA: Diagnosis present

## 2023-12-20 DIAGNOSIS — Z8249 Family history of ischemic heart disease and other diseases of the circulatory system: Secondary | ICD-10-CM

## 2023-12-20 DIAGNOSIS — J4489 Other specified chronic obstructive pulmonary disease: Secondary | ICD-10-CM | POA: Diagnosis present

## 2023-12-20 DIAGNOSIS — N1832 Chronic kidney disease, stage 3b: Secondary | ICD-10-CM | POA: Diagnosis present

## 2023-12-20 DIAGNOSIS — I5033 Acute on chronic diastolic (congestive) heart failure: Secondary | ICD-10-CM | POA: Diagnosis present

## 2023-12-20 DIAGNOSIS — I712 Thoracic aortic aneurysm, without rupture, unspecified: Secondary | ICD-10-CM | POA: Diagnosis present

## 2023-12-20 DIAGNOSIS — I10 Essential (primary) hypertension: Secondary | ICD-10-CM

## 2023-12-20 DIAGNOSIS — Z6841 Body Mass Index (BMI) 40.0 and over, adult: Secondary | ICD-10-CM

## 2023-12-20 DIAGNOSIS — Z7951 Long term (current) use of inhaled steroids: Secondary | ICD-10-CM

## 2023-12-20 DIAGNOSIS — Z7901 Long term (current) use of anticoagulants: Secondary | ICD-10-CM

## 2023-12-20 DIAGNOSIS — I4819 Other persistent atrial fibrillation: Secondary | ICD-10-CM | POA: Diagnosis present

## 2023-12-20 DIAGNOSIS — E874 Mixed disorder of acid-base balance: Secondary | ICD-10-CM | POA: Diagnosis present

## 2023-12-20 DIAGNOSIS — Z9981 Dependence on supplemental oxygen: Secondary | ICD-10-CM

## 2023-12-20 DIAGNOSIS — Z881 Allergy status to other antibiotic agents status: Secondary | ICD-10-CM

## 2023-12-20 DIAGNOSIS — R0600 Dyspnea, unspecified: Secondary | ICD-10-CM

## 2023-12-20 DIAGNOSIS — Z8711 Personal history of peptic ulcer disease: Secondary | ICD-10-CM

## 2023-12-20 DIAGNOSIS — R0689 Other abnormalities of breathing: Secondary | ICD-10-CM

## 2023-12-20 DIAGNOSIS — Z9049 Acquired absence of other specified parts of digestive tract: Secondary | ICD-10-CM

## 2023-12-20 DIAGNOSIS — J9602 Acute respiratory failure with hypercapnia: Secondary | ICD-10-CM | POA: Diagnosis present

## 2023-12-20 DIAGNOSIS — E66813 Obesity, class 3: Secondary | ICD-10-CM | POA: Diagnosis present

## 2023-12-20 DIAGNOSIS — Z833 Family history of diabetes mellitus: Secondary | ICD-10-CM

## 2023-12-20 DIAGNOSIS — J449 Chronic obstructive pulmonary disease, unspecified: Secondary | ICD-10-CM | POA: Diagnosis present

## 2023-12-20 DIAGNOSIS — K761 Chronic passive congestion of liver: Secondary | ICD-10-CM | POA: Diagnosis present

## 2023-12-20 DIAGNOSIS — E875 Hyperkalemia: Secondary | ICD-10-CM | POA: Diagnosis present

## 2023-12-20 DIAGNOSIS — E782 Mixed hyperlipidemia: Secondary | ICD-10-CM | POA: Diagnosis present

## 2023-12-20 DIAGNOSIS — Z9884 Bariatric surgery status: Secondary | ICD-10-CM

## 2023-12-20 DIAGNOSIS — E785 Hyperlipidemia, unspecified: Secondary | ICD-10-CM | POA: Diagnosis not present

## 2023-12-20 DIAGNOSIS — Z794 Long term (current) use of insulin: Secondary | ICD-10-CM | POA: Diagnosis not present

## 2023-12-20 DIAGNOSIS — Z880 Allergy status to penicillin: Secondary | ICD-10-CM

## 2023-12-20 DIAGNOSIS — I21A1 Myocardial infarction type 2: Secondary | ICD-10-CM | POA: Diagnosis not present

## 2023-12-20 DIAGNOSIS — Z91148 Patient's other noncompliance with medication regimen for other reason: Secondary | ICD-10-CM

## 2023-12-20 DIAGNOSIS — E872 Acidosis, unspecified: Secondary | ICD-10-CM | POA: Insufficient documentation

## 2023-12-20 DIAGNOSIS — K219 Gastro-esophageal reflux disease without esophagitis: Secondary | ICD-10-CM | POA: Diagnosis present

## 2023-12-20 DIAGNOSIS — Z8261 Family history of arthritis: Secondary | ICD-10-CM

## 2023-12-20 DIAGNOSIS — N184 Chronic kidney disease, stage 4 (severe): Secondary | ICD-10-CM

## 2023-12-20 DIAGNOSIS — L89116 Pressure-induced deep tissue damage of right upper back: Secondary | ICD-10-CM | POA: Diagnosis present

## 2023-12-20 HISTORY — DX: Chronic diastolic (congestive) heart failure: I50.32

## 2023-12-20 HISTORY — DX: Thrombocytopenia, unspecified: D69.6

## 2023-12-20 HISTORY — DX: Acidosis, unspecified: E87.20

## 2023-12-20 HISTORY — DX: Chronic kidney disease, stage 3b: N18.32

## 2023-12-20 HISTORY — DX: Other specified abnormal findings of blood chemistry: R79.89

## 2023-12-20 HISTORY — DX: Chronic kidney disease, stage 4 (severe): N18.4

## 2023-12-20 HISTORY — DX: Acute respiratory failure with hypoxia: J96.01

## 2023-12-20 HISTORY — DX: Acute on chronic diastolic (congestive) heart failure: I50.33

## 2023-12-20 HISTORY — DX: Chronic obstructive pulmonary disease, unspecified: J44.9

## 2023-12-20 LAB — I-STAT VENOUS BLOOD GAS, ED
Acid-Base Excess: 3 mmol/L — ABNORMAL HIGH (ref 0.0–2.0)
Bicarbonate: 30.3 mmol/L — ABNORMAL HIGH (ref 20.0–28.0)
Calcium, Ion: 0.95 mmol/L — ABNORMAL LOW (ref 1.15–1.40)
HCT: 50 % — ABNORMAL HIGH (ref 36.0–46.0)
Hemoglobin: 17 g/dL — ABNORMAL HIGH (ref 12.0–15.0)
O2 Saturation: 98 %
Potassium: 5.3 mmol/L — ABNORMAL HIGH (ref 3.5–5.1)
Sodium: 137 mmol/L (ref 135–145)
TCO2: 32 mmol/L (ref 22–32)
pCO2, Ven: 56.8 mmHg (ref 44–60)
pH, Ven: 7.335 (ref 7.25–7.43)
pO2, Ven: 118 mmHg — ABNORMAL HIGH (ref 32–45)

## 2023-12-20 LAB — I-STAT CHEM 8, ED
BUN: 45 mg/dL — ABNORMAL HIGH (ref 8–23)
Calcium, Ion: 0.96 mmol/L — ABNORMAL LOW (ref 1.15–1.40)
Chloride: 100 mmol/L (ref 98–111)
Creatinine, Ser: 2.9 mg/dL — ABNORMAL HIGH (ref 0.44–1.00)
Glucose, Bld: 74 mg/dL (ref 70–99)
HCT: 51 % — ABNORMAL HIGH (ref 36.0–46.0)
Hemoglobin: 17.3 g/dL — ABNORMAL HIGH (ref 12.0–15.0)
Potassium: 5.3 mmol/L — ABNORMAL HIGH (ref 3.5–5.1)
Sodium: 137 mmol/L (ref 135–145)
TCO2: 30 mmol/L (ref 22–32)

## 2023-12-20 LAB — CBC WITH DIFFERENTIAL/PLATELET
Abs Immature Granulocytes: 0.13 10*3/uL — ABNORMAL HIGH (ref 0.00–0.07)
Basophils Absolute: 0 10*3/uL (ref 0.0–0.1)
Basophils Relative: 0 %
Eosinophils Absolute: 0 10*3/uL (ref 0.0–0.5)
Eosinophils Relative: 0 %
HCT: 50.6 % — ABNORMAL HIGH (ref 36.0–46.0)
Hemoglobin: 14.1 g/dL (ref 12.0–15.0)
Immature Granulocytes: 1 %
Lymphocytes Relative: 18 %
Lymphs Abs: 1.6 10*3/uL (ref 0.7–4.0)
MCH: 26.6 pg (ref 26.0–34.0)
MCHC: 27.9 g/dL — ABNORMAL LOW (ref 30.0–36.0)
MCV: 95.3 fL (ref 80.0–100.0)
Monocytes Absolute: 0.9 10*3/uL (ref 0.1–1.0)
Monocytes Relative: 9 %
Neutro Abs: 6.5 10*3/uL (ref 1.7–7.7)
Neutrophils Relative %: 72 %
Platelets: 113 10*3/uL — ABNORMAL LOW (ref 150–400)
RBC: 5.31 MIL/uL — ABNORMAL HIGH (ref 3.87–5.11)
RDW: 20.6 % — ABNORMAL HIGH (ref 11.5–15.5)
WBC: 9.2 10*3/uL (ref 4.0–10.5)
nRBC: 0.8 % — ABNORMAL HIGH (ref 0.0–0.2)

## 2023-12-20 LAB — COMPREHENSIVE METABOLIC PANEL WITH GFR
ALT: 844 U/L — ABNORMAL HIGH (ref 0–44)
AST: 1744 U/L — ABNORMAL HIGH (ref 15–41)
Albumin: 2.6 g/dL — ABNORMAL LOW (ref 3.5–5.0)
Alkaline Phosphatase: 106 U/L (ref 38–126)
Anion gap: 15 (ref 5–15)
BUN: 34 mg/dL — ABNORMAL HIGH (ref 8–23)
CO2: 27 mmol/L (ref 22–32)
Calcium: 8.3 mg/dL — ABNORMAL LOW (ref 8.9–10.3)
Chloride: 99 mmol/L (ref 98–111)
Creatinine, Ser: 3.01 mg/dL — ABNORMAL HIGH (ref 0.44–1.00)
GFR, Estimated: 15 mL/min — ABNORMAL LOW (ref >60)
Glucose, Bld: 78 mg/dL (ref 70–99)
Potassium: 5.4 mmol/L — ABNORMAL HIGH (ref 3.5–5.1)
Sodium: 141 mmol/L (ref 135–145)
Total Bilirubin: 2.3 mg/dL — ABNORMAL HIGH (ref 0.0–1.2)
Total Protein: 5.2 g/dL — ABNORMAL LOW (ref 6.5–8.1)

## 2023-12-20 LAB — BRAIN NATRIURETIC PEPTIDE: B Natriuretic Peptide: 1019 pg/mL — ABNORMAL HIGH (ref 0.0–100.0)

## 2023-12-20 LAB — ETHANOL
Alcohol, Ethyl (B): <15 mg/dL (ref <15)
Alcohol, Ethyl (B): <15 mg/dL (ref <15)

## 2023-12-20 LAB — I-STAT CG4 LACTIC ACID, ED
Lactic Acid, Venous: 4.9 mmol/L (ref 0.5–1.9)
Lactic Acid, Venous: 3.6 mmol/L (ref 0.5–1.9)

## 2023-12-20 LAB — LIPASE, BLOOD: Lipase: 19 U/L (ref 11–51)

## 2023-12-20 LAB — TROPONIN I (HIGH SENSITIVITY)
Troponin I (High Sensitivity): 410 ng/L (ref <18)
Troponin I (High Sensitivity): 425 ng/L (ref <18)

## 2023-12-20 LAB — RESP PANEL BY RT-PCR (RSV, FLU A&B, COVID)  RVPGX2
Influenza A by PCR: NEGATIVE
Influenza B by PCR: NEGATIVE
Resp Syncytial Virus by PCR: NEGATIVE
SARS Coronavirus 2 by RT PCR: NEGATIVE

## 2023-12-20 LAB — PROCALCITONIN: Procalcitonin: 0.39 ng/mL

## 2023-12-20 LAB — ACETAMINOPHEN LEVEL: Acetaminophen (Tylenol), Serum: <10 ug/mL — ABNORMAL LOW (ref 10–30)

## 2023-12-20 MED ORDER — IPRATROPIUM-ALBUTEROL 0.5-2.5 (3) MG/3ML IN SOLN
3.0000 mL | Freq: Four times a day (QID) | RESPIRATORY_TRACT | Status: DC | PRN
  Administered 2023-12-21: 3 mL via RESPIRATORY_TRACT
  Filled 2023-12-20: qty 3

## 2023-12-20 MED ORDER — FUROSEMIDE 10 MG/ML IJ SOLN
40.0000 mg | Freq: Two times a day (BID) | INTRAMUSCULAR | Status: DC
Start: 2023-12-21 — End: 2023-12-21
  Administered 2023-12-21: 40 mg via INTRAVENOUS
  Filled 2023-12-20: qty 4

## 2023-12-20 MED ORDER — ACETAMINOPHEN 650 MG RE SUPP: 650.0000 mg | Freq: Four times a day (QID) | RECTAL | Status: DC | PRN

## 2023-12-20 MED ORDER — OXYCODONE HCL 5 MG PO TABS
2.5000 mg | ORAL_TABLET | ORAL | Status: DC | PRN
  Administered 2023-12-22 – 2023-12-24 (×4): 5 mg via ORAL
  Filled 2023-12-20 (×4): qty 1

## 2023-12-20 MED ORDER — TRIMETHOBENZAMIDE HCL 100 MG/ML IM SOLN: 200.0000 mg | Freq: Four times a day (QID) | INTRAMUSCULAR | Status: DC | PRN

## 2023-12-20 MED ORDER — HEPARIN BOLUS VIA INFUSION
4000.0000 [IU] | Freq: Once | INTRAVENOUS | Status: AC
  Administered 2023-12-20: 4000 [IU] via INTRAVENOUS
  Filled 2023-12-20: qty 4000

## 2023-12-20 MED ORDER — FUROSEMIDE 10 MG/ML IJ SOLN
40.0000 mg | Freq: Once | INTRAMUSCULAR | Status: AC
  Administered 2023-12-20: 40 mg via INTRAVENOUS
  Filled 2023-12-20: qty 4

## 2023-12-20 MED ORDER — INSULIN ASPART 100 UNIT/ML IJ SOLN
0.0000 [IU] | INTRAMUSCULAR | Status: DC
  Administered 2023-12-21 (×3): 1 [IU] via SUBCUTANEOUS
  Administered 2023-12-21: 2 [IU] via SUBCUTANEOUS
  Administered 2023-12-22 – 2023-12-23 (×4): 1 [IU] via SUBCUTANEOUS
  Administered 2023-12-23: 3 [IU] via SUBCUTANEOUS
  Administered 2023-12-24: 1 [IU] via SUBCUTANEOUS
  Administered 2023-12-24: 3 [IU] via SUBCUTANEOUS
  Administered 2023-12-24 (×2): 1 [IU] via SUBCUTANEOUS

## 2023-12-20 MED ORDER — ACETAMINOPHEN 325 MG PO TABS
650.0000 mg | ORAL_TABLET | Freq: Four times a day (QID) | ORAL | Status: DC | PRN
  Administered 2023-12-26: 650 mg via ORAL
  Filled 2023-12-20: qty 2

## 2023-12-20 MED ORDER — SODIUM CHLORIDE 0.9% FLUSH
3.0000 mL | Freq: Two times a day (BID) | INTRAVENOUS | Status: DC
  Administered 2023-12-20 – 2023-12-29 (×18): 3 mL via INTRAVENOUS

## 2023-12-20 MED ORDER — HEPARIN (PORCINE) 25000 UT/250ML-% IV SOLN
1500.0000 [IU]/h | INTRAVENOUS | Status: DC
  Administered 2023-12-20: 1150 [IU]/h via INTRAVENOUS
  Administered 2023-12-21: 1500 [IU]/h via INTRAVENOUS
  Filled 2023-12-20 (×2): qty 250

## 2023-12-20 MED ORDER — SODIUM CHLORIDE 0.9 % IV SOLN
2.0000 g | Freq: Once | INTRAVENOUS | Status: AC
  Administered 2023-12-20: 2 g via INTRAVENOUS
  Filled 2023-12-20: qty 10

## 2023-12-20 MED ORDER — VANCOMYCIN HCL IN DEXTROSE 1-5 GM/200ML-% IV SOLN
1000.0000 mg | Freq: Once | INTRAVENOUS | Status: AC
  Administered 2023-12-20: 1000 mg via INTRAVENOUS
  Filled 2023-12-20: qty 200

## 2023-12-20 NOTE — Progress Notes (Signed)
 ON-CALL CARDIOLOGY 12/20/23  Patient's name: Holly Spence.   MRN: [id number].    DOB: 1946-03-16 Primary care provider: [facility] Primary cardiologist: Lind Repine, MD  Interaction regarding this patient's care today: ED physician reaching out for consultation regarding the patient's presentation. Patient presents with worsening shortness of breath over the last 3 weeks No chest pain  EKG illustrates underlying rhythm to be atrial fibrillation with paced rhythm.  Initial blood work notes transaminitis, acute kidney injury, uptrending troponins  Rectal temperature is 98%  Systolic blood pressures are ranging between 115-142 mmHg  Known history of COPD on nasal cannula oxygen  Impression: Shortness of breath-multifactorial Elevated troponins Transaminitis Acute kidney injury Hyperkalemia  Recommendations: Cardiology consulted for heart failure exacerbation.  No active chest pain and EKG does not show ST segment elevation.  Trend troponins.  ED physician started IV heparin  drip  Transaminitis-multifactorial AST ALT 2-1-check UDS and alcohol levels.  Blood pressures are stable.  Low probability for cardiogenic shock but cannot entirely rule out given the elevated lactic and transaminitis.  Hyperkalemia, currently being treated by ED  Currently being admitted to medicine. Full consult to follow  No charge  Olinda Bertrand, DO, [facility]  A Division of [facility] [address], [place], [place] [address]  [place], [place] [address] 12/20/2023 8:14 PM

## 2023-12-20 NOTE — ED Triage Notes (Signed)
 Pt BIB EMS from home. Pt c/o SOB for 3 weeks, worsening last night. Hx COPD. Edema to right arm/hand and LLE. EMS admin duoneb, 125mg  solu-medrol, 2g mag. Pt was initially at 70% on room air, placed on non rebreather and came up to 87%.

## 2023-12-20 NOTE — ED Provider Notes (Signed)
 [place] EMERGENCY DEPARTMENT AT [facility] Provider Note   CSN: [id number] Arrival date & time: 12/20/23  1759     Patient presents with: No chief complaint on file.   Holly Spence is a 78 y.o. female.   78 year old female who presents with shortness of breath times several weeks.  Does have a history of COPD and she is not on home oxygen.  Denies any fever or chills.  No vomiting or diarrhea.  No chest pain.  She has noted increasing dyspnea on exertion.  Called EMS and was found to be hypoxic.  Was given Solu-Medrol 125, albuterol  DuoNeb, 2 g magnesium and transported here       Prior to Admission medications   Medication Sig Start Date End Date Taking? Authorizing Provider  albuterol  (VENTOLIN  HFA) 108 (90 Base) MCG/ACT inhaler Inhale 2 puffs into the lungs every 6 (six) hours as needed for wheezing or shortness of breath.  04/02/20   [provider]  amiodarone  (PACERONE ) 200 MG tablet Take 1 tablet (200 mg total) by mouth daily. 03/26/23   Nathanel Bal, PA-C  apixaban  (ELIQUIS ) 5 MG TABS tablet Take 1 tablet (5 mg total) by mouth 2 (two) times daily. 05/16/23   Fenton, Clint R, PA  Budeson-Glycopyrrol-Formoterol  (BREZTRI AEROSPHERE) 160-9-4.8 MCG/ACT AERO Inhale 2 puffs into the lungs 2 (two) times daily.    [provider]  cyanocobalamin 1000 MCG tablet Take 1,000 mcg by mouth daily.    [provider]  diclofenac sodium (VOLTAREN) 1 % GEL Apply 2-4 g topically every 6 (six) hours as needed (pain). 10/23/18   [provider]  ferrous sulfate 325 (65 FE) MG EC tablet Take 325 mg by mouth daily with breakfast.    [provider]  gabapentin  (NEURONTIN ) 300 MG capsule Take 300 mg by mouth 3 (three) times daily. 07/19/22   [provider]  insulin  NPH-regular Human (70-30) 100 UNIT/ML injection Inject 55-90 Units into the skin 2 (two) times daily with a meal. Per sliding scale    [provider]  liver  oil-zinc  oxide (DESITIN) 40 % ointment Apply topically 2 (two) times daily. Patient taking differently: Apply 1 Application topically daily as needed for irritation. 12/30/22   Ezenduka, Nkeiruka J, MD  metoprolol  succinate (TOPROL  XL) 25 MG 24 hr tablet Take 1 tablet (25 mg total) by mouth daily. 11/08/21   Revankar, Micael Adas, MD  pantoprazole  (PROTONIX ) 40 MG tablet Take 40 mg by mouth daily before breakfast.    [provider]  rOPINIRole  (REQUIP ) 4 MG tablet Take 4 mg by mouth at bedtime.  10/22/18   [provider]  rosuvastatin  (CRESTOR ) 10 MG tablet Take 10 mg by mouth at bedtime. 11/07/21   [provider]  torsemide  (DEMADEX ) 20 MG tablet Take 3 tablets (60 mg total) by mouth daily. 03/05/23   Nathanel Bal, PA-C  traMADol  (ULTRAM ) 50 MG tablet Take 100 mg by mouth in the morning and at bedtime. 12/14/14   [provider]    Allergies: Penicillins, Sulfa antibiotics, Erythromycin, Azithromycin, and Ciprofloxacin hcl    Review of Systems  All other systems reviewed and are negative.   Updated Vital Signs There were no vitals taken for this visit.  Physical Exam Vitals and nursing note reviewed.  Constitutional:      General: She is not in acute distress.    Appearance: Normal appearance. She is well-developed. She is not toxic-appearing.  HENT:  Head: Normocephalic and atraumatic.   Eyes:     General: Lids are normal.     Conjunctiva/sclera: Conjunctivae normal.     Pupils: Pupils are equal, round, and reactive to light.   Neck:     Thyroid : No thyroid  mass.     Trachea: No tracheal deviation.   Cardiovascular:     Rate and Rhythm: Normal rate and regular rhythm.     Heart sounds: Normal heart sounds. No murmur heard.    No gallop.  Pulmonary:     Effort: Tachypnea and respiratory distress present.     Breath sounds: No stridor. Examination of the right-upper field reveals rhonchi. Examination of the left-upper field reveals rhonchi.  Rhonchi present. No decreased breath sounds, wheezing or rales.  Abdominal:     General: There is no distension.     Palpations: Abdomen is soft.     Tenderness: There is no abdominal tenderness. There is no rebound.   Musculoskeletal:        General: No tenderness. Normal range of motion.     Cervical back: Normal range of motion and neck supple.  Lymphadenopathy:     Comments: 3+ bilateral lower extremity pitting edema   Skin:    General: Skin is warm and dry.     Findings: No abrasion or rash.   Neurological:     Mental Status: She is alert and oriented to person, place, and time. Mental status is at baseline.     GCS: GCS eye subscore is 4. GCS verbal subscore is 5. GCS motor subscore is 6.     Cranial Nerves: No cranial nerve deficit.     Sensory: No sensory deficit.     Motor: Motor function is intact.   Psychiatric:        Attention and Perception: Attention normal.        Speech: Speech normal.        Behavior: Behavior normal.     (all labs ordered are listed, but only abnormal results are displayed) Labs Reviewed  RESP PANEL BY RT-PCR (RSV, FLU A&B, COVID)  [id number]  CBC WITH DIFFERENTIAL/PLATELET  COMPREHENSIVE METABOLIC PANEL WITH GFR  BRAIN NATRIURETIC PEPTIDE  I-STAT VENOUS BLOOD GAS, ED  I-STAT CHEM 8, ED  I-STAT CG4 LACTIC ACID, ED  TROPONIN I (HIGH SENSITIVITY)    EKG: EKG Interpretation Date/Time:  Thursday December 20 2023 18:29:28 EDT Ventricular Rate:  64 PR Interval:    QRS Duration:  176 QT Interval:  506 QTC Calculation: 523 R Axis:   268  Text Interpretation: Atrial fibrillation Nonspecific IVCD with LAD Anterolateral infarct, age indeterminate No significant change since last tracing Confirmed by Lind Repine ([id number]) on 12/20/2023 6:31:39 PM  Radiology: No results found.   Procedures   Medications Ordered in the ED - No data to display                                  Medical Decision Making Amount and/or Complexity of Data  Reviewed Labs: ordered. Radiology: ordered. ECG/medicine tests: ordered.  Risk Prescription drug management.   Patient is EKG shows A-fib.  Chest x-ray per my dictation shows CHF with possible pneumonia.  Does have elevated lactate of 4.9.  Her rectal temp did not show any evidence of fever.  Did have a venous gas performed which showed no evidence of CO2 retention.  pO2 was 118.  Does have evidence of acute kidney  injury.  Patient's BNP is elevated consistent with CHF.  She also has also has evidence of possible NSTEMI with troponin of 410.  Labs are significant also for transaminitis with elevated AST and ALT.  Bili slightly elevated 2.3.  She denies any abdominal pain at this time.  Presently she has normal white count.  Her hemoglobin is stable at 14.  Respiratory panel including COVID and flu negative.  Patient started on IV antibiotics for possible pneumonia.  She has multiple allergies.  Due to patient's signs of CHF, will not give patient fluid bolus at this time.  Will consult cardiology for her NSTEMI as well as CHF.  Will consult hospitalist team for admission  CRITICAL CARE Performed by: Eden Goodpasture Total critical care time: 65 minutes Critical care time was exclusive of separately billable procedures and treating other patients. Critical care was necessary to treat or prevent imminent or life-threatening deterioration. Critical care was time spent personally by me on the following activities: development of treatment plan with patient and/or surrogate as well as nursing, discussions with consultants, evaluation of patient's response to treatment, examination of patient, obtaining history from patient or surrogate, ordering and performing treatments and interventions, ordering and review of laboratory studies, ordering and review of radiographic studies, pulse oximetry and re-evaluation of patient's condition.      Final diagnoses:  None    ED Discharge Orders     None           Lind Repine, MD 12/20/23 1942

## 2023-12-20 NOTE — Progress Notes (Addendum)
 PHARMACY - ANTICOAGULATION CONSULT NOTE  Pharmacy Consult for heparin  dosing Indication: chest pain/ACS  Allergies  Allergen Reactions   Penicillins Hives and Other (See Comments)    At injection site only- hives   Sulfa Antibiotics Hives   Erythromycin Other (See Comments)    Unsure    Azithromycin Rash and Other (See Comments)    Broke out the legs   Ciprofloxacin Hcl Other (See Comments)    Abdominal Pain    Patient Measurements: Height: 5' 3 (160 cm) IBW/kg (Calculated) : 52.4 Heparin  dosing wt: 86.7kg  Vital Signs: Temp: 98.4 F (36.9 C) (06/12 1855) Temp Source: Rectal (06/12 1855) BP: 115/33 (06/12 1915) Pulse Rate: 63 (06/12 1915)  Labs: Recent Labs    12/20/23 1815 12/20/23 1849  HGB 14.1 17.3*  17.0*  HCT 50.6* 51.0*  50.0*  PLT 113*  --   CREATININE 3.01* 2.90*  TROPONINIHS 410*  --     CrCl cannot be calculated (Unknown ideal weight.).   Medical History: Past Medical History:  Diagnosis Date   Allergic rhinitis 09/20/2015   Arthritis    Ascending aorta dilation (HCC) 12/09/2018   Back pain    Benign essential hypertension 09/20/2015   Bruises easily    Complete heart block (HCC) 06/11/2020   Contact lens/glasses fitting    Cough    Depression 02/08/2019   Diabetes mellitus due to underlying condition with unspecified complications (HCC) 11/10/2015   Duodenal ulcer 02/08/2019   Fatigue    Gastroesophageal reflux disease 09/20/2015   GI bleeding 06/27/2019   Hx of laparoscopic gastric banding 01/31/2011   Surgery: 03/22/10    Hyperlipidemia    Insulin  long-term use (HCC) 09/20/2015   Melena 02/08/2019   Moderate asthma 02/08/2019   Palpitations 11/10/2015   Poor circulation    Right renal mass 02/08/2019   Sinus problem    Sleep apnea    SOB (shortness of breath)    Symptomatic bradycardia 06/11/2020   Uncontrolled type 2 diabetes mellitus without complication, with long-term current use of insulin  09/20/2015     Assessment: 78 YOF presenting with  progressive SOB for several weeks and CP. Pertinent PMH of atrial fibrillation, HTN, COPD, morbid obesity. Per patient, has been discontinued from Apixaban  since 06/2023. Pharmacy has been consulted for heparin  dosing.  Goal of Therapy:  Heparin  level 0.3-0.7 units/ml Monitor platelets by anticoagulation protocol: Yes   Plan:  Give 4000 units bolus x 1 Start heparin  infusion at 1150 units/hr Check anti-Xa level in 8 hours and daily while on heparin  Continue to monitor H&H and platelets  Holly Spence 12/20/2023,7:41 PM

## 2023-12-20 NOTE — Consult Note (Signed)
 Cardiology Consultation   Patient ID: EVAMAE ROWEN MRN: [id number]; DOB: Jan 05, 1946  Admit date: 12/20/2023 Date of Consult: 12/21/2023  PCP:  [facility]   [facility] Providers Cardiologist:  Nelia Balzarine, MD  Electrophysiologist:  Boyce Byes, MD       Patient Profile: Holly Spence is a 78 y.o. female with a history of paroxysmal atrial fibrillation, paroxysmal atria flutter, atherosclerotic cardiovascular disease, reported heart failure with preserved ejection fraction, mixed hyperlipidemia, stage IIIb chronic kidney disease, COPD, thoracic aorta aneurysm, hypertension, status post permanent pacemaker for complete heart block,  morbid obesity status post gastric sleeve, and prior gastro-intestinal bleeding who is being seen 12/21/2023 for the evaluation of shortness of breath at the request of Holly Amber, PA-C.  History of Present Illness: [name] has insight but unable to provide a comprehensive history.  Patient admits to having shortness of breath for probably three weeks now.  Unable to if symptoms were primarily at rest or only with exertion.  During this time she did notice a cough with phlegm but denies feeling like she had a fever or fatigue.  Other complaints include swelling in both legs, shortness of breath with lying flat, and reports of waking up due to shortness of breath. She states that she finally decided to come for evaluation due to her husband being concerned. She denies chest pain, palpitations, dizziness, or lightheadedness.    Patient was reportedly hypoxic at home when emergency medical services arrived.  When patient arrived, ECG revealed atrial fibrillation with slow ventricular rate with intraventricular conduction delay unchanged from prior ECG.  Her lactic acid was 4.9 mmol/L eventually trending to 3.6 mmol/L in the setting of significantly elevated transaminases.  Initial HS-troponin was 410 ng/L and went to 425  ng/L.  Noted acute on chronic kidney injury with creatinine of 3.01 mg/dL, up from baseline.  Chest x-ray revealed leg lung pleural effusion.  She was placed on IV antibiotics, and heparin  drip.  She was given Duoneb and IV furosemide  once.    Past Medical History:  Diagnosis Date   Allergic rhinitis 09/20/2015   Arthritis    Ascending aorta dilation (HCC) 12/09/2018   Back pain    Benign essential hypertension 09/20/2015   Bruises easily    Complete heart block (HCC) 06/11/2020   Contact lens/glasses fitting    Cough    Depression 02/08/2019   Diabetes mellitus due to underlying condition with unspecified complications (HCC) 11/10/2015   Duodenal ulcer 02/08/2019   Fatigue    Gastroesophageal reflux disease 09/20/2015   GI bleeding 06/27/2019   Hx of laparoscopic gastric banding 01/31/2011   Surgery: 03/22/10    Hyperlipidemia    Insulin  long-term use (HCC) 09/20/2015   Melena 02/08/2019   Moderate asthma 02/08/2019   Palpitations 11/10/2015   Poor circulation    Right renal mass 02/08/2019   Sinus problem    Sleep apnea    SOB (shortness of breath)    Symptomatic bradycardia 06/11/2020   Uncontrolled type 2 diabetes mellitus without complication, with long-term current use of insulin  09/20/2015    Past Surgical History:  Procedure Laterality Date   CATARACT EXTRACTION     confirm date with patient   CHOLECYSTECTOMY  1987   EYE SURGERY  2003   retina   HERNIA REPAIR     LAPAROSCOPIC GASTRIC BANDING  03/22/10   PACEMAKER IMPLANT N/A 06/11/2020   Procedure: PACEMAKER IMPLANT;  Surgeon: Boyce Byes, MD;  Location:  [facility];  Service: Cardiovascular;  Laterality: N/A;     Home Medications:  Prior to Admission medications   Medication Sig Start Date End Date Taking? Authorizing Provider  albuterol  (VENTOLIN  HFA) 108 (90 Base) MCG/ACT inhaler Inhale 2 puffs into the lungs every 6 (six) hours as needed for wheezing or shortness of breath.  04/02/20  Yes [provider]   amiodarone  (PACERONE ) 200 MG tablet Take 1 tablet (200 mg total) by mouth daily. Patient taking differently: Take 200 mg by mouth in the morning. 03/26/23  Yes Nathanel Bal, PA-C  cyanocobalamin 1000 MCG tablet Take 1,000 mcg by mouth in the morning.   Yes [provider]  diclofenac sodium (VOLTAREN) 1 % GEL Apply 2-4 g topically every 6 (six) hours as needed (pain). 10/23/18  Yes [provider]  ferrous sulfate 325 (65 FE) MG EC tablet Take 325 mg by mouth at bedtime.   Yes [provider]  gabapentin  (NEURONTIN ) 300 MG capsule Take 600 mg by mouth at bedtime. 07/19/22  Yes [provider]  insulin  NPH-regular Human (70-30) 100 UNIT/ML injection Inject 55-90 Units into the skin 2 (two) times daily with a meal. Per sliding scale   Yes [provider]  liver oil-zinc  oxide (DESITIN) 40 % ointment Apply topically 2 (two) times daily. Patient taking differently: Apply 1 Application topically 3 (three) times daily as needed for irritation. 12/30/22  Yes Ezenduka, Nkeiruka J, MD  metoprolol  succinate (TOPROL  XL) 25 MG 24 hr tablet Take 1 tablet (25 mg total) by mouth daily. Patient taking differently: Take 25 mg by mouth in the morning. 11/08/21  Yes Revankar, Micael Adas, MD  pantoprazole  (PROTONIX ) 40 MG tablet Take 40 mg by mouth daily before breakfast.   Yes [provider]  rOPINIRole  (REQUIP ) 4 MG tablet Take 4 mg by mouth in the morning. 10/22/18  Yes [provider]  torsemide  (DEMADEX ) 20 MG tablet Take 3 tablets (60 mg total) by mouth daily. Patient taking differently: Take 60 mg by mouth in the morning. 03/05/23  Yes Nathanel Bal, PA-C  traMADol  (ULTRAM ) 50 MG tablet Take 100 mg by mouth in the morning and at bedtime. 12/14/14  Yes [provider]    Scheduled Meds:  furosemide   40 mg Intravenous Q12H   insulin  aspart  0-6 Units Subcutaneous Q4H   sodium chloride  flush  3 mL Intravenous Q12H   Continuous Infusions:   heparin  1,150 Units/hr (12/20/23 2246)   PRN Meds: acetaminophen  **OR** acetaminophen , ipratropium-albuterol , oxyCODONE , trimethobenzamide   Allergies:    Allergies  Allergen Reactions   Penicillins Hives and Other (See Comments)    At injection site only- hives   Sulfa Antibiotics Hives   Erythromycin Other (See Comments)    Unsure    Azithromycin Rash and Other (See Comments)    Broke out the legs   Ciprofloxacin Hcl Other (See Comments)    Abdominal Pain    Social History:   Social History   Socioeconomic History   Marital status: Married    Spouse name: Not on file   Number of children: Not on file   Years of education: Not on file   Highest education level: Not on file  Occupational History   Not on file  Tobacco Use   Smoking status: Former    Current packs/day: 0.00    Types: Cigarettes    Quit date: 2001    Years since quitting: 24.4   Smokeless tobacco: Never  Vaping  Use   Vaping status: Never Used  Substance and Sexual Activity   Alcohol use: No   Drug use: No   Sexual activity: Not on file  Other Topics Concern   Not on file  Social History Narrative   Not on file   Social Drivers of Health   Financial Resource Strain: Not on file  Food Insecurity: No Food Insecurity (12/20/2023)   Hunger Vital Sign    Worried About Running Out of Food in the Last Year: Never true    Ran Out of Food in the Last Year: Never true  Transportation Needs: No Transportation Needs (12/20/2023)   PRAPARE - Administrator, Civil Service (Medical): No    Lack of Transportation (Non-Medical): No  Physical Activity: Not on file  Stress: Not on file  Social Connections: Unknown (12/20/2023)   Social Connection and Isolation Panel    Frequency of Communication with Friends and Family: Twice a week    Frequency of Social Gatherings with Friends and Family: Twice a week    Attends Religious Services: Not on Marketing executive or Organizations: Not on  file    Attends Banker Meetings: Not on file    Marital Status: Not on file  Intimate Partner Violence: Not At Risk (12/20/2023)   Humiliation, Afraid, Rape, and Kick questionnaire    Fear of Current or Ex-Partner: No    Emotionally Abused: No    Physically Abused: No    Sexually Abused: No    Family History:   Family History  Problem Relation Age of Onset   Arthritis Mother    Heart disease Mother    Hypertension Mother    Heart disease Father    Diabetes Father    Diabetes Sister      ROS:  Please see the history of present illness.   All other ROS reviewed and negative.     Physical Exam/Data: Vitals:   12/20/23 1915 12/20/23 1925 12/20/23 2242 12/21/23 0003  BP: (!) 115/33 113/84 95/76 (!) 110/47  Pulse: 63 63 67 64  Resp: 17 13 15 17   Temp:   97.9 F (36.6 C) 97.9 F (36.6 C)  TempSrc:   Axillary Axillary  SpO2: 98% 94% 93% 90%  Weight:   (!) 143.7 kg   Height:   5' 3 (1.6 m)     Intake/Output Summary (Last 24 hours) at 12/21/2023 0045 Last data filed at 12/20/2023 2201 Gross per 24 hour  Intake 217.29 ml  Output --  Net 217.29 ml      12/20/2023   10:42 PM 03/05/2023    2:55 PM 02/19/2023    4:13 PM  Last 3 Weights  Weight (lbs) 316 lb 12.8 oz 295 lb 6.4 oz 320 lb  Weight (kg) 143.7 kg 133.993 kg 145.151 kg     Body mass index is 56.12 kg/m.  General:  Well nourished, well developed, in no acute distress HEENT: normal Neck: JVD unable to assess due to body habitus. Vascular: No carotid bruits; Distal pulses 2+ bilaterally Cardiac:  Irregular-irregular, normal S1, S2; RRR; no murmur  Lungs:  Distant lung sounds with intermittent gargling sound, faint crackles, no wheezing, rhonchi or rales  Abd: soft, nontender, no hepatomegaly  Ext: trace bilateral edema Musculoskeletal:  No deformities, BUE and BLE strength normal and equal Skin: warm and dry  Neuro:  CNs 2-12 intact, no focal abnormalities noted Psych:  Normal affect but delayed  responses to  questions  EKG:  The EKG was personally reviewed and demonstrates:  12/20/23 (18:29 hrs): atrial fibrillation with slow ventricular rate; IVCD; cannot rule out anterolateral infarct Telemetry:  Telemetry was personally reviewed and demonstrates:  atrial fibrillation with slow ventricular rate and intermittent ventricular pacing.    Relevant CV Studies: # Echocardiogram 12/29/22: IMPRESSIONS   1. Left ventricular ejection fraction, by estimation, is 55 to 60%. The  left ventricle has normal function. The left ventricle has no regional  wall motion abnormalities. There is mild left ventricular hypertrophy.  Left ventricular diastolic parameters  are indeterminate.   2. Right ventricular systolic function is normal. The right ventricular  size is mildly enlarged. There is moderately elevated pulmonary artery  systolic pressure. The estimated right ventricular systolic pressure is  47.7 mmHg.   3. The mitral valve is degenerative. Trivial mitral valve regurgitation.  No evidence of mitral stenosis. Moderate mitral annular calcification.   4. The aortic valve was not well visualized. Aortic valve regurgitation  is not visualized. Aortic valve sclerosis is present, with no evidence of  aortic valve stenosis.   5. Aortic dilatation noted. There is mild dilatation of the ascending  aorta, measuring 41 mm.   6. The inferior vena cava is dilated in size with <50% respiratory  variability, suggesting right atrial pressure of 15 mmHg.   Laboratory Data: High Sensitivity Troponin:   Recent Labs  Lab 12/20/23 1815 12/20/23 2016  TROPONINIHS 410* 425*     Chemistry Recent Labs  Lab 12/20/23 1815 12/20/23 1849  NA 141 137  137  K 5.4* 5.3*  5.3*  CL 99 100  CO2 27  --   GLUCOSE 78 74  BUN 34* 45*  CREATININE 3.01* 2.90*  CALCIUM  8.3*  --   GFRNONAA 15*  --   ANIONGAP 15  --     Recent Labs  Lab 12/20/23 1815  PROT 5.2*  ALBUMIN 2.6*  AST 1,744*  ALT 844*   ALKPHOS 106  BILITOT 2.3*   Lipids No results for input(s): CHOL, TRIG, HDL, LABVLDL, LDLCALC, CHOLHDL in the last 168 hours.  Hematology Recent Labs  Lab 12/20/23 1815 12/20/23 1849  WBC 9.2  --   RBC 5.31*  --   HGB 14.1 17.3*  17.0*  HCT 50.6* 51.0*  50.0*  MCV 95.3  --   MCH 26.6  --   MCHC 27.9*  --   RDW 20.6*  --   PLT 113*  --    Thyroid  No results for input(s): TSH, FREET4 in the last 168 hours.  BNP Recent Labs  Lab 12/20/23 1821  BNP 1,019.0*    DDimer No results for input(s): DDIMER in the last 168 hours.  Radiology/Studies:  DG Chest Port 1 View Result Date: 12/20/2023 CLINICAL DATA:  Shortness of breath. EXAM: PORTABLE CHEST 1 VIEW COMPARISON:  02/21/2023. FINDINGS: Cardiomegaly. Stable left chest wall pacemaker. Moderate-sized left pleural effusion with associated left basilar consolidation/atelectasis. Bilateral interstitial prominence is favored to reflect interstitial pulmonary edema. No pneumothorax. No acute osseous abnormality. IMPRESSION: 1. Moderate-sized left pleural effusion with associated left basilar consolidation/atelectasis. 2. Cardiomegaly with interstitial pulmonary edema. Electronically Signed   By: Mannie Seek M.D.   On: 12/20/2023 19:39     Assessment and Plan: Dyspnea/acute hypoxic respiratory failure: Patient with dyspnea and noted acute hypoxic respiratory failure.  Unclear etiology in the setting of noted heart failure with preserved ejection fraction and left-sided pulmonary edema on examination, must consider acute heart failure syndrome.  Noted  elevated pro-BNP.  However, clinical picture is confusing with other laboratory findings and examination that is concerning for a non-cardiac related etiology such as pneumonia given only left sided pulmonary edema.  Low suspicion for cardiogenic shock given examination and currently hemodynamically stable.  Echocardiogram has been ordered. --Cardiology team to follow up  echocardiogram in the morning. --Reasonable to get a chest CT with contrast to further characterize the lungs and potential pathological process. --Continue furosemide  for now and titrate up to an effect of 1-1.5L diuresis and 0.5 kg weight loss daily until further information. --Daily weights and strict I/Os. --Management for possible pneumonia per primary team.      Elevated HS-troponin: Patient with noted elevated HS-troponin without a delta and in the setting.  Denies chest pain and ECG unchanged from prior.  Likely stressed-induced elevated HS-troponin and low suspicion for acute coronary syndrome.  Initiated on a heparin  drip and given aspirin. --Reasonable to repeat another HS-troponin and if no change, can hold heparin  drip. --Echocardiogram to further assess for structural abnormalities.    Heart failure with preserved ejection fraction: Known heart failure with preserved ejection fraction with prior hospitalizations requiring IV diuresis.  Currently with dyspnea and possibly contributing to a multifactorial process for acute dyspnea with hypoxic respiratory failure.  Renal function limits many guideline directed therapies at this time.   --Acute dyspnea management as above.    Atrial fibrillation: Persistent atrial fibrillation.  Heart rate is controlled and patient is hemodynamically stable.  CHA2DS2 VASc score is 5, making patient moderate risk for atrial fibrillation related thrombo-embolism.  Now on a heparin  drip. --Continue heparin  drip. --Chronotropic mediated medications not warranted at this time given current slow ventricular rate.    Hypertension: Currently controlled. --Management per primary team.    Risk Assessment/Risk Scores:       [place]  Heart Association (NYHA) Functional Class NYHA Class III  CHA2DS2-VASc Score = 5   This indicates a 7.2% annual risk of stroke. The patient's score is based upon: CHF History: 1 HTN History: 1 Diabetes History:  0 Stroke History: 0 Vascular Disease History: 0 Age Score: 2 Gender Score: 1        For questions or updates, please contact [facility] Please consult [email or web address] for contact info under    Signed, Antonieta Battle, MD  12/21/2023 12:45 AM

## 2023-12-20 NOTE — ED Notes (Signed)
 Purewick placed on pt.

## 2023-12-20 NOTE — H&P (Signed)
 History and Physical    Holly Spence Holly Spence DOB: 1945/08/11 DOA: 12/20/2023  PCP: [facility]   Patient coming from: Home   Chief Complaint: SOB   HPI: Holly Spence is a 78 y.o. female with medical history significant for atrial fibrillation noncompliant with Eliquis , heart block with pacemaker, insulin -dependent diabetes mellitus, COPD, CKD 3B, and chronic HFpEF who presents with shortness of breath.  Patient reports 3 weeks of worsening shortness of breath that became severe last night.  She has also noted increased bilateral lower extremity swelling, as well as swelling of the left arm.  She has a nonproductive cough associated with this but denies chest pain, fevers, abdominal pain, nausea, or vomiting.  ED Course: Upon arrival to the ED, patient is found to be afebrile and saturating in the 90s on 3 L/min of supplemental oxygen with normal HR and stable BP.  Labs are most notable for potassium 5.4, creatinine 3.01, albumin 2.6, AST 1744, ALT 844, total bilirubin 2.3, platelets 113,000, lactate 4.9, troponin 410, and BNP 1019.  Chest x-ray reveals cardiomegaly with pulmonary edema and left pleural effusion.  Cardiology was consulted by the ED physician and the patient was treated with Lasix , vancomycin , and aztreonam.  She was started on IV heparin  infusion.  Review of Systems:  All other systems reviewed and apart from HPI, are negative.  Past Medical History:  Diagnosis Date   Allergic rhinitis 09/20/2015   Arthritis    Ascending aorta dilation (HCC) 12/09/2018   Back pain    Benign essential hypertension 09/20/2015   Bruises easily    Complete heart block (HCC) 06/11/2020   Contact lens/glasses fitting    Cough    Depression 02/08/2019   Diabetes mellitus due to underlying condition with unspecified complications (HCC) 11/10/2015   Duodenal ulcer 02/08/2019   Fatigue    Gastroesophageal reflux disease 09/20/2015   GI bleeding 06/27/2019   Hx of  laparoscopic gastric banding 01/31/2011   Surgery: 03/22/10    Hyperlipidemia    Insulin  long-term use (HCC) 09/20/2015   Melena 02/08/2019   Moderate asthma 02/08/2019   Palpitations 11/10/2015   Poor circulation    Right renal mass 02/08/2019   Sinus problem    Sleep apnea    SOB (shortness of breath)    Symptomatic bradycardia 06/11/2020   Uncontrolled type 2 diabetes mellitus without complication, with long-term current use of insulin  09/20/2015    Past Surgical History:  Procedure Laterality Date   CATARACT EXTRACTION     confirm date with patient   CHOLECYSTECTOMY  1987   EYE SURGERY  2003   retina   HERNIA REPAIR     LAPAROSCOPIC GASTRIC BANDING  03/22/10   PACEMAKER IMPLANT N/A 06/11/2020   Procedure: PACEMAKER IMPLANT;  Surgeon: Boyce Byes, MD;  Location: [facility];  Service: Cardiovascular;  Laterality: N/A;    Social History:   reports that she quit smoking about 24 years ago. Her smoking use included cigarettes. She has never used smokeless tobacco. She reports that she does not drink alcohol and does not use drugs.  Allergies  Allergen Reactions   Penicillins Hives and Other (See Comments)    At injection site only- hives   Sulfa Antibiotics Hives   Erythromycin Other (See Comments)    Unsure    Azithromycin Rash and Other (See Comments)    Broke out the legs   Ciprofloxacin Hcl Other (See Comments)    Abdominal Pain  Family History  Problem Relation Age of Onset   Arthritis Mother    Heart disease Mother    Hypertension Mother    Heart disease Father    Diabetes Father    Diabetes Sister      Prior to Admission medications   Medication Sig Start Date End Date Taking? Authorizing Provider  albuterol  (VENTOLIN  HFA) 108 (90 Base) MCG/ACT inhaler Inhale 2 puffs into the lungs every 6 (six) hours as needed for wheezing or shortness of breath.  04/02/20  Yes [provider]  amiodarone  (PACERONE ) 200 MG tablet Take 1 tablet (200 mg total)  by mouth daily. Patient taking differently: Take 200 mg by mouth in the morning. 03/26/23  Yes Nathanel Bal, PA-C  cyanocobalamin 1000 MCG tablet Take 1,000 mcg by mouth in the morning.   Yes [provider]  diclofenac sodium (VOLTAREN) 1 % GEL Apply 2-4 g topically every 6 (six) hours as needed (pain). 10/23/18  Yes [provider]  ferrous sulfate 325 (65 FE) MG EC tablet Take 325 mg by mouth at bedtime.   Yes [provider]  gabapentin  (NEURONTIN ) 300 MG capsule Take 600 mg by mouth at bedtime. 07/19/22  Yes [provider]  insulin  NPH-regular Human (70-30) 100 UNIT/ML injection Inject 55-90 Units into the skin 2 (two) times daily with a meal. Per sliding scale   Yes [provider]  liver oil-zinc  oxide (DESITIN) 40 % ointment Apply topically 2 (two) times daily. Patient taking differently: Apply 1 Application topically 3 (three) times daily as needed for irritation. 12/30/22  Yes Ezenduka, Nkeiruka J, MD  metoprolol  succinate (TOPROL  XL) 25 MG 24 hr tablet Take 1 tablet (25 mg total) by mouth daily. Patient taking differently: Take 25 mg by mouth in the morning. 11/08/21  Yes Revankar, Micael Adas, MD  pantoprazole  (PROTONIX ) 40 MG tablet Take 40 mg by mouth daily before breakfast.   Yes [provider]  rOPINIRole  (REQUIP ) 4 MG tablet Take 4 mg by mouth in the morning. 10/22/18  Yes [provider]  torsemide  (DEMADEX ) 20 MG tablet Take 3 tablets (60 mg total) by mouth daily. Patient taking differently: Take 60 mg by mouth in the morning. 03/05/23  Yes Nathanel Bal, PA-C  traMADol  (ULTRAM ) 50 MG tablet Take 100 mg by mouth in the morning and at bedtime. 12/14/14  Yes [provider]  Budeson-Glycopyrrol-Formoterol  (BREZTRI AEROSPHERE) 160-9-4.8 MCG/ACT AERO Inhale 2 puffs into the lungs 2 (two) times daily.    [provider]  rosuvastatin  (CRESTOR ) 10 MG tablet Take 10 mg by mouth at bedtime. 11/07/21   [provider]    Physical Exam: Vitals:   12/20/23 1850 12/20/23 1855 12/20/23 1915 12/20/23 1925  BP:   (!) 115/33 113/84  Pulse:   63 63  Resp:   17 13  Temp:  98.4 F (36.9 C)    TempSrc:  Rectal    SpO2: 94%  98% 94%  Height:        Constitutional: NAD, no diaphoresis  Eyes: PERTLA, lids and conjunctivae normal ENMT: Mucous membranes are dry. Posterior pharynx clear of any exudate or lesions.   Neck: supple, no masses  Respiratory: Dyspneic with speech. No wheezing.   Cardiovascular: S1 & S2 heard, regular rate and rhythm. Pretibial pitting edema bilaterally.  Abdomen: No tenderness, soft. Bowel sounds active.  Musculoskeletal: no clubbing / cyanosis. No joint deformity upper and lower extremities.   Skin: no significant rashes, lesions, ulcers. Warm,  dry, well-perfused. Neurologic: CN 2-12 grossly intact. Moving all extremities. Alert and oriented.  Psychiatric: Calm. Cooperative.    Labs and Imaging on Admission: I have personally reviewed following labs and imaging studies  CBC: Recent Labs  Lab 12/20/23 1815 12/20/23 1849  WBC 9.2  --   NEUTROABS 6.5  --   HGB 14.1 17.3*  17.0*  HCT 50.6* 51.0*  50.0*  MCV 95.3  --   PLT 113*  --    Basic Metabolic Panel: Recent Labs  Lab 12/20/23 1815 12/20/23 1849  NA 141 137  137  K 5.4* 5.3*  5.3*  CL 99 100  CO2 27  --   GLUCOSE 78 74  BUN 34* 45*  CREATININE 3.01* 2.90*  CALCIUM  8.3*  --    GFR: CrCl cannot be calculated (Unknown ideal weight.). Liver Function Tests: Recent Labs  Lab 12/20/23 1815  AST 1,744*  ALT 844*  ALKPHOS 106  BILITOT 2.3*  PROT 5.2*  ALBUMIN 2.6*   No results for input(s): LIPASE, AMYLASE in the last 168 hours. No results for input(s): AMMONIA in the last 168 hours. Coagulation Profile: No results for input(s): INR, PROTIME in the last 168 hours. Cardiac Enzymes: No results for input(s): CKTOTAL, CKMB, CKMBINDEX, TROPONINI in the last 168  hours. BNP (last 3 results) No results for input(s): PROBNP in the last 8760 hours. HbA1C: No results for input(s): HGBA1C in the last 72 hours. CBG: No results for input(s): GLUCAP in the last 168 hours. Lipid Profile: No results for input(s): CHOL, HDL, LDLCALC, TRIG, CHOLHDL, LDLDIRECT in the last 72 hours. Thyroid  Function Tests: No results for input(s): TSH, T4TOTAL, FREET4, T3FREE, THYROIDAB in the last 72 hours. Anemia Panel: No results for input(s): VITAMINB12, FOLATE, FERRITIN, TIBC, IRON, RETICCTPCT in the last 72 hours. Urine analysis:    Component Value Date/Time   COLORURINE YELLOW 07/15/2022 2012   APPEARANCEUR CLOUDY (A) 07/15/2022 2012   LABSPEC 1.014 07/15/2022 2012   PHURINE 5.0 07/15/2022 2012   GLUCOSEU >=500 (A) 07/15/2022 2012   HGBUR SMALL (A) 07/15/2022 2012   BILIRUBINUR NEGATIVE 07/15/2022 2012   KETONESUR NEGATIVE 07/15/2022 2012   PROTEINUR NEGATIVE 07/15/2022 2012   NITRITE NEGATIVE 07/15/2022 2012   LEUKOCYTESUR LARGE (A) 07/15/2022 2012   Sepsis Labs: @LABRCNTIP (procalcitonin:4,lacticidven:4) ) Recent Results (from the past 240 hours)  Resp panel by RT-PCR (RSV, Flu A&B, Covid) Anterior Nasal Swab     Status: None   Collection Time: 12/20/23  6:07 PM   Specimen: Anterior Nasal Swab  Result Value Ref Range Status   SARS Coronavirus 2 by RT PCR NEGATIVE NEGATIVE Final   Influenza A by PCR NEGATIVE NEGATIVE Final   Influenza B by PCR NEGATIVE NEGATIVE Final    Comment: (NOTE) The Xpert Xpress SARS-CoV-2/FLU/RSV plus assay is intended as an aid in the diagnosis of influenza from Nasopharyngeal swab specimens and should not be used as a sole basis for treatment. Nasal washings and aspirates are unacceptable for Xpert Xpress SARS-CoV-2/FLU/RSV testing.  Fact Sheet for Patients: BloggerCourse.com  Fact Sheet for Healthcare  Providers: SeriousBroker.it  This test is not yet approved or cleared by the [place]  FDA and has been authorized for detection and/or diagnosis of SARS-CoV-2 by FDA under an Emergency Use Authorization (EUA). This EUA will remain in effect (meaning this test can be used) for the duration of the COVID-19 declaration under Section 564(b)(1) of the Act, 21 U.S.C. section 360bbb-3(b)(1), unless the authorization is terminated or revoked.  Resp Syncytial Virus by PCR NEGATIVE NEGATIVE Final    Comment: (NOTE) Fact Sheet for Patients: BloggerCourse.com  Fact Sheet for Healthcare Providers: SeriousBroker.it  This test is not yet approved or cleared by the [place]  FDA and has been authorized for detection and/or diagnosis of SARS-CoV-2 by FDA under an Emergency Use Authorization (EUA). This EUA will remain in effect (meaning this test can be used) for the duration of the COVID-19 declaration under Section 564(b)(1) of the Act, 21 U.S.C. section 360bbb-3(b)(1), unless the authorization is terminated or revoked.  Performed at [facility], [address], [place], [place] [address]      Radiological Exams on Admission: DG Chest Port 1 View Result Date: 12/20/2023 CLINICAL DATA:  Shortness of breath. EXAM: PORTABLE CHEST 1 VIEW COMPARISON:  02/21/2023. FINDINGS: Cardiomegaly. Stable left chest wall pacemaker. Moderate-sized left pleural effusion with associated left basilar consolidation/atelectasis. Bilateral interstitial prominence is favored to reflect interstitial pulmonary edema. No pneumothorax. No acute osseous abnormality. IMPRESSION: 1. Moderate-sized left pleural effusion with associated left basilar consolidation/atelectasis. 2. Cardiomegaly with interstitial pulmonary edema. Electronically Signed   By: Mannie Seek M.D.   On: 12/20/2023 19:39    EKG: Independently reviewed. Atrial  fibrillation, non-specific IVCD with LAD.   Assessment/Plan   1. Acute on chronic HFpEF; acute hypoxic respiratory failure  - Continue diuresis with IV Lasix , monitor weight and I/Os, monitor renal function and electrolytes, update echocardiogram    2. Elevated troponin  - Patient denies any recent chest pain  - Continue cardiac monitoring, trend troponin, follow-up echo findings, continue IV heparin  for now    3. Elevated LFTs  - New from June 2024, possibly congestive hepatopathy  - Check RUQ US , lipase, APAP level, EtOH level, and INR, repeat CMP in am    4. AKI superimposed on CKD 3B  - Renally-dose medications, avoid hypotension, monitor closely while diuresing    5. Lactic acidosis  - No infectious s/s, downtrending, cardiogenic shock considered but SBP has been >100 and extremities are not cool  - Trend lactate, check procalcitonin, follow cultures and clinical course, hold further antibiotics for now, may need inotropes but will defer to cardiology    6. Thrombocytopenia  - Smear review requested, repeat CBC in am, follow closely while starting IV heparin  infusion   7. Atrial fibrillation  - Currently on IV heparin , rate is controlled    8. Type II DM  - Check CBGs, use SSI for now   9. COPD  - Not in exacerbation  - Bronchodilators as-needed     DVT prophylaxis: IV heparin   Code Status: Full  Level of Care: Level of care: Progressive Family Communication: Husband at bedside  Disposition Plan:  Patient is from: Home  Anticipated d/c is to: TBD Anticipated d/c date is: 12/23/23  Patient currently: Pending improved volume status, echo, RUQ US , improved/stable renal function and LFTs  Consults called: Cardiology  Admission status: Inpatient     Walton Guppy, MD [facility]  12/20/2023, 9:03 PM

## 2023-12-21 ENCOUNTER — Other Ambulatory Visit (HOSPITAL_COMMUNITY): Payer: Self-pay

## 2023-12-21 ENCOUNTER — Inpatient Hospital Stay (HOSPITAL_COMMUNITY)

## 2023-12-21 DIAGNOSIS — E66813 Obesity, class 3: Secondary | ICD-10-CM | POA: Insufficient documentation

## 2023-12-21 DIAGNOSIS — I4819 Other persistent atrial fibrillation: Secondary | ICD-10-CM | POA: Diagnosis not present

## 2023-12-21 DIAGNOSIS — E785 Hyperlipidemia, unspecified: Secondary | ICD-10-CM

## 2023-12-21 DIAGNOSIS — I5033 Acute on chronic diastolic (congestive) heart failure: Secondary | ICD-10-CM | POA: Diagnosis not present

## 2023-12-21 DIAGNOSIS — I10 Essential (primary) hypertension: Secondary | ICD-10-CM | POA: Diagnosis not present

## 2023-12-21 DIAGNOSIS — N179 Acute kidney failure, unspecified: Secondary | ICD-10-CM | POA: Diagnosis not present

## 2023-12-21 DIAGNOSIS — G2581 Restless legs syndrome: Secondary | ICD-10-CM

## 2023-12-21 DIAGNOSIS — I5031 Acute diastolic (congestive) heart failure: Secondary | ICD-10-CM

## 2023-12-21 DIAGNOSIS — E1169 Type 2 diabetes mellitus with other specified complication: Secondary | ICD-10-CM

## 2023-12-21 HISTORY — DX: Obesity, class 3: E66.813

## 2023-12-21 HISTORY — DX: Morbid (severe) obesity due to excess calories: E66.01

## 2023-12-21 HISTORY — DX: Essential (primary) hypertension: I10

## 2023-12-21 LAB — BLOOD CULTURE ID PANEL (REFLEXED) - BCID2

## 2023-12-21 LAB — CBC
HCT: 46.8 % — ABNORMAL HIGH (ref 36.0–46.0)
Hemoglobin: 13.6 g/dL (ref 12.0–15.0)
MCH: 27 pg (ref 26.0–34.0)
MCHC: 29.1 g/dL — ABNORMAL LOW (ref 30.0–36.0)
MCV: 93 fL (ref 80.0–100.0)
Platelets: 105 10*3/uL — ABNORMAL LOW (ref 150–400)
RBC: 5.03 MIL/uL (ref 3.87–5.11)
RDW: 20.2 % — ABNORMAL HIGH (ref 11.5–15.5)
WBC: 6.3 10*3/uL (ref 4.0–10.5)
nRBC: 0.5 % — ABNORMAL HIGH (ref 0.0–0.2)

## 2023-12-21 LAB — ECHOCARDIOGRAM COMPLETE
Height: 63 in
S' Lateral: 2.8 cm
Weight: 5068.82 [oz_av]

## 2023-12-21 LAB — TROPONIN I (HIGH SENSITIVITY): Troponin I (High Sensitivity): 442 ng/L (ref <18)

## 2023-12-21 LAB — PROTIME-INR
INR: 2.1 — ABNORMAL HIGH (ref 0.8–1.2)
Prothrombin Time: 23.9 s — ABNORMAL HIGH (ref 11.4–15.2)

## 2023-12-21 LAB — GLUCOSE, CAPILLARY
Glucose-Capillary: 141 mg/dL — ABNORMAL HIGH (ref 70–99)
Glucose-Capillary: 154 mg/dL — ABNORMAL HIGH (ref 70–99)
Glucose-Capillary: 163 mg/dL — ABNORMAL HIGH (ref 70–99)
Glucose-Capillary: 166 mg/dL — ABNORMAL HIGH (ref 70–99)
Glucose-Capillary: 167 mg/dL — ABNORMAL HIGH (ref 70–99)
Glucose-Capillary: 213 mg/dL — ABNORMAL HIGH (ref 70–99)

## 2023-12-21 LAB — DIFFERENTIAL
Abs Immature Granulocytes: 0.04 10*3/uL (ref 0.00–0.07)
Basophils Absolute: 0 10*3/uL (ref 0.0–0.1)
Basophils Relative: 0 %
Eosinophils Absolute: 0 10*3/uL (ref 0.0–0.5)
Eosinophils Relative: 0 %
Immature Granulocytes: 1 %
Lymphocytes Relative: 5 %
Lymphs Abs: 0.3 10*3/uL — ABNORMAL LOW (ref 0.7–4.0)
Monocytes Absolute: 0.1 10*3/uL (ref 0.1–1.0)
Monocytes Relative: 1 %
Neutro Abs: 5.8 10*3/uL (ref 1.7–7.7)
Neutrophils Relative %: 93 %

## 2023-12-21 LAB — PROCALCITONIN: Procalcitonin: 0.56 ng/mL

## 2023-12-21 LAB — COMPREHENSIVE METABOLIC PANEL WITH GFR
ALT: 834 U/L — ABNORMAL HIGH (ref 0–44)
AST: 1449 U/L — ABNORMAL HIGH (ref 15–41)
Albumin: 2.5 g/dL — ABNORMAL LOW (ref 3.5–5.0)
Alkaline Phosphatase: 84 U/L (ref 38–126)
Anion gap: 10 (ref 5–15)
BUN: 34 mg/dL — ABNORMAL HIGH (ref 8–23)
CO2: 30 mmol/L (ref 22–32)
Calcium: 8.2 mg/dL — ABNORMAL LOW (ref 8.9–10.3)
Chloride: 99 mmol/L (ref 98–111)
Creatinine, Ser: 2.88 mg/dL — ABNORMAL HIGH (ref 0.44–1.00)
GFR, Estimated: 16 mL/min — ABNORMAL LOW (ref >60)
Glucose, Bld: 157 mg/dL — ABNORMAL HIGH (ref 70–99)
Potassium: 5.2 mmol/L — ABNORMAL HIGH (ref 3.5–5.1)
Sodium: 139 mmol/L (ref 135–145)
Total Bilirubin: 2.1 mg/dL — ABNORMAL HIGH (ref 0.0–1.2)
Total Protein: 5.1 g/dL — ABNORMAL LOW (ref 6.5–8.1)

## 2023-12-21 LAB — LACTIC ACID, PLASMA: Lactic Acid, Venous: 1.9 mmol/L (ref 0.5–1.9)

## 2023-12-21 LAB — HEPARIN LEVEL (UNFRACTIONATED)
Heparin Unfractionated: <0.1 [IU]/mL — ABNORMAL LOW (ref 0.30–0.70)
Heparin Unfractionated: 0.19 [IU]/mL — ABNORMAL LOW (ref 0.30–0.70)

## 2023-12-21 LAB — HEMOGLOBIN A1C
Hgb A1c MFr Bld: 6.9 % — ABNORMAL HIGH (ref 4.8–5.6)
Mean Plasma Glucose: 151.33 mg/dL

## 2023-12-21 LAB — TECHNOLOGIST SMEAR REVIEW: Plt Morphology: UNDETERMINED

## 2023-12-21 LAB — MAGNESIUM: Magnesium: 2.5 mg/dL — ABNORMAL HIGH (ref 1.7–2.4)

## 2023-12-21 MED ORDER — GABAPENTIN 300 MG PO CAPS
600.0000 mg | ORAL_CAPSULE | Freq: Every day | ORAL | Status: DC
  Administered 2023-12-21 – 2023-12-25 (×5): 600 mg via ORAL
  Filled 2023-12-21 (×5): qty 2

## 2023-12-21 MED ORDER — FUROSEMIDE 10 MG/ML IJ SOLN
80.0000 mg | Freq: Two times a day (BID) | INTRAMUSCULAR | Status: DC
  Administered 2023-12-21 – 2023-12-27 (×12): 80 mg via INTRAVENOUS
  Filled 2023-12-21 (×12): qty 8

## 2023-12-21 MED ORDER — METOPROLOL SUCCINATE ER 25 MG PO TB24
25.0000 mg | ORAL_TABLET | Freq: Every day | ORAL | Status: DC
  Administered 2023-12-21 – 2023-12-29 (×9): 25 mg via ORAL
  Filled 2023-12-21 (×10): qty 1

## 2023-12-21 MED ORDER — AMIODARONE HCL 200 MG PO TABS
200.0000 mg | ORAL_TABLET | Freq: Every day | ORAL | Status: DC
  Administered 2023-12-21 – 2023-12-22 (×2): 200 mg via ORAL
  Filled 2023-12-21 (×2): qty 1

## 2023-12-21 MED ORDER — FERROUS SULFATE 325 (65 FE) MG PO TABS
325.0000 mg | ORAL_TABLET | Freq: Every day | ORAL | Status: DC
  Administered 2023-12-21 – 2023-12-28 (×8): 325 mg via ORAL
  Filled 2023-12-21 (×8): qty 1

## 2023-12-21 MED ORDER — APIXABAN 5 MG PO TABS: 5.0000 mg | ORAL_TABLET | Freq: Two times a day (BID) | ORAL | Status: DC

## 2023-12-21 MED ORDER — APIXABAN 2.5 MG PO TABS
2.5000 mg | ORAL_TABLET | Freq: Two times a day (BID) | ORAL | Status: DC
  Administered 2023-12-21 – 2023-12-24 (×7): 2.5 mg via ORAL
  Filled 2023-12-21 (×7): qty 1

## 2023-12-21 MED ORDER — FUROSEMIDE 10 MG/ML IJ SOLN
40.0000 mg | Freq: Once | INTRAMUSCULAR | Status: AC
  Administered 2023-12-21: 40 mg via INTRAVENOUS
  Filled 2023-12-21: qty 4

## 2023-12-21 MED ORDER — HEPARIN BOLUS VIA INFUSION
2000.0000 [IU] | Freq: Once | INTRAVENOUS | Status: AC
  Administered 2023-12-21: 2000 [IU] via INTRAVENOUS
  Filled 2023-12-21: qty 2000

## 2023-12-21 MED ORDER — ROPINIROLE HCL 1 MG PO TABS
4.0000 mg | ORAL_TABLET | Freq: Every morning | ORAL | Status: DC
  Administered 2023-12-22 – 2023-12-26 (×5): 4 mg via ORAL
  Filled 2023-12-21 (×5): qty 4

## 2023-12-21 NOTE — Assessment & Plan Note (Signed)
 Calculated BMI is 56.1

## 2023-12-21 NOTE — Plan of Care (Signed)
   Problem: Coping: Goal: Ability to adjust to condition or change in health will improve Outcome: Progressing   Problem: Fluid Volume: Goal: Ability to maintain a balanced intake and output will improve Outcome: Progressing   Problem: Health Behavior/Discharge Planning: Goal: Ability to identify and utilize available resources and services will improve Outcome: Progressing

## 2023-12-21 NOTE — TOC Benefit Eligibility Note (Signed)
 Pharmacy Patient Advocate Encounter  Insurance verification completed.    The patient is insured through [employer] Part D. Patient has Medicare and is not eligible for a copay card, but may be able to apply for patient assistance or Medicare RX Payment Plan (Patient Must reach out to their plan, if eligible for payment plan), if available.    Ran test claim for Eliquis  5mg  and the current 30 day co-pay is $346.76 ($265.65 deductible and $81.11 coinsurance).   This test claim was processed through [facility]- copay amounts may vary at other pharmacies due to pharmacy/plan contracts, or as the patient moves through the different stages of their insurance plan.

## 2023-12-21 NOTE — Progress Notes (Signed)
  Progress Note  Patient Name: Holly Spence Date of Encounter: 12/21/2023 [facility] Cardiologist: Nelia Balzarine, MD    Interval Summary   78 yo with atrial fib, pacer, AKI,  COPD   I/O are not recorded accurately   Echo from this morning reveals normal LVEF of 60 to 65%. Trivial mitral regurgitation   Vital Signs Vitals:   12/21/23 0003 12/21/23 0405 12/21/23 0500 12/21/23 0723  BP: (!) 110/47 91/64  (!) 117/54  Pulse: 64   (!) 59  Resp: 17   20  Temp: 97.9 F (36.6 C) 98.2 F (36.8 C)  97.8 F (36.6 C)  TempSrc: Axillary Axillary  Oral  SpO2: 90% 92%  91%  Weight:   (!) 143.7 kg   Height:        Intake/Output Summary (Last 24 hours) at 12/21/2023 1110 Last data filed at 12/21/2023 0981 Gross per 24 hour  Intake 220.29 ml  Output --  Net 220.29 ml      12/21/2023    5:00 AM 12/20/2023   10:42 PM 03/05/2023    2:55 PM  Last 3 Weights  Weight (lbs) 316 lb 12.8 oz 316 lb 12.8 oz 295 lb 6.4 oz  Weight (kg) 143.7 kg 143.7 kg 133.993 kg      Telemetry/ECG  AV paced  - Personally Reviewed  Physical Exam  GEN: morbidly obsese female  Neck: No JVD Cardiac: RR , distant heart sound s Respiratory: wheezing, rhonchi   GI: Soft, nontender, non-distended  MS:  1-2 + leg edema   Assessment & Plan    Hypercarbic respiratory failure :   likely due to obesity hypoventilation,  chronic diastolic CHF   Latest Reference Range & Units 12/20/23 18:49  pH, Ven 7.25 - 7.43  7.335  pCO2, Ven 44 - 60 mmHg 56.8  pO2, Ven 32 - 45 mmHg 118 (H)  TCO2 22 - 32 mmol/L 22 - 32 mmol/L 30 32  Acid-Base Excess 0.0 - 2.0 mmol/L 3.0 (H)  Bicarbonate 20.0 - 28.0 mmol/L 30.3 (H)  O2 Saturation % 98  (H): Data is abnormally high  The ABG is listed at a venous sample but the pO2 is 118 so I suspect this was an arterial sample   She has normal LV function .  She may benefit from a pulmonary consultation .  I suspect she would benefit from CPAP or BIPAP at night and  perhaps also during the day   2.  Elevated Troponins:  flat trend.  Not c/w ACS .  Possibly due to demand ischemia       3.   Liver enzyme elevation :   plans per IM team .  ? Due to     For questions or updates, please contact [facility] Please consult [email or web address] for contact info under       Signed, Ahmad Alert, MD

## 2023-12-21 NOTE — Assessment & Plan Note (Signed)
 No signs of acute exacerbation, continue with bronchodilator therapy Continue 02 monitoring and supplemental 02 to keep 02 saturation 92% or greater.,  On ICS and LABA.

## 2023-12-21 NOTE — Assessment & Plan Note (Signed)
 Ventricular paced rhythm.   Will resume metoprolol  and amiodarone .  Resume anticoagulation with apixaban .,

## 2023-12-21 NOTE — Assessment & Plan Note (Signed)
Cell count stable.  

## 2023-12-21 NOTE — TOC CM/SW Note (Signed)
 Transition of Care [facility]) - Inpatient Brief Assessment   Patient Details  Name: Holly Spence MRN: [id number] Date of Birth: 09-12-1945  Transition of Care [facility]) CM/SW Contact:    Jennett Model, RN Phone Number: 12/21/2023, 4:15 PM   Clinical Narrative: From home with spouse, has PCP, Dr. Darlen Eglin and insurance on file, states has no HH services in place at this time, has walker, cane and power chair at home.  States family member will transport them home at dc if she gets better but if not may need ambulance transport home and family is support system, states gets medications from [facility] in [place].  Pta self ambulatory walker.  Patient gives this NCM permission to speak with spouse.    Transition of Care Asessment: Insurance and Status: Insurance coverage has been reviewed Patient has primary care physician: Yes Home environment has been reviewed: home with spouse Prior level of function:: ambulatory with walker Prior/Current Home Services: Current home services (walker, cane, powerchair) Social Drivers of Health Review: SDOH reviewed no interventions necessary Readmission risk has been reviewed: Yes Transition of care needs: transition of care needs identified, TOC will continue to follow

## 2023-12-21 NOTE — Assessment & Plan Note (Signed)
Continue with ropinirole

## 2023-12-21 NOTE — Progress Notes (Signed)
 PHARMACY - PHYSICIAN COMMUNICATION CRITICAL VALUE ALERT - BLOOD CULTURE IDENTIFICATION (BCID)  Holly Spence is an 78 y.o. female who presented to [facility] on 12/20/2023 with a chief complaint of HF   Assessment:  WBC WNL afebrile no antibiotics needs - probable contaminant   Name of physician (or Provider) Contacted: [facility]    Hortensia Ma Pharm.D. CPP, BCPS Clinical Pharmacist [phone] 12/21/2023 7:45 PM      Results for orders placed or performed during the hospital encounter of 12/20/23  Blood Culture ID Panel (Reflexed) (Collected: 12/20/2023  9:23 PM)  Result Value Ref Range   Enterococcus faecalis NOT DETECTED NOT DETECTED   Enterococcus Faecium NOT DETECTED NOT DETECTED   Listeria monocytogenes NOT DETECTED NOT DETECTED   Staphylococcus species DETECTED (A) NOT DETECTED   Staphylococcus aureus (BCID) NOT DETECTED NOT DETECTED   Staphylococcus epidermidis DETECTED (A) NOT DETECTED   Staphylococcus lugdunensis NOT DETECTED NOT DETECTED   Streptococcus species NOT DETECTED NOT DETECTED   Streptococcus agalactiae NOT DETECTED NOT DETECTED   Streptococcus pneumoniae NOT DETECTED NOT DETECTED   Streptococcus pyogenes NOT DETECTED NOT DETECTED   A.calcoaceticus-baumannii NOT DETECTED NOT DETECTED   Bacteroides fragilis NOT DETECTED NOT DETECTED   Enterobacterales NOT DETECTED NOT DETECTED   Enterobacter cloacae complex NOT DETECTED NOT DETECTED   Escherichia coli NOT DETECTED NOT DETECTED   Klebsiella aerogenes NOT DETECTED NOT DETECTED   Klebsiella oxytoca NOT DETECTED NOT DETECTED   Klebsiella pneumoniae NOT DETECTED NOT DETECTED   Proteus species NOT DETECTED NOT DETECTED   Salmonella species NOT DETECTED NOT DETECTED   Serratia marcescens NOT DETECTED NOT DETECTED   Haemophilus influenzae NOT DETECTED NOT DETECTED   Neisseria meningitidis NOT DETECTED NOT DETECTED   Pseudomonas aeruginosa NOT DETECTED NOT DETECTED   Stenotrophomonas maltophilia NOT DETECTED NOT  DETECTED   Candida albicans NOT DETECTED NOT DETECTED   Candida auris NOT DETECTED NOT DETECTED   Candida glabrata NOT DETECTED NOT DETECTED   Candida krusei NOT DETECTED NOT DETECTED   Candida parapsilosis NOT DETECTED NOT DETECTED   Candida tropicalis NOT DETECTED NOT DETECTED   Cryptococcus neoformans/gattii NOT DETECTED NOT DETECTED   Methicillin resistance mecA/C DETECTED (A) NOT DETECTED

## 2023-12-21 NOTE — Assessment & Plan Note (Signed)
 Continue glucose cover and monitoring with insulin  sliding scale.  Patient is tolerating po well.  Fasting glucose this am 146 mg/dl

## 2023-12-21 NOTE — Assessment & Plan Note (Signed)
 Hyperkalemia.   Renal function with serum cr at 2,88 with K at 5,2 and serum bicarbonate at 30  Na 139

## 2023-12-21 NOTE — Assessment & Plan Note (Signed)
 Continue blood pressure control with metoprolol ,  Continue diuresis with IV furosemide 

## 2023-12-21 NOTE — Assessment & Plan Note (Addendum)
 Echocardiogram with preserved LV systolic function with EF 60 to 65%, mild LVH, RV not well visualized, trivial pericardial effusion.   Patient continue volume overloaded.  Lactic acid is trending down.  Urine output 2,400 ml   Continue diuresis with furosemide , 80 mg IV bid Continue blood pressure monitoring   High sensitive troponin elevation due to heart failure. No signs of acute coronary syndrome,.  Acute cardiogenic pulmonary edema, continue diuresis with furosemide   No clinical signs of pneumonia, hold on antibiotic therapy. (Pneumonia ruled out).  Her 02 saturation today is 91 % on 2 L/min   Congestive hepatopathy, continue diuresis and follow up LFT in am.  AST and ALT are trending down.  No encephalopathy or clinical signs of liver failure.

## 2023-12-21 NOTE — Plan of Care (Signed)

## 2023-12-21 NOTE — Progress Notes (Addendum)
 Progress Note   Patient: Holly Spence ZOX:[id number] DOB: 04/09/1946 DOA: 12/20/2023     1 DOS: the patient was seen and examined on 12/21/2023   Brief hospital course: Holly Spence was admitted to the hospital with the working diagnosis of heart failure exacerbation   78 yo female with the past medical history of atrial fibrillation, T2DM, COPD,CKD and heart failure who presented with dyspnea.  Reported 3 weeks of dyspnea, associated with lower extremity edema, and non productive cough. On her initial physical examination her blood pressure was 113/84, HR 63, RR 13 and 02 saturation 94% on supplemental 02 per [place] Lungs with no wheezing or rhonchi, increased work of breathing, heart with S1 and S2 present and regular with no gallops, rubs or murmurs, abdomen with no distention and positive bilateral lower extremity edema.    Na 141, K 5.4 Cl 99, bicarbonate 27 glucose 78 bun 34 cr 3,0  AST 1,744 Spence 844  BNP 1,019  High sensitive troponin 410 and 422 Lactic acid 4,9, 3,6 and 1,9   Wbc 9,2 hgb 14.1 plt 113 SARS covid 19 negative Influenza negative RSV negative   Chest radiograph with hypoinflation, positive cardiomegaly, with bilateral hilar vascular congestion with bilateral interstitial infiltrates at the lower zones with left pleural effusion. Pacemaker in place with one right atrial and one right ventricular lead.   EKG 64 bpm, left axis deviation with left bundle brunch block, qtc 523, ventricular paced rhythm with no significant ST segment or  T wave changes.   Assessment and Plan: * Acute on chronic diastolic CHF (congestive heart failure) (HCC) Echocardiogram with preserved LV systolic function with EF 60 to 65%, mild LVH, RV not well visualized, trivial pericardial effusion.   Patient continue volume overloaded.  Lactic acid is trending down.   Continue diuresis with furosemide , will increase dose to 80 mg IV bid Continue blood pressure monitoring   High sensitive troponin  elevation due to heart failure. No signs of acute coronary syndrome, will stop heparin  IV   Acute cardiogenic pulmonary edema, continue diuresis with furosemide   No clinical signs of pneumonia, hold on antibiotic therapy. (Pneumonia ruled out).  Her 02 saturation today is 91 % on 2 L/min   Congestive hepatopathy, continue diuresis and follow up LFT in am.  No encephalopathy or clinical signs of liver failure.   Persistent atrial fibrillation (HCC) Ventricular paced rhythm.   Will resume metoprolol  and amiodarone .  Resume anticoagulation with apixaban .,   Essential hypertension Continue blood pressure control with metoprolol ,  Continue diuresis with IV furosemide    Acute renal failure superimposed on stage 3b chronic kidney disease (HCC) Hyperkalemia.   Renal function with serum cr at 2,88 with K at 5,2 and serum bicarbonate at 30  Na 139   Thrombocytopenia (HCC) Cell count stable.   Type 2 diabetes mellitus with hyperlipidemia (HCC) Continue glucose cover and monitoring with insulin  sliding scale.  Patient is tolerating po well.   COPD (chronic obstructive pulmonary disease) (HCC) No signs of acute exacerbation, continue with bronchodilator therapy Continue 02 monitoring and supplemental 02 to keep 02 saturation 92% or greater.,   Restless leg syndrome Continue with ropinirole .   Obesity, class 3 Calculated BMI is 56.1     Subjective: patient continue to have dyspnea and cough, continue to have edema lower extremities.   Physical Exam: Vitals:   12/21/23 0500 12/21/23 0723 12/21/23 1121 12/21/23 1131  BP:  (!) 117/54 108/64   Pulse:  (!) 59 75 73  Resp:  20 17 (!) 23  Temp:  97.8 F (36.6 C) 97.7 F (36.5 C)   TempSrc:  Oral Oral   SpO2:  91% 91%   Weight: (!) 143.7 kg     Height:       Neurology awake and alert ENT with mild pallor Cardiovascular with S1 and S2 present and regular with no gallops, or rubs, positive systolic murmur at the right lower  sternal border No JVD, wide neck Respiratory with rales bilaterally with rhonchi with no wheezing Abdomen protuberant but not distended and non tender Positive lower extremity edema pitting ++++ up to the hips.  Data Reviewed:    Family Communication: no family at the bedside   Disposition: Status is: Inpatient Remains inpatient appropriate because: IV diuresis   Planned Discharge Destination: Home     Author: Albertus Alt, MD 12/21/2023 11:34 AM  For on call review [email or web address].

## 2023-12-21 NOTE — Hospital Course (Addendum)
 Holly Spence was admitted to the hospital with the working diagnosis of heart failure exacerbation   78 yo female with the past medical history of atrial fibrillation, T2DM, COPD,CKD and heart failure who presented with dyspnea.  Reported 3 weeks of dyspnea, associated with lower extremity edema, and non productive cough. On her initial physical examination her blood pressure was 113/84, HR 63, RR 13 and 02 saturation 94% on supplemental 02 per [place] Lungs with no wheezing or rhonchi, increased work of breathing, heart with S1 and S2 present and regular with no gallops, rubs or murmurs, abdomen with no distention and positive bilateral lower extremity edema.    Na 141, K 5.4 Cl 99, bicarbonate 27 glucose 78 bun 34 cr 3,0  AST 1,744 ALT 844  BNP 1,019  High sensitive troponin 410 and 422 Lactic acid 4,9, 3,6 and 1,9   Wbc 9,2 hgb 14.1 plt 113 SARS covid 19 negative Influenza negative RSV negative   Chest radiograph with hypoinflation, positive cardiomegaly, with bilateral hilar vascular congestion with bilateral interstitial infiltrates at the lower zones with left pleural effusion. Pacemaker in place with one right atrial and one right ventricular lead.   EKG 64 bpm, left axis deviation with left bundle brunch block, qtc 523, ventricular paced rhythm with no significant ST segment or  T wave changes.   06/14 clinically improving but continue volume overloaded.  06/15 continue diuresis  06/17 volume status is improving, but continue with significant volume overload.

## 2023-12-21 NOTE — Progress Notes (Signed)
 Heart Failure Navigator Progress Note  Assessed for Heart & Vascular TOC clinic readiness.  Patient does not meet criteria due to EF 60-65%, No HF TOC per Dr. Sunnie England , will follow up with [facility]. .   Navigator will sign off at this time.   [name], BSN, Scientist, clinical (histocompatibility and immunogenetics) Only

## 2023-12-21 NOTE — Progress Notes (Signed)
 PHARMACY - ANTICOAGULATION  Pharmacy Consult for heparin   Indication: chest pain/ACS Brief A/P: Heparin  level subtherapeutic Increase Heparin  rate  Allergies  Allergen Reactions   Penicillins Hives and Other (See Comments)    At injection site only- hives   Sulfa Antibiotics Hives   Erythromycin Other (See Comments)    Unsure    Azithromycin Rash and Other (See Comments)    Broke out the legs   Ciprofloxacin Hcl Other (See Comments)    Abdominal Pain    Patient Measurements: Height: 5' 3 (160 cm) Weight: (!) 143.7 kg (316 lb 12.8 oz) IBW/kg (Calculated) : 52.4 HEPARIN  DW (KG): 89 Heparin  dosing wt: 86.7kg  Vital Signs: Temp: 98.2 F (36.8 C) (06/13 0405) Temp Source: Axillary (06/13 0405) BP: 91/64 (06/13 0405) Pulse Rate: 64 (06/13 0003)  Labs: Recent Labs    12/20/23 1815 12/20/23 1849 12/20/23 2016 12/21/23 0241  HGB 14.1 17.3*  17.0*  --  13.6  HCT 50.6* 51.0*  50.0*  --  46.8*  PLT 113*  --   --  105*  LABPROT  --   --   --  23.9*  INR  --   --   --  2.1*  HEPARINUNFRC  --   --   --  <0.10*  CREATININE 3.01* 2.90*  --  2.88*  TROPONINIHS 410*  --  425*  --     Estimated Creatinine Clearance: 21.9 mL/min (A) (by C-G formula based on SCr of 2.88 mg/dL (H)).  Assessment: 78 y.o. female with Afib and elevated troponin for heparin   Goal of Therapy:  Heparin  level 0.3-0.7 units/ml Monitor platelets by anticoagulation protocol: Yes   Plan:  Heparin  2000 units IV bolus, then increase heparin   1500 units/hr Check heparin  level in 8 hours.   Holly Spence 12/21/2023,4:27 AM

## 2023-12-22 ENCOUNTER — Other Ambulatory Visit (HOSPITAL_COMMUNITY): Payer: Self-pay

## 2023-12-22 DIAGNOSIS — I10 Essential (primary) hypertension: Secondary | ICD-10-CM | POA: Diagnosis not present

## 2023-12-22 DIAGNOSIS — I5033 Acute on chronic diastolic (congestive) heart failure: Secondary | ICD-10-CM | POA: Diagnosis not present

## 2023-12-22 DIAGNOSIS — I4819 Other persistent atrial fibrillation: Secondary | ICD-10-CM | POA: Diagnosis not present

## 2023-12-22 DIAGNOSIS — N179 Acute kidney failure, unspecified: Secondary | ICD-10-CM | POA: Diagnosis not present

## 2023-12-22 DIAGNOSIS — R7401 Elevation of levels of liver transaminase levels: Secondary | ICD-10-CM

## 2023-12-22 HISTORY — DX: Elevation of levels of liver transaminase levels: R74.01

## 2023-12-22 LAB — COMPREHENSIVE METABOLIC PANEL WITH GFR
ALT: 661 U/L — ABNORMAL HIGH (ref 0–44)
AST: 513 U/L — ABNORMAL HIGH (ref 15–41)
Albumin: 2.7 g/dL — ABNORMAL LOW (ref 3.5–5.0)
Alkaline Phosphatase: 91 U/L (ref 38–126)
Anion gap: 10 (ref 5–15)
BUN: 45 mg/dL — ABNORMAL HIGH (ref 8–23)
CO2: 31 mmol/L (ref 22–32)
Calcium: 8 mg/dL — ABNORMAL LOW (ref 8.9–10.3)
Chloride: 98 mmol/L (ref 98–111)
Creatinine, Ser: 2.73 mg/dL — ABNORMAL HIGH (ref 0.44–1.00)
GFR, Estimated: 17 mL/min — ABNORMAL LOW (ref >60)
Glucose, Bld: 146 mg/dL — ABNORMAL HIGH (ref 70–99)
Potassium: 5.1 mmol/L (ref 3.5–5.1)
Sodium: 139 mmol/L (ref 135–145)
Total Bilirubin: 1 mg/dL (ref 0.0–1.2)
Total Protein: 5.2 g/dL — ABNORMAL LOW (ref 6.5–8.1)

## 2023-12-22 LAB — CBC
HCT: 44.8 % (ref 36.0–46.0)
Hemoglobin: 13 g/dL (ref 12.0–15.0)
MCH: 26.5 pg (ref 26.0–34.0)
MCHC: 29 g/dL — ABNORMAL LOW (ref 30.0–36.0)
MCV: 91.2 fL (ref 80.0–100.0)
Platelets: 115 10*3/uL — ABNORMAL LOW (ref 150–400)
RBC: 4.91 MIL/uL (ref 3.87–5.11)
RDW: 20.5 % — ABNORMAL HIGH (ref 11.5–15.5)
WBC: 5.8 10*3/uL (ref 4.0–10.5)
nRBC: 0.3 % — ABNORMAL HIGH (ref 0.0–0.2)

## 2023-12-22 LAB — GLUCOSE, CAPILLARY
Glucose-Capillary: 173 mg/dL — ABNORMAL HIGH (ref 70–99)
Glucose-Capillary: 129 mg/dL — ABNORMAL HIGH (ref 70–99)
Glucose-Capillary: 112 mg/dL — ABNORMAL HIGH (ref 70–99)
Glucose-Capillary: 148 mg/dL — ABNORMAL HIGH (ref 70–99)
Glucose-Capillary: 135 mg/dL — ABNORMAL HIGH (ref 70–99)
Glucose-Capillary: 147 mg/dL — ABNORMAL HIGH (ref 70–99)

## 2023-12-22 MED ORDER — GERHARDT'S BUTT CREAM
TOPICAL_CREAM | Freq: Two times a day (BID) | CUTANEOUS | Status: DC
  Administered 2023-12-22: 1 via TOPICAL
  Filled 2023-12-22: qty 60

## 2023-12-22 MED ORDER — IPRATROPIUM-ALBUTEROL 0.5-2.5 (3) MG/3ML IN SOLN
3.0000 mL | Freq: Four times a day (QID) | RESPIRATORY_TRACT | Status: DC
  Administered 2023-12-22 – 2023-12-23 (×2): 3 mL via RESPIRATORY_TRACT
  Filled 2023-12-22 (×2): qty 3

## 2023-12-22 MED ORDER — ORAL CARE MOUTH RINSE: 15.0000 mL | OROMUCOSAL | Status: DC | PRN

## 2023-12-22 MED ORDER — NYSTATIN 100000 UNIT/GM EX POWD
Freq: Two times a day (BID) | CUTANEOUS | Status: DC
  Filled 2023-12-22: qty 15

## 2023-12-22 MED ORDER — ALBUTEROL SULFATE (2.5 MG/3ML) 0.083% IN NEBU
2.5000 mg | INHALATION_SOLUTION | RESPIRATORY_TRACT | Status: DC | PRN
  Administered 2023-12-22: 2.5 mg via RESPIRATORY_TRACT

## 2023-12-22 MED ORDER — FLUTICASONE FUROATE-VILANTEROL 200-25 MCG/ACT IN AEPB
1.0000 | INHALATION_SPRAY | Freq: Every day | RESPIRATORY_TRACT | Status: DC
  Administered 2023-12-22 – 2023-12-29 (×8): 1 via RESPIRATORY_TRACT
  Filled 2023-12-22: qty 28

## 2023-12-22 NOTE — Evaluation (Signed)
 Occupational Therapy Evaluation Patient Details Name: Holly Spence MRN: [id number] DOB: 1945-07-18 Today's Date: 12/22/2023   History of Present Illness   Patient is 78 yo female admitted on 12/20/23 for acute hypoxic respiratory failure and acute on chronic diastolic heart failure. PMH significant for T2DM, Afib, HTN, HTN, OSA, PVD, s/p pacemaker placement, GERD, COPD, and LBP.     Clinical Impressions Pt currently at max assist for stand pivot transfer with max +2 for LB selfcare sit to stand for LB selfcare.  Prior to admission pt was needing assist from her spouse for sponge baths and transfers from her power chair into her bedroom and bathroom.  Recently she had become so week she had multiple falls and increased fear of falling.  Feel she will benefit from acute care OT at this time in order to progress ADL independence.  Pt's spouse cannot provide heavy physical assist at this time so recommend she will benefit from continued inpatient follow up therapy, <3 hours/day prior to discharge home in order to reach a level he can safely manage.      If plan is discharge home, recommend the following:   A lot of help with walking and/or transfers;A lot of help with bathing/dressing/bathroom;Assist for transportation;Assistance with cooking/housework;Help with stairs or ramp for entrance     Functional Status Assessment   Patient has had a recent decline in their functional status and demonstrates the ability to make significant improvements in function in a reasonable and predictable amount of time.     Equipment Recommendations   Other (comment) (TBD next venue of care)      Precautions/Restrictions   Precautions Precautions: Fall Restrictions Weight Bearing Restrictions Per Provider Order: No     Mobility Bed Mobility Overal bed mobility: Needs Assistance Bed Mobility: Supine to Sit, Sit to Supine     Supine to sit: Max assist, Used rails, HOB elevated Sit to  supine: Max assist, Used rails   General bed mobility comments: Assist with all aspects of supine to sit.    Transfers Overall transfer level: Needs assistance   Transfers: Sit to/from Stand, Bed to chair/wheelchair/BSC Sit to Stand: Max assist     Step pivot transfers: Max assist     General transfer comment: Max assist for step pivot from the bed to change linens and then from the recliner.      Balance Overall balance assessment: Needs assistance Sitting-balance support: Single extremity supported, Feet unsupported Sitting balance-Leahy Scale: Poor Sitting balance - Comments: Posterior lean and LOB   Standing balance support: During functional activity, Bilateral upper extremity supported Standing balance-Leahy Scale: Zero Standing balance comment: Pt needs max assist for standing and step pivot with support from therapist                           ADL either performed or assessed with clinical judgement   ADL Overall ADL's : Needs assistance/impaired Eating/Feeding: Independent;Sitting   Grooming: Wash/dry hands;Wash/dry face;Supervision/safety   Upper Body Bathing: Minimal assistance;Sitting   Lower Body Bathing: Maximal assistance;+2 for safety/equipment;+2 for physical assistance;Sit to/from stand   Upper Body Dressing : Moderate assistance;Sitting   Lower Body Dressing: Total assistance;Sit to/from stand   Toilet Transfer: Maximal assistance;+2 for physical assistance;+2 for safety/equipment;Stand-pivot   Toileting- Clothing Manipulation and Hygiene: +2 for physical assistance;Maximal assistance;Sit to/from stand;+2 for safety/equipment       Functional mobility during ADLs: Maximal assistance;+2 for safety/equipment;+2 for physical assistance (stand pivot to  the recliner) General ADL Comments: Pt in bed with spouse present.  She reports not struggling at home with setup and not being able to get her power wheelchair into the bedroom and having to  try and use the RW for this as she has progressively gotten weaker.  Spouse endorses that he has a bad back and cannot help her much physically.  She usually needs assist for sponge bathes and dressing.  May benefit from education on AE.     Vision Baseline Vision/History: 1 Wears glasses Ability to See in Adequate Light: 0 Adequate Patient Visual Report: No change from baseline Vision Assessment?: No apparent visual deficits Additional Comments: Pt reports blurry vision secondary to not having her glasses changed.     Perception Perception: Not tested       Praxis Praxis: Not tested       Pertinent Vitals/Pain Pain Assessment Pain Assessment: Faces Faces Pain Scale: Hurts a little bit Pain Location: low back Pain Descriptors / Indicators: Aching Pain Intervention(s): Limited activity within patient's tolerance, Repositioned     Extremity/Trunk Assessment Upper Extremity Assessment Upper Extremity Assessment: LUE deficits/detail LUE Deficits / Details: History of shoulder impairment with shoulder flexion AROM approximately 0-60 degrees and AAROM 0-90 degrees with report of pain.  Limited internal and external rotation.  Elbow and hand AROM WFLs with strength at 4/5. RUE AROM WFLs with strength at 3+/5. LUE Sensation: WNL LUE Coordination: WNL   Lower Extremity Assessment Lower Extremity Assessment: Defer to PT evaluation   Cervical / Trunk Assessment Cervical / Trunk Assessment: Kyphotic   Communication Communication Communication: No apparent difficulties   Cognition Arousal: Alert Behavior During Therapy: WFL for tasks assessed/performed                                 Following commands: Intact                  Home Living Family/patient expects to be discharged to:: Private residence Living Arrangements: Spouse/significant other Available Help at Discharge: Family;Other (Comment) (Husband is limited by back pain) Type of Home: House Home  Access: Ramped entrance (Reports using ramp at back entrance vs stairs at front of home)     Home Layout: One level     Bathroom Shower/Tub: Walk-in shower;Sponge bathes at baseline   [employer]: Standard     Home Equipment: Wheelchair - Surveyor, quantity (2 wheels);Cane - single point          Prior Functioning/Environment Prior Level of Function : Needs assist             Mobility Comments: Patient reports at her baseline, she is able to ambulate short distances with 2WW. For about 1 month, patient has required assistance for STS transfers to/from power chair and BSC. Reports her power chair does not fit in bedroom so she has been attempting to walk with her husbands assistance however this is how she has fallen. Reports husband has back issues and is very limited in assistance. Reports sponge bathes with assistance of husband. Reports 2 falls in the span of 1 week.      OT Problem List: Decreased strength;Decreased knowledge of use of DME or AE;Impaired balance (sitting and/or standing);Cardiopulmonary status limiting activity;Decreased activity tolerance   OT Treatment/Interventions: Self-care/ADL training;Patient/family education;Balance training;Therapeutic activities;DME and/or AE instruction      OT Goals(Current goals can be found in the care plan section)   Acute  Rehab OT Goals Patient Stated Goal: Pt wants to get stronger where she can safely get into her bathroom at home and her bedroom OT Goal Formulation: With patient/family Time For Goal Achievement: 01/05/24 Potential to Achieve Goals: Good   OT Frequency:  Min 1X/week       AM-PAC OT 6 Clicks Daily Activity     Outcome Measure Help from another person eating meals?: None Help from another person taking care of personal grooming?: A Little Help from another person toileting, which includes using toliet, bedpan, or urinal?: Total Help from another person bathing (including washing, rinsing,  drying)?: Total Help from another person to put on and taking off regular upper body clothing?: A Lot Help from another person to put on and taking off regular lower body clothing?: Total 6 Click Score: 12   End of Session Equipment Utilized During Treatment: Oxygen Nurse Communication: Mobility status  Activity Tolerance: Patient limited by fatigue Patient left: in bed;with call bell/phone within reach;with nursing/sitter in room;with family/visitor present  OT Visit Diagnosis: Unsteadiness on feet (R26.81);Other abnormalities of gait and mobility (R26.89);Repeated falls (R29.6);Muscle weakness (generalized) (M62.81)                Time: 1600-1640 OT Time Calculation (min): 40 min Charges:  OT General Charges $OT Visit: 1 Visit OT Evaluation $OT Eval Moderate Complexity: 1 Mod OT Treatments $Self Care/Home Management : 23-37 mins  Ardena Becker, OTR/L Acute Rehabilitation Services  Office [phone] 12/22/2023

## 2023-12-22 NOTE — Plan of Care (Signed)
 Pt receiving 80mg  lasix  BID and getting good output

## 2023-12-22 NOTE — Progress Notes (Signed)
 Progress Note   Patient: Holly Spence MWN:[id number] DOB: December 13, 1945 DOA: 12/20/2023     2 DOS: the patient was seen and examined on 12/22/2023   Brief hospital course: Mrs. Lares was admitted to the hospital with the working diagnosis of heart failure exacerbation   78 yo female with the past medical history of atrial fibrillation, T2DM, COPD,CKD and heart failure who presented with dyspnea.  Reported 3 weeks of dyspnea, associated with lower extremity edema, and non productive cough. On her initial physical examination her blood pressure was 113/84, HR 63, RR 13 and 02 saturation 94% on supplemental 02 per [place] Lungs with no wheezing or rhonchi, increased work of breathing, heart with S1 and S2 present and regular with no gallops, rubs or murmurs, abdomen with no distention and positive bilateral lower extremity edema.    Na 141, K 5.4 Cl 99, bicarbonate 27 glucose 78 bun 34 cr 3,0  AST 1,744 ALT 844  BNP 1,019  High sensitive troponin 410 and 422 Lactic acid 4,9, 3,6 and 1,9   Wbc 9,2 hgb 14.1 plt 113 SARS covid 19 negative Influenza negative RSV negative   Chest radiograph with hypoinflation, positive cardiomegaly, with bilateral hilar vascular congestion with bilateral interstitial infiltrates at the lower zones with left pleural effusion. Pacemaker in place with one right atrial and one right ventricular lead.   EKG 64 bpm, left axis deviation with left bundle brunch block, qtc 523, ventricular paced rhythm with no significant ST segment or  T wave changes.   06/14 clinically improving but continue volume overloaded.   Assessment and Plan: * Acute on chronic diastolic CHF (congestive heart failure) (HCC) Echocardiogram with preserved LV systolic function with EF 60 to 65%, mild LVH, RV not well visualized, trivial pericardial effusion.   Patient continue volume overloaded.  Lactic acid is trending down.  Urine output 2,400 ml   Continue diuresis with furosemide , 80 mg IV  bid Continue blood pressure monitoring   High sensitive troponin elevation due to heart failure. No signs of acute coronary syndrome,.  Acute cardiogenic pulmonary edema, continue diuresis with furosemide   No clinical signs of pneumonia, hold on antibiotic therapy. (Pneumonia ruled out).  Her 02 saturation today is 91 % on 2 L/min   Congestive hepatopathy, continue diuresis and follow up LFT in am.  AST and ALT are trending down.  No encephalopathy or clinical signs of liver failure.   Persistent atrial fibrillation (HCC) Ventricular paced rhythm.   Continue with metoprolol   discontinue amiodarone  due to elevation in LFT  Anticoagulation with apixaban .,   Essential hypertension Continue blood pressure control with metoprolol ,  Continue diuresis with IV furosemide    Acute renal failure superimposed on stage 3b chronic kidney disease (HCC) Hyperkalemia.   Renal function with serum cr at 2,73 with K at 5.1 and serum bicarbonate at 31 Na 139  Continue diuresis with IV furosemide .   Thrombocytopenia (HCC) Cell count stable.   Type 2 diabetes mellitus with hyperlipidemia (HCC) Continue glucose cover and monitoring with insulin  sliding scale.  Patient is tolerating po well.  Fasting glucose this am 146 mg/dl   COPD (chronic obstructive pulmonary disease) (HCC) No signs of acute exacerbation, continue with bronchodilator therapy Continue 02 monitoring and supplemental 02 to keep 02 saturation 92% or greater.,  Add ICS and LABA.   Restless leg syndrome Continue with ropinirole .   Obesity, class 3 Calculated BMI is 56.1       Subjective: patient with improvement in her symptoms but continue  to have dyspnea on exertion and edema, no chest pain   Physical Exam: Vitals:   12/22/23 0033 12/22/23 0438 12/22/23 0818 12/22/23 1208  BP: (!) 126/51 115/63 (!) 107/51   Pulse: 66 63 66   Resp: 16 19 17    Temp: 98 F (36.7 C) 98.9 F (37.2 C) 98.4 F (36.9 C)   TempSrc:  Oral Oral Oral   SpO2: 92% 90% 90% 96%  Weight:  (!) 144.5 kg    Height:       Neurology awake and alert, deconditioned  ENT with mild pallor Cardiovascular with S1 and S2 present and regular with no gallops, or rubs, no murmurs No JVD Respiratory with distant breath sounds and bilateral rales, no wheezing or rhonchi  Abdomen with no distention Positive lower extremity edema +++ up to the hips  Data Reviewed:    Family Communication: I spoke with patient's family at the bedside, we talked in detail about patient's condition, plan of care and prognosis and all questions were addressed.   Disposition: Status is: Inpatient Remains inpatient appropriate because: IV diuresis   Planned Discharge Destination: Home   Author: Albertus Alt, MD 12/22/2023 4:34 PM  For on call review [email or web address].

## 2023-12-22 NOTE — Evaluation (Signed)
 Physical Therapy Evaluation Patient Details Name: Holly Spence MRN: [id number] DOB: 13-Jul-1945 Today's Date: 12/22/2023  History of Present Illness  Patient is 78 yo female admitted on 12/20/23 for acute hypoxic respiratory failure and Acute on chronic diastolic heart failure. PMH significant for T2DM, Afib, HTN, HTN, OSA, PVD, s/p pacemaker placement, GERD, COPD, and LBP.  Clinical Impression  At baseline, patient lives with her husband in one level home with ramped entrance. She ambulates short distances with 2WW, otherwise uses her power chair for mobility. Reports increased difficulty with mobility for the past month, requiring her husbands assistance for transfers and ADLs. She has been unable to ambulate and has fallen twice in the past week. Patient presents today with grossly 2+/5 strength B LE and requiring significant assistance with functional mobility. Currently requiring 3 L/min O2 via nasal cannula.  Participated in bed mobility with maxAx1 and and seated lateral scooting with mod-maxA. Tolerated ~10 minutes with CGA at EOB. Patient declined attempts to stand secondary to fatigue and fear of falling. Returned to supine and set up with her meal tray. Patient would continue to benefit from skilled acute PT services in order to address the above impairments. Recommend post-acute rehab <3 hours/day.      If plan is discharge home, recommend the following: Two people to help with walking and/or transfers;A lot of help with bathing/dressing/bathroom;Assistance with cooking/housework;Assist for transportation   Can travel by private vehicle   No    Equipment Recommendations Other (comment) (to be determined by next level of care)  Recommendations for Other Services       Functional Status Assessment Patient has had a recent decline in their functional status and demonstrates the ability to make significant improvements in function in a reasonable and predictable amount of time.      Precautions / Restrictions Precautions Precautions: Fall Restrictions Weight Bearing Restrictions Per Provider Order: No      Mobility  Bed Mobility Overal bed mobility: Needs Assistance Bed Mobility: Supine to Sit, Sit to Supine, Rolling Rolling: Max assist   Supine to sit: Max assist, Used rails, HOB elevated Sit to supine: Max assist, Used rails   General bed mobility comments: cues for assistance with bed features and railing, requiring mod cues    Transfers Overall transfer level: Needs assistance                 General transfer comment: Patient declined attempts for standing transfer secondary to fatigue and fear of falling.       Balance Overall balance assessment: Needs assistance Sitting-balance support: Single extremity supported, Feet unsupported Sitting balance-Leahy Scale: Poor Sitting balance - Comments: Initially requiring minA for stability but progression to CGA. Tolerated seated EOB for 10 minutes. Postural control: Left lateral lean (due to body habitus)                                   Pertinent Vitals/Pain Pain Assessment Pain Assessment: 0-10 Pain Score: 5  Pain Location: low back Pain Descriptors / Indicators: Aching    Home Living Family/patient expects to be discharged to:: Private residence Living Arrangements: Spouse/significant other Available Help at Discharge: Family;Other (Comment) (Husband is limited by back pain) Type of Home: House Home Access: Ramped entrance (Reports using ramp at back entrance vs stairs at front of home)       Home Layout: One level Home Equipment: Wheelchair - Surveyor, quantity (2 wheels);Cane -  single point      Prior Function Prior Level of Function : Needs assist             Mobility Comments: Patient reports at her baseline, she is able to ambulate short distances with 2WW. For about 1 month, patient has required assistance for STS transfers to/from power chair and BSC.  Reports her power chair does not fit in bedroom so she has been attempting to walk with her husbands assistance however this is how she has fallen. Reports husband has back issues and is very limited in assistance. Reports sponge bathes with assistance of husband. Reports 2 falls in the span of 1 week.       Extremity/Trunk Assessment        Lower Extremity Assessment Lower Extremity Assessment: RLE deficits/detail;LLE deficits/detail RLE Deficits / Details: grossly 2+/5 R LE RLE Sensation: decreased light touch LLE Deficits / Details: grossly 2+/5 L LE LLE Sensation: decreased light touch    Cervical / Trunk Assessment Cervical / Trunk Assessment: Kyphotic  Communication   Communication Communication: No apparent difficulties    Cognition Arousal: Alert Behavior During Therapy: WFL for tasks assessed/performed   PT - Cognitive impairments: No apparent impairments                         Following commands: Intact       Cueing Cueing Techniques: Verbal cues, Tactile cues     General Comments General comments (skin integrity, edema, etc.): Patient declined STS transfer attempt, required mod/maxA for laterally scooting toward HOB.    Exercises     Assessment/Plan    PT Assessment Patient needs continued PT services  PT Problem List Decreased strength;Decreased balance;Pain;Decreased mobility;Decreased knowledge of use of DME;Cardiopulmonary status limiting activity;Decreased activity tolerance;Decreased safety awareness;Impaired sensation       PT Treatment Interventions DME instruction;Gait training;Functional mobility training;Therapeutic activities;Therapeutic exercise;Balance training;Neuromuscular re-education;Patient/family education    PT Goals (Current goals can be found in the Care Plan section)  Acute Rehab PT Goals Patient Stated Goal: Patient wants to be able to walk with her walker household distances. PT Goal Formulation: With patient Time  For Goal Achievement: 01/05/24 Potential to Achieve Goals: Good    Frequency Min 2X/week        AM-PAC PT 6 Clicks Mobility  Outcome Measure Help needed turning from your back to your side while in a flat bed without using bedrails?: A Lot Help needed moving from lying on your back to sitting on the side of a flat bed without using bedrails?: A Lot Help needed moving to and from a bed to a chair (including a wheelchair)?: Total Help needed standing up from a chair using your arms (e.g., wheelchair or bedside chair)?: Total Help needed to walk in hospital room?: Total Help needed climbing 3-5 steps with a railing? : Total 6 Click Score: 8    End of Session Equipment Utilized During Treatment: Oxygen Activity Tolerance: Patient tolerated treatment well Patient left: in bed;with bed alarm set;with call bell/phone within reach;Other (comment) (set up with meal tray) Nurse Communication: Mobility status PT Visit Diagnosis: History of falling (Z91.81);Unsteadiness on feet (R26.81);Other abnormalities of gait and mobility (R26.89)    Time: 1610-9604 PT Time Calculation (min) (ACUTE ONLY): 46 min   Charges:   PT Evaluation $PT Eval Low Complexity: 1 Low PT Treatments $Therapeutic Activity: 23-37 mins PT General Charges $$ ACUTE PT VISIT: 1 Visit  Mariano Shiver, PT, DPT [facility] Acute Rehabilitation Office: [phone]   Davey Erp 12/22/2023, 12:31 PM

## 2023-12-22 NOTE — Progress Notes (Signed)
 Rounding Note   Patient Name: MANIYAH MOLLER Date of Encounter: 12/22/2023  [facility] Cardiologist: Nelia Balzarine, MD   Subjective Net -1.9 L yesterday.  Creatinine improving (2.80 > 2.73).  Improvement in transaminitis.  She reports he continues to feel short of breath, denies any chest pain  Scheduled Meds:  amiodarone   200 mg Oral Daily   apixaban   2.5 mg Oral BID   ferrous sulfate  325 mg Oral QHS   furosemide   80 mg Intravenous Q12H   gabapentin   600 mg Oral QHS   insulin  aspart  0-6 Units Subcutaneous Q4H   metoprolol  succinate  25 mg Oral Daily   rOPINIRole   4 mg Oral q AM   sodium chloride  flush  3 mL Intravenous Q12H   Continuous Infusions:  PRN Meds: acetaminophen  **OR** acetaminophen , ipratropium-albuterol , oxyCODONE , trimethobenzamide    Vital Signs  Vitals:   12/21/23 1945 12/22/23 0033 12/22/23 0438 12/22/23 0818  BP: (!) 127/51 (!) 126/51 115/63 (!) 107/51  Pulse: 69 66 63 66  Resp: (!) 23 16 19 17   Temp: 98.2 F (36.8 C) 98 F (36.7 C) 98.9 F (37.2 C) 98.4 F (36.9 C)  TempSrc: Oral Oral Oral Oral  SpO2: 92% 92% 90% 90%  Weight:   (!) 144.5 kg   Height:        Intake/Output Summary (Last 24 hours) at 12/22/2023 1015 Last data filed at 12/22/2023 0700 Gross per 24 hour  Intake 480 ml  Output 2400 ml  Net -1920 ml      12/22/2023    4:38 AM 12/21/2023    5:00 AM 12/20/2023   10:42 PM  Last 3 Weights  Weight (lbs) 318 lb 9 oz 316 lb 12.8 oz 316 lb 12.8 oz  Weight (kg) 144.5 kg 143.7 kg 143.7 kg      Telemetry V paced- Personally Reviewed  ECG  No new ECG- Personally Reviewed  Physical Exam  GEN: No acute distress.   Neck: Difficult to assess given habitus Cardiac: RRR, no murmurs, rubs, or gallops.  Respiratory: Diffuse rhonchi GI: Soft, nontender, non-distended  MS: 1+  edema Neuro:  Nonfocal  Psych: Normal affect   Labs High Sensitivity Troponin:   Recent Labs  Lab 12/20/23 1815 12/20/23 2016 12/21/23 1007   TROPONINIHS 410* 425* 442*     Chemistry Recent Labs  Lab 12/20/23 1815 12/20/23 1849 12/21/23 0241 12/22/23 0251  NA 141 137  137 139 139  K 5.4* 5.3*  5.3* 5.2* 5.1  CL 99 100 99 98  CO2 27  --  30 31  GLUCOSE 78 74 157* 146*  BUN 34* 45* 34* 45*  CREATININE 3.01* 2.90* 2.88* 2.73*  CALCIUM  8.3*  --  8.2* 8.0*  MG  --   --  2.5*  --   PROT 5.2*  --  5.1* 5.2*  ALBUMIN 2.6*  --  2.5* 2.7*  AST 1,744*  --  1,449* 513*  ALT 844*  --  834* 661*  ALKPHOS 106  --  84 91  BILITOT 2.3*  --  2.1* 1.0  GFRNONAA 15*  --  16* 17*  ANIONGAP 15  --  10 10    Lipids No results for input(s): CHOL, TRIG, HDL, LABVLDL, LDLCALC, CHOLHDL in the last 168 hours.  Hematology Recent Labs  Lab 12/20/23 1815 12/20/23 1849 12/21/23 0241 12/22/23 0251  WBC 9.2  --  6.3 5.8  RBC 5.31*  --  5.03 4.91  HGB 14.1 17.3*  17.0*  13.6 13.0  HCT 50.6* 51.0*  50.0* 46.8* 44.8  MCV 95.3  --  93.0 91.2  MCH 26.6  --  27.0 26.5  MCHC 27.9*  --  29.1* 29.0*  RDW 20.6*  --  20.2* 20.5*  PLT 113*  --  105* 115*   Thyroid  No results for input(s): TSH, FREET4 in the last 168 hours.  BNP Recent Labs  Lab 12/20/23 1821  BNP 1,019.0*    DDimer No results for input(s): DDIMER in the last 168 hours.   Radiology  ECHOCARDIOGRAM COMPLETE Result Date: 12/21/2023    ECHOCARDIOGRAM REPORT   Patient Name:   LILAH MIJANGOS Baze Date of Exam: 12/21/2023 Medical Rec #:  [id number]      Height:       63.0 in Accession #:    [id number]     Weight:       316.8 lb Date of Birth:  07-Feb-1946      BSA:          2.351 m Patient Age:    78 years       BP:           117/54 mmHg Patient Gender: F              HR:           71 bpm. Exam Location:  Inpatient Procedure: 2D Echo, Cardiac Doppler and Color Doppler (Both Spectral and Color            Flow Doppler were utilized during procedure). Indications:    CHF-Acute Diastolic I50.31  History:        Patient has prior history of Echocardiogram examinations, most                  recent 12/29/2022. Risk Factors:Hypertension and Diabetes.  Sonographer:    Astrid Blamer Referring Phys: [id number] TIMOTHY S OPYD  Sonographer Comments: No apical window and suboptimal parasternal window. Image acquisition challenging due to patient body habitus. IMPRESSIONS  1. Technically difficult study with very limited views. Appears grossly normal LV systolic function, but limited evaluation. Left ventricular ejection fraction, by estimation, is 60 to 65%. The left ventricle has normal function. Left ventricular endocardial border not optimally defined to evaluate regional wall motion. There is mild left ventricular hypertrophy.  2. Right ventricular systolic function was not well visualized. The right ventricular size is not well visualized.  3. The mitral valve is degenerative. Trivial mitral valve regurgitation. Moderate mitral annular calcification.  4. The aortic valve was not well visualized. Aortic valve regurgitation is not visualized. Unable to measure aortic valve gradients to assess for aortic stenosis. FINDINGS  Left Ventricle: Left ventricular ejection fraction, by estimation, is 60 to 65%. The left ventricle has normal function. Left ventricular endocardial border not optimally defined to evaluate regional wall motion. The left ventricular internal cavity size was normal in size. There is mild left ventricular hypertrophy. Left ventricular diastolic parameters are indeterminate. Right Ventricle: The right ventricular size is not well visualized. Right vetricular wall thickness was not well visualized. Right ventricular systolic function was not well visualized. Left Atrium: Left atrial size was not well visualized. Right Atrium: Right atrial size was not well visualized. Pericardium: Trivial pericardial effusion is present. Mitral Valve: The mitral valve is degenerative in appearance. Moderate mitral annular calcification. Trivial mitral valve regurgitation. Tricuspid Valve: The  tricuspid valve is normal in structure. Tricuspid valve regurgitation is trivial. Aortic Valve: The aortic valve was not well visualized. Aortic  valve regurgitation is not visualized. Pulmonic Valve: The pulmonic valve was not well visualized. Pulmonic valve regurgitation is not visualized. Aorta: The ascending aorta was not well visualized. IAS/Shunts: The interatrial septum was not well visualized.  LEFT VENTRICLE PLAX 2D LVIDd:         4.90 cm LVIDs:         2.80 cm LV PW:         1.20 cm LV IVS:        1.00 cm LVOT diam:     1.90 cm LVOT Area:     2.84 cm   SHUNTS Systemic Diam: 1.90 cm Carson Clara MD Electronically signed by Carson Clara MD Signature Date/Time: 12/21/2023/9:53:08 AM    Final    US  Abdomen Limited RUQ (LIVER/GB) Result Date: 12/21/2023 CLINICAL DATA:  Elevated liver enzymes EXAM: ULTRASOUND ABDOMEN LIMITED RIGHT UPPER QUADRANT COMPARISON:  CT without contrast 09/16/2019 FINDINGS: Gallbladder: Status post cholecystectomy. Common bile duct: Diameter: 6 mm, normal for the post cholecystectomy state. No intrahepatic bile duct dilatation is seen. Liver: No focal lesion identified. There is loss of fine detail due to the patient's size. Grossly within normal limits in parenchymal echogenicity. Portal vein is patent on color Doppler imaging with normal direction of blood flow towards the liver. Other: None. IMPRESSION: 1. Status post cholecystectomy. No biliary dilatation. 2. No focal liver lesion identified.  Limited exam due to habitus. Electronically Signed   By: Denman Fischer M.D.   On: 12/21/2023 05:34   DG Chest Port 1 View Result Date: 12/20/2023 CLINICAL DATA:  Shortness of breath. EXAM: PORTABLE CHEST 1 VIEW COMPARISON:  02/21/2023. FINDINGS: Cardiomegaly. Stable left chest wall pacemaker. Moderate-sized left pleural effusion with associated left basilar consolidation/atelectasis. Bilateral interstitial prominence is favored to reflect interstitial pulmonary edema. No  pneumothorax. No acute osseous abnormality. IMPRESSION: 1. Moderate-sized left pleural effusion with associated left basilar consolidation/atelectasis. 2. Cardiomegaly with interstitial pulmonary edema. Electronically Signed   By: Mannie Seek M.D.   On: 12/20/2023 19:39    Cardiac Studies   Patient Profile   78 y.o. female with history of atrial fibrillation, T2DM, COPD, CKD, diastolic heart failure who presents with acute hypoxic respiratory failure  Assessment & Plan  Acute on chronic diastolic heart failure: Echocardiogram with very limited views but grossly normal LV systolic function.  Difficult to assess volume status given body habitus but appears hypervolemic -Continue IV lasix  - Strict I's/O's and daily weights  Elevated troponin: 410 > 425 > 442.  Flat troponin, not consistent with acute coronary syndrome.  Suspect demand ischemia in setting of decompensated heart failure.  Persistent Afib: Currently in atrial sensed, Vpaced rhythm.  Continue Eliquis , metoprolol .  Will discontinue amiodarone  given transaminitis  AKI on CKD: Creatinine 3.0 on admission, will monitor closely with diuresis.  Improving, creatinine 2.7 today  Morbid obesity: Body mass index is 56.43 kg/m.  Transaminitis: Unclear cause, could be congestive hepatopathy, improving with diuresis.  Will discontinue amiodarone  given transaminitis   For questions or updates, please contact [facility] Please consult [email or web address] for contact info under     Signed, Wendie Hamburg, MD  12/22/2023, 10:15 AM

## 2023-12-23 DIAGNOSIS — I10 Essential (primary) hypertension: Secondary | ICD-10-CM | POA: Diagnosis not present

## 2023-12-23 DIAGNOSIS — I5033 Acute on chronic diastolic (congestive) heart failure: Secondary | ICD-10-CM | POA: Diagnosis not present

## 2023-12-23 DIAGNOSIS — I4819 Other persistent atrial fibrillation: Secondary | ICD-10-CM | POA: Diagnosis not present

## 2023-12-23 DIAGNOSIS — N179 Acute kidney failure, unspecified: Secondary | ICD-10-CM | POA: Diagnosis not present

## 2023-12-23 DIAGNOSIS — R7401 Elevation of levels of liver transaminase levels: Secondary | ICD-10-CM | POA: Diagnosis not present

## 2023-12-23 LAB — COMPREHENSIVE METABOLIC PANEL WITH GFR
ALT: 439 U/L — ABNORMAL HIGH (ref 0–44)
AST: 145 U/L — ABNORMAL HIGH (ref 15–41)
Albumin: 2.6 g/dL — ABNORMAL LOW (ref 3.5–5.0)
Alkaline Phosphatase: 78 U/L (ref 38–126)
Anion gap: 9 (ref 5–15)
BUN: 50 mg/dL — ABNORMAL HIGH (ref 8–23)
CO2: 33 mmol/L — ABNORMAL HIGH (ref 22–32)
Calcium: 7.9 mg/dL — ABNORMAL LOW (ref 8.9–10.3)
Chloride: 96 mmol/L — ABNORMAL LOW (ref 98–111)
Creatinine, Ser: 2.65 mg/dL — ABNORMAL HIGH (ref 0.44–1.00)
GFR, Estimated: 18 mL/min — ABNORMAL LOW (ref >60)
Glucose, Bld: 146 mg/dL — ABNORMAL HIGH (ref 70–99)
Potassium: 4.6 mmol/L (ref 3.5–5.1)
Sodium: 138 mmol/L (ref 135–145)
Total Bilirubin: 1 mg/dL (ref 0.0–1.2)
Total Protein: 5 g/dL — ABNORMAL LOW (ref 6.5–8.1)

## 2023-12-23 LAB — CBC
HCT: 41.9 % (ref 36.0–46.0)
Hemoglobin: 12.1 g/dL (ref 12.0–15.0)
MCH: 26.7 pg (ref 26.0–34.0)
MCHC: 28.9 g/dL — ABNORMAL LOW (ref 30.0–36.0)
MCV: 92.5 fL (ref 80.0–100.0)
Platelets: 79 10*3/uL — ABNORMAL LOW (ref 150–400)
RBC: 4.53 MIL/uL (ref 3.87–5.11)
RDW: 20.3 % — ABNORMAL HIGH (ref 11.5–15.5)
WBC: 5.5 10*3/uL (ref 4.0–10.5)
nRBC: 0 % (ref 0.0–0.2)

## 2023-12-23 LAB — CULTURE, BLOOD (ROUTINE X 2)

## 2023-12-23 LAB — GLUCOSE, CAPILLARY
Glucose-Capillary: 131 mg/dL — ABNORMAL HIGH (ref 70–99)
Glucose-Capillary: 255 mg/dL — ABNORMAL HIGH (ref 70–99)
Glucose-Capillary: 170 mg/dL — ABNORMAL HIGH (ref 70–99)
Glucose-Capillary: 197 mg/dL — ABNORMAL HIGH (ref 70–99)
Glucose-Capillary: 181 mg/dL — ABNORMAL HIGH (ref 70–99)

## 2023-12-23 LAB — MAGNESIUM: Magnesium: 2.3 mg/dL (ref 1.7–2.4)

## 2023-12-23 MED ORDER — IPRATROPIUM-ALBUTEROL 0.5-2.5 (3) MG/3ML IN SOLN
3.0000 mL | Freq: Three times a day (TID) | RESPIRATORY_TRACT | Status: DC
  Administered 2023-12-23 – 2023-12-24 (×3): 3 mL via RESPIRATORY_TRACT
  Filled 2023-12-23 (×4): qty 3

## 2023-12-23 MED ORDER — BENZONATATE 100 MG PO CAPS
100.0000 mg | ORAL_CAPSULE | Freq: Three times a day (TID) | ORAL | Status: DC
  Administered 2023-12-23 – 2023-12-29 (×18): 100 mg via ORAL
  Filled 2023-12-23 (×18): qty 1

## 2023-12-23 MED ORDER — METOLAZONE 2.5 MG PO TABS
2.5000 mg | ORAL_TABLET | Freq: Once | ORAL | Status: AC
  Administered 2023-12-23: 2.5 mg via ORAL
  Filled 2023-12-23: qty 1

## 2023-12-23 NOTE — Progress Notes (Signed)
 Rounding Note   Patient Name: Holly Spence Date of Encounter: 12/23/2023  [facility] Cardiologist: Nelia Balzarine, MD   Subjective Net -1.2 L yesterday but also with unmeasured UOP.  Creatinine improving (3.0 >2.80 > 2.73 > 2.65).  Significant improvement in liver enzymes.  She reports dyspnea is improving but remains short of breath   Scheduled Meds:  apixaban   2.5 mg Oral BID   ferrous sulfate  325 mg Oral QHS   fluticasone  furoate-vilanterol  1 puff Inhalation Daily   furosemide   80 mg Intravenous Q12H   gabapentin   600 mg Oral QHS   Gerhardt's butt cream   Topical BID   insulin  aspart  0-6 Units Subcutaneous Q4H   ipratropium-albuterol   3 mL Nebulization TID   metoprolol  succinate  25 mg Oral Daily   nystatin   Topical BID   rOPINIRole   4 mg Oral q AM   sodium chloride  flush  3 mL Intravenous Q12H   Continuous Infusions:  PRN Meds: acetaminophen  **OR** acetaminophen , albuterol , mouth rinse, oxyCODONE , trimethobenzamide    Vital Signs  Vitals:   12/22/23 2011 12/23/23 0442 12/23/23 0729 12/23/23 0814  BP: (!) 101/48 (!) 104/52  (!) 108/56  Pulse: 62 61 71   Resp: 18 18    Temp: 98 F (36.7 C) 98 F (36.7 C)    TempSrc: Oral Oral    SpO2: 96% 92%  92%  Weight:  (!) 140.6 kg    Height:        Intake/Output Summary (Last 24 hours) at 12/23/2023 0850 Last data filed at 12/23/2023 0820 Gross per 24 hour  Intake 360 ml  Output 2000 ml  Net -1640 ml      12/23/2023    4:42 AM 12/22/2023    4:38 AM 12/21/2023    5:00 AM  Last 3 Weights  Weight (lbs) 309 lb 15.5 oz 318 lb 9 oz 316 lb 12.8 oz  Weight (kg) 140.6 kg 144.5 kg 143.7 kg      Telemetry V paced- Personally Reviewed  ECG  No new ECG- Personally Reviewed  Physical Exam  GEN: No acute distress.   Neck: Difficult to assess given habitus Cardiac: RRR, no murmurs, rubs, or gallops.  Respiratory: Scattered rhonchi GI: Soft, nontender, non-distended  MS: 1+  edema Neuro:  Nonfocal   Psych: Normal affect   Labs High Sensitivity Troponin:   Recent Labs  Lab 12/20/23 1815 12/20/23 2016 12/21/23 1007  TROPONINIHS 410* 425* 442*     Chemistry Recent Labs  Lab 12/21/23 0241 12/22/23 0251 12/23/23 0313  NA 139 139 138  K 5.2* 5.1 4.6  CL 99 98 96*  CO2 30 31 33*  GLUCOSE 157* 146* 146*  BUN 34* 45* 50*  CREATININE 2.88* 2.73* 2.65*  CALCIUM  8.2* 8.0* 7.9*  MG 2.5*  --  2.3  PROT 5.1* 5.2* 5.0*  ALBUMIN 2.5* 2.7* 2.6*  AST 1,449* 513* 145*  ALT 834* 661* 439*  ALKPHOS 84 91 78  BILITOT 2.1* 1.0 1.0  GFRNONAA 16* 17* 18*  ANIONGAP 10 10 9     Lipids No results for input(s): CHOL, TRIG, HDL, LABVLDL, LDLCALC, CHOLHDL in the last 168 hours.  Hematology Recent Labs  Lab 12/21/23 0241 12/22/23 0251 12/23/23 0313  WBC 6.3 5.8 5.5  RBC 5.03 4.91 4.53  HGB 13.6 13.0 12.1  HCT 46.8* 44.8 41.9  MCV 93.0 91.2 92.5  MCH 27.0 26.5 26.7  MCHC 29.1* 29.0* 28.9*  RDW 20.2* 20.5* 20.3*  PLT  105* 115* 79*   Thyroid  No results for input(s): TSH, FREET4 in the last 168 hours.  BNP Recent Labs  Lab 12/20/23 1821  BNP 1,019.0*    DDimer No results for input(s): DDIMER in the last 168 hours.   Radiology  No results found.   Cardiac Studies   Patient Profile   78 y.o. female with history of atrial fibrillation, T2DM, COPD, CKD, diastolic heart failure who presents with acute hypoxic respiratory failure  Assessment & Plan  Acute on chronic diastolic heart failure: Echocardiogram with very limited views but grossly normal LV systolic function.  Difficult to assess volume status given body habitus but appears hypervolemic -Continue IV lasix  -Strict I's/O's and daily weights  Elevated troponin: 410 > 425 > 442.  Flat troponin, not consistent with acute coronary syndrome.  Suspect demand ischemia in setting of decompensated heart failure.  Persistent Afib: Currently in atrial sensed, Vpaced rhythm.  Continue Eliquis , metoprolol .   Holding amiodarone  given transaminitis  AKI on CKD: Creatinine 3.0 on admission, will monitor closely with diuresis.  Improving, creatinine 2.65 today  Morbid obesity: Body mass index is 54.91 kg/m.  Transaminitis: Suspect congestive hepatopathy, improving with diuresis.  Amiodarone  held given transaminitis   For questions or updates, please contact [facility] Please consult [email or web address] for contact info under     Signed, Wendie Hamburg, MD  12/23/2023, 8:50 AM

## 2023-12-23 NOTE — Progress Notes (Addendum)
 Progress Note   Patient: Holly Spence WGN:[id number] DOB: 10-Mar-1946 DOA: 12/20/2023     3 DOS: the patient was seen and examined on 12/23/2023   Brief hospital course: Holly Spence was admitted to the hospital with the working diagnosis of heart failure exacerbation   78 yo female with the past medical history of atrial fibrillation, T2DM, COPD,CKD and heart failure who presented with dyspnea.  Reported 3 weeks of dyspnea, associated with lower extremity edema, and non productive cough. On her initial physical examination her blood pressure was 113/84, HR 63, RR 13 and 02 saturation 94% on supplemental 02 per [name] with no wheezing or rhonchi, increased work of breathing, heart with S1 and S2 present and regular with no gallops, rubs or murmurs, abdomen with no distention and positive bilateral lower extremity edema.    Na 141, K 5.4 Cl 99, bicarbonate 27 glucose 78 bun 34 cr 3,0  AST 1,744 ALT 844  BNP 1,019  High sensitive troponin 410 and 422 Lactic acid 4,9, 3,6 and 1,9   Wbc 9,2 hgb 14.1 plt 113 SARS covid 19 negative Influenza negative RSV negative   Chest radiograph with hypoinflation, positive cardiomegaly, with bilateral hilar vascular congestion with bilateral interstitial infiltrates at the lower zones with left pleural effusion. Pacemaker in place with one right atrial and one right ventricular lead.   EKG 64 bpm, left axis deviation with left bundle brunch block, qtc 523, ventricular paced rhythm with no significant ST segment or  T wave changes.   06/14 clinically improving but continue volume overloaded.  06/15 continue diuresis   Assessment and Plan: * Acute on chronic diastolic CHF (congestive heart failure) (HCC) Echocardiogram with preserved LV systolic function with EF 60 to 65%, mild LVH, RV not well visualized, trivial pericardial effusion.   Patient continue volume overloaded.  Lactic acid is trending down.  Urine output 1,600 ml   Continue diuresis  with furosemide , 80 mg IV bid Add one dose of metolazone to augment diuresis  Continue with metoprolol .  Systolic blood pressure 100 mmHg range   High sensitive troponin elevation due to heart failure. No signs of acute coronary syndrome,.  Acute cardiogenic pulmonary edema, continue diuresis with furosemide   No clinical signs of pneumonia, hold on antibiotic therapy. (Pneumonia ruled out).  Her 02 saturation today is 92 % on 2 L/min   Congestive hepatopathy, continue diuresis  AST and ALT are trending down.  No encephalopathy or clinical signs of liver failure.   Persistent atrial fibrillation (HCC) Ventricular paced rhythm.   Continue with metoprolol   discontinue amiodarone  due to elevation in LFT  Anticoagulation with apixaban .,   Essential hypertension Continue blood pressure control with metoprolol ,  Continue diuresis with IV furosemide    Acute renal failure superimposed on stage 3b chronic kidney disease (HCC) Hyperkalemia.   Renal function with serum cr at 2,65 with K at 4,6 and serum bicarbonate at 33  Na 138   Continue loop diuretic and one dose of thiazide today.  Follow up renal function and electrolytes tomorrow  Avoid hypotension and nephrotoxic medications   Thrombocytopenia (HCC) Cell count stable.   Type 2 diabetes mellitus with hyperlipidemia (HCC) Continue glucose cover and monitoring with insulin  sliding scale.  Patient is tolerating po well.  Fasting glucose this am 146 mg/dl   COPD (chronic obstructive pulmonary disease) (HCC) No signs of acute exacerbation, continue with bronchodilator therapy Continue 02 monitoring and supplemental 02 to keep 02 saturation 92% or greater.,  On ICS and  LABA.   Restless leg syndrome Continue with ropinirole .   Obesity, class 3 Calculated BMI is 56.1       Subjective: Patient with improvement in dyspnea and edema but not back to baseline, no chest pain   Physical Exam: Vitals:   12/23/23 0729 12/23/23  0814 12/23/23 1213 12/23/23 1500  BP:  (!) 108/56 132/63 (!) (P) 96/48  Pulse: 71  68   Resp:   18   Temp:    (!) (P) 95 F (35 C)  TempSrc:    (P) Oral  SpO2:  92% 94%   Weight:      Height:       Neurology awake and alert ENT With mild pallor with no icterus Cardiovascular with S1 and S2 present and regular with no gallops, or rubs, no murmurs No JVD Respiratory with mild rales bilaterally with scattered rhonchi and wheezing Abdomen with protuberant, non tender and not distended Positive lower extremity edema pitting +++ up to the thighs and lower abdomen  No rashes.  Discoloration distal legs  Erythema at skin folds.   Data Reviewed:    Family Communication: I spoke with patient's family at the bedside,  Disposition: Status is: Inpatient Remains inpatient appropriate because: IV diuresis   Planned Discharge Destination: Home    Author: Albertus Alt, MD 12/23/2023 4:39 PM  For on call review [email or web address].

## 2023-12-24 ENCOUNTER — Other Ambulatory Visit (HOSPITAL_COMMUNITY): Payer: Self-pay

## 2023-12-24 ENCOUNTER — Telehealth (HOSPITAL_COMMUNITY): Payer: Self-pay | Admitting: Pharmacy Technician

## 2023-12-24 ENCOUNTER — Telehealth: Payer: Self-pay

## 2023-12-24 DIAGNOSIS — R7401 Elevation of levels of liver transaminase levels: Secondary | ICD-10-CM | POA: Diagnosis not present

## 2023-12-24 DIAGNOSIS — I5033 Acute on chronic diastolic (congestive) heart failure: Secondary | ICD-10-CM | POA: Diagnosis not present

## 2023-12-24 DIAGNOSIS — N1832 Chronic kidney disease, stage 3b: Secondary | ICD-10-CM | POA: Diagnosis not present

## 2023-12-24 DIAGNOSIS — I10 Essential (primary) hypertension: Secondary | ICD-10-CM | POA: Diagnosis not present

## 2023-12-24 DIAGNOSIS — N179 Acute kidney failure, unspecified: Secondary | ICD-10-CM | POA: Diagnosis not present

## 2023-12-24 DIAGNOSIS — I4819 Other persistent atrial fibrillation: Secondary | ICD-10-CM | POA: Diagnosis not present

## 2023-12-24 LAB — MAGNESIUM: Magnesium: 2.2 mg/dL (ref 1.7–2.4)

## 2023-12-24 LAB — COMPREHENSIVE METABOLIC PANEL WITH GFR
ALT: 313 U/L — ABNORMAL HIGH (ref 0–44)
AST: 67 U/L — ABNORMAL HIGH (ref 15–41)
Albumin: 3 g/dL — ABNORMAL LOW (ref 3.5–5.0)
Alkaline Phosphatase: 83 U/L (ref 38–126)
Anion gap: 7 (ref 5–15)
BUN: 52 mg/dL — ABNORMAL HIGH (ref 8–23)
CO2: 37 mmol/L — ABNORMAL HIGH (ref 22–32)
Calcium: 8.2 mg/dL — ABNORMAL LOW (ref 8.9–10.3)
Chloride: 94 mmol/L — ABNORMAL LOW (ref 98–111)
Creatinine, Ser: 2.36 mg/dL — ABNORMAL HIGH (ref 0.44–1.00)
GFR, Estimated: 20 mL/min — ABNORMAL LOW (ref >60)
Glucose, Bld: 125 mg/dL — ABNORMAL HIGH (ref 70–99)
Potassium: 4.3 mmol/L (ref 3.5–5.1)
Sodium: 138 mmol/L (ref 135–145)
Total Bilirubin: 1.2 mg/dL (ref 0.0–1.2)
Total Protein: 5.5 g/dL — ABNORMAL LOW (ref 6.5–8.1)

## 2023-12-24 LAB — GLUCOSE, CAPILLARY
Glucose-Capillary: 116 mg/dL — ABNORMAL HIGH (ref 70–99)
Glucose-Capillary: 132 mg/dL — ABNORMAL HIGH (ref 70–99)
Glucose-Capillary: 140 mg/dL — ABNORMAL HIGH (ref 70–99)
Glucose-Capillary: 167 mg/dL — ABNORMAL HIGH (ref 70–99)
Glucose-Capillary: 170 mg/dL — ABNORMAL HIGH (ref 70–99)
Glucose-Capillary: 195 mg/dL — ABNORMAL HIGH (ref 70–99)
Glucose-Capillary: 266 mg/dL — ABNORMAL HIGH (ref 70–99)

## 2023-12-24 LAB — CULTURE, BLOOD (ROUTINE X 2)
Culture  Setup Time: NO GROWTH
Special Requests: ADEQUATE

## 2023-12-24 MED ORDER — WARFARIN SODIUM 5 MG PO TABS
5.0000 mg | ORAL_TABLET | Freq: Once | ORAL | Status: AC
  Administered 2023-12-24: 5 mg via ORAL
  Filled 2023-12-24: qty 1

## 2023-12-24 MED ORDER — WARFARIN - PHARMACIST DOSING INPATIENT: Freq: Every day | Status: DC

## 2023-12-24 NOTE — Consult Note (Signed)
 WOC Nurse Consult Note: Reason for Consult: DTPI buttock Patient presenting with SOB, history of COPD Wound type: Deep Tissue Pressure Injury; right upper buttock  Pressure Injury POA: No Measurement: see nursing flow sheets Wound bed: dark purple, non blanchable tissue Drainage (amount, consistency, odor) see nursing flow sheets Periwound: small amount of skin peeling noted at right proximal edge of injury Dressing procedure/placement/frequency: Cleanse wound with saline, pat dry Cover with single layer of xeroform, top with foam Change daily Turn and reposition patient per hospital policy  WOC Nurse team will follow with you and see patient within 10 days for wound assessments.  Please notify WOC nurses of any acute changes in the wounds or any new areas of concern Rossy Virag [facility] MSN, RN,CWOCN, CNS, CWON-AP [phone]

## 2023-12-24 NOTE — Progress Notes (Signed)
 Physical Therapy Treatment Patient Details Name: Holly Spence MRN: [id number] DOB: 23-Aug-1945 Today's Date: 12/24/2023   History of Present Illness Patient is 78 yo female admitted on 12/20/23 for acute hypoxic respiratory failure and acute on chronic diastolic heart failure. PMH significant for T2DM, Afib, HTN, HTN, OSA, PVD, s/p pacemaker placement, GERD, COPD, and LBP.    PT Comments  Pt progressing to gait trials this date, ambulating 2x8 ft with use of RW, min-mod physical assist, and close chair follow. Pt anxious about falling, responds well to encouragement. Pt with DOE 2/4, and got dizzy after first bout of gait on RA trial. SpO2 drop to 83%, so next gait bout pt used 2LO2 with SpO2 maintained 93% and greater. Pt progressing well.      If plan is discharge home, recommend the following: A lot of help with bathing/dressing/bathroom;Assistance with cooking/housework;Assist for transportation;A lot of help with walking and/or transfers   Can travel by private vehicle        Equipment Recommendations  None recommended by PT    Recommendations for Other Services       Precautions / Restrictions Precautions Precautions: Fall Restrictions Weight Bearing Restrictions Per Provider Order: No     Mobility  Bed Mobility Overal bed mobility: Needs Assistance             General bed mobility comments: up in chair, left up in chair    Transfers Overall transfer level: Needs assistance Equipment used: Rollator (4 wheels) Transfers: Sit to/from Stand Sit to Stand: Mod assist           General transfer comment: assist for power up, rise, steadying especially when transferring hands recliner<>rollator    Ambulation/Gait Ambulation/Gait assistance: Min assist, +2 safety/equipment Gait Distance (Feet): 8 Feet (x2) Assistive device: Rollator (4 wheels) Gait Pattern/deviations: Step-through pattern, Decreased stride length, Trunk flexed, Shuffle Gait velocity: decr      General Gait Details: assist to steady, cues for upright posture, pursed lip breathing. dizziness with exertion, suspect at least partly due to work of breathing. trialled RA, SpO2 drop to 83%. Placed on 2LO2 for second trial, SpO2 93% and greater. requires seated rest breaks.   Stairs             Wheelchair Mobility     Tilt Bed    Modified Rankin (Stroke Patients Only)       Balance Overall balance assessment: Needs assistance Sitting-balance support: Single extremity supported, Feet unsupported Sitting balance-Leahy Scale: Fair Sitting balance - Comments: Posterior lean and LOB   Standing balance support: During functional activity, Bilateral upper extremity supported, Reliant on assistive device for balance Standing balance-Leahy Scale: Poor                              Communication Communication Communication: No apparent difficulties  Cognition Arousal: Alert Behavior During Therapy: WFL for tasks assessed/performed   PT - Cognitive impairments: No apparent impairments                       PT - Cognition Comments: anxious about prospect of falling, requires frequent encouragement Following commands: Intact      Cueing    Exercises      General Comments        Pertinent Vitals/Pain Pain Assessment Pain Assessment: Faces Faces Pain Scale: Hurts little more Pain Location: back Pain Descriptors / Indicators: Sore, Discomfort, Moaning Pain Intervention(s): Limited activity  within patient's tolerance, Monitored during session, Repositioned, Relaxation    Home Living                          Prior Function            PT Goals (current goals can now be found in the care plan section) Acute Rehab PT Goals Patient Stated Goal: Patient wants to be able to walk with her walker household distances. PT Goal Formulation: With patient Time For Goal Achievement: 01/05/24 Potential to Achieve Goals: Good Progress towards  PT goals: Progressing toward goals    Frequency    Min 2X/week      PT Plan      Co-evaluation              AM-PAC PT 6 Clicks Mobility   Outcome Measure  Help needed turning from your back to your side while in a flat bed without using bedrails?: A Lot Help needed moving from lying on your back to sitting on the side of a flat bed without using bedrails?: A Lot Help needed moving to and from a bed to a chair (including a wheelchair)?: A Lot Help needed standing up from a chair using your arms (e.g., wheelchair or bedside chair)?: A Lot Help needed to walk in hospital room?: A Lot Help needed climbing 3-5 steps with a railing? : Total 6 Click Score: 11    End of Session Equipment Utilized During Treatment: Oxygen Activity Tolerance: Patient tolerated treatment well Patient left: with call bell/phone within reach;Other (comment);in chair;with family/visitor present (pt states she will not get up without staff assist, and will press call button/wait for assist for back to bed.) Nurse Communication: Mobility status PT Visit Diagnosis: History of falling (Z91.81);Unsteadiness on feet (R26.81);Other abnormalities of gait and mobility (R26.89)     Time: 0932-3557 PT Time Calculation (min) (ACUTE ONLY): 21 min  Charges:    $Gait Training: 8-22 mins PT General Charges $$ ACUTE PT VISIT: 1 Visit                     Shirlene Doughty, PT DPT Acute Rehabilitation Services Secure Chat Preferred  Office [phone]    Mataio Mele Cydney Draft 12/24/2023, 4:54 PM

## 2023-12-24 NOTE — Progress Notes (Signed)
 Progress Note   Patient: Holly Spence:[id number] DOB: 01/24/1946 DOA: 12/20/2023     4 DOS: the patient was seen and examined on 12/24/2023   Brief hospital course: Mrs. Montee was admitted to the hospital with the working diagnosis of heart failure exacerbation   78 yo female with the past medical history of atrial fibrillation, T2DM, COPD,CKD and heart failure who presented with dyspnea.  Reported 3 weeks of dyspnea, associated with lower extremity edema, and non productive cough. On her initial physical examination her blood pressure was 113/84, HR 63, RR 13 and 02 saturation 94% on supplemental 02 per [place] Lungs with no wheezing or rhonchi, increased work of breathing, heart with S1 and S2 present and regular with no gallops, rubs or murmurs, abdomen with no distention and positive bilateral lower extremity edema.    Na 141, K 5.4 Cl 99, bicarbonate 27 glucose 78 bun 34 cr 3,0  AST 1,744 ALT 844  BNP 1,019  High sensitive troponin 410 and 422 Lactic acid 4,9, 3,6 and 1,9   Wbc 9,2 hgb 14.1 plt 113 SARS covid 19 negative Influenza negative RSV negative   Chest radiograph with hypoinflation, positive cardiomegaly, with bilateral hilar vascular congestion with bilateral interstitial infiltrates at the lower zones with left pleural effusion. Pacemaker in place with one right atrial and one right ventricular lead.   EKG 64 bpm, left axis deviation with left bundle brunch block, qtc 523, ventricular paced rhythm with no significant ST segment or  T wave changes.   06/14 clinically improving but continue volume overloaded.  06/15 continue diuresis   Assessment and Plan: * Acute on chronic diastolic CHF (congestive heart failure) (HCC) Echocardiogram with preserved LV systolic function with EF 60 to 65%, mild LVH, RV not well visualized, trivial pericardial effusion.   Patient continue volume overloaded.  Lactic acid is trended down.  Urine output 1,550 ml  Her weight is down 4 Kg  from yesterday and 7 kg since admission.  Systolic blood pressure in the 100 mmHg range.   Continue diuresis with furosemide , 80 mg IV bid 06/15 2.5 mg metolazone  Continue with metoprolol .   High sensitive troponin elevation due to heart failure. No signs of acute coronary syndrome,.  Acute hypoxemic respiratory failure due to acute cardiogenic pulmonary edema, continue diuresis with furosemide   No clinical signs of pneumonia, hold on antibiotic therapy. (Pneumonia ruled out).  Her 02 saturation today is 98 % on 2 L/min   Congestive hepatopathy, continue diuresis  AST and ALT are trending down.  No encephalopathy or clinical signs of liver failure.   Persistent atrial fibrillation (HCC) Ventricular paced rhythm.   Continue with metoprolol   discontinue amiodarone  due to elevation in LFT  Anticoagulation with warfarin, patient not able to afford direct oral anticoagulants.   Essential hypertension Continue blood pressure control with metoprolol ,  Continue diuresis with IV furosemide    Acute renal failure superimposed on stage 3b chronic kidney disease (HCC) Hyperkalemia.   Renal function today with serum cr at 2,36 with K at 4,3 and serum bicarbonate at 37  Na 138   Continue loop diuretic.  Follow up renal function and electrolytes tomorrow  Avoid hypotension and nephrotoxic medications   Thrombocytopenia (HCC) Cell count stable.   Type 2 diabetes mellitus with hyperlipidemia (HCC) Continue glucose cover and monitoring with insulin  sliding scale.  Patient is tolerating po well.  Fasting glucose this am 125 mg/dl   COPD (chronic obstructive pulmonary disease) (HCC) No signs of acute exacerbation,  continue with bronchodilator therapy Continue 02 monitoring and supplemental 02 to keep 02 saturation 92% or greater.,  On ICS and LABA.   Restless leg syndrome Continue with ropinirole .   Obesity, class 3 Calculated BMI is 56.1       Subjective: Patient with  improvement in dyspnea and edema but not back to baseline, no chest pain   Physical Exam: Vitals:   12/24/23 0822 12/24/23 1211 12/24/23 1400 12/24/23 1458  BP: (!) 107/47 (!) 118/56    Pulse: 61 (!) 59  61  Resp:  19  16  Temp:      TempSrc:      SpO2:  98%  98%  Weight:   (!) 136.6 kg   Height:       Neurology awake and alert ENT with mild pallor Cardiovascular with S1 and S2 present and regular with no gallops, rubs or murmurs Respiratory with mild rales at bases, distant breath sounds Abdomen with no distention and non tender, protuberant Positive lower extremity edema +++ pitting at the thighs   Data Reviewed:    Family Communication: no family at the bedside   Disposition: Status is: Inpatient Remains inpatient appropriate because: IV diuresis   Planned Discharge Destination: Home    Author: Albertus Alt, MD 12/24/2023 3:26 PM  For on call review [email or web address].

## 2023-12-24 NOTE — Progress Notes (Signed)
 PHARMACY - ANTICOAGULATION CONSULT NOTE  Pharmacy Consult for Warfarin Indication: atrial fibrillation  Allergies  Allergen Reactions   Penicillins Hives and Other (See Comments)    At injection site only- hives   Sulfa Antibiotics Hives   Erythromycin Other (See Comments)    Unsure    Azithromycin Rash and Other (See Comments)    Broke out the legs   Ciprofloxacin Hcl Other (See Comments)    Abdominal Pain    Patient Measurements: Height: 5' 3 (160 cm) Weight: (!) 143.6 kg (316 lb 9.3 oz) IBW/kg (Calculated) : 52.4 HEPARIN  DW (KG): 89  Vital Signs: Temp: 98.4 F (36.9 C) (06/16 0756) Temp Source: Oral (06/16 0756) BP: 107/47 (06/16 0822) Pulse Rate: 61 (06/16 0822)  Labs: Recent Labs    12/22/23 0251 12/23/23 0313 12/24/23 0330  HGB 13.0 12.1  --   HCT 44.8 41.9  --   PLT 115* 79*  --   CREATININE 2.73* 2.65* 2.36*    Estimated Creatinine Clearance: 26.7 mL/min (A) (by C-G formula based on SCr of 2.36 mg/dL (H)).   Medical History: Past Medical History:  Diagnosis Date   Allergic rhinitis 09/20/2015   Arthritis    Ascending aorta dilation (HCC) 12/09/2018   Back pain    Benign essential hypertension 09/20/2015   Bruises easily    Complete heart block (HCC) 06/11/2020   Contact lens/glasses fitting    Cough    Depression 02/08/2019   Diabetes mellitus due to underlying condition with unspecified complications (HCC) 11/10/2015   Duodenal ulcer 02/08/2019   Fatigue    Gastroesophageal reflux disease 09/20/2015   GI bleeding 06/27/2019   Hx of laparoscopic gastric banding 01/31/2011   Surgery: 03/22/10    Hyperlipidemia    Insulin  long-term use (HCC) 09/20/2015   Melena 02/08/2019   Moderate asthma 02/08/2019   Palpitations 11/10/2015   Poor circulation    Right renal mass 02/08/2019   Sinus problem    Sleep apnea    SOB (shortness of breath)    Symptomatic bradycardia 06/11/2020   Uncontrolled type 2 diabetes mellitus without complication, with long-term current  use of insulin  09/20/2015    Medications:  Medications Prior to Admission  Medication Sig Dispense Refill Last Dose/Taking   albuterol  (VENTOLIN  HFA) 108 (90 Base) MCG/ACT inhaler Inhale 2 puffs into the lungs every 6 (six) hours as needed for wheezing or shortness of breath.    12/19/2023   amiodarone  (PACERONE ) 200 MG tablet Take 1 tablet (200 mg total) by mouth daily. (Patient taking differently: Take 200 mg by mouth in the morning.) 30 tablet 3 12/20/2023   cyanocobalamin 1000 MCG tablet Take 1,000 mcg by mouth in the morning.   12/20/2023   diclofenac sodium (VOLTAREN) 1 % GEL Apply 2-4 g topically every 6 (six) hours as needed (pain).   Unknown   ferrous sulfate 325 (65 FE) MG EC tablet Take 325 mg by mouth at bedtime.   12/19/2023   gabapentin  (NEURONTIN ) 300 MG capsule Take 600 mg by mouth at bedtime.   12/19/2023   insulin  NPH-regular Human (70-30) 100 UNIT/ML injection Inject 55-90 Units into the skin 2 (two) times daily with a meal. Per sliding scale   12/20/2023 Morning   liver oil-zinc  oxide (DESITIN) 40 % ointment Apply topically 2 (two) times daily. (Patient taking differently: Apply 1 Application topically 3 (three) times daily as needed for irritation.) 56.7 g 0 Unknown   metoprolol  succinate (TOPROL  XL) 25 MG 24 hr tablet Take 1 tablet (  25 mg total) by mouth daily. (Patient taking differently: Take 25 mg by mouth in the morning.) 90 tablet 3 12/20/2023   pantoprazole  (PROTONIX ) 40 MG tablet Take 40 mg by mouth daily before breakfast.   12/20/2023   rOPINIRole  (REQUIP ) 4 MG tablet Take 4 mg by mouth in the morning.   12/20/2023   torsemide  (DEMADEX ) 20 MG tablet Take 3 tablets (60 mg total) by mouth daily. (Patient taking differently: Take 60 mg by mouth in the morning.) 90 tablet 5 12/19/2023   traMADol  (ULTRAM ) 50 MG tablet Take 100 mg by mouth in the morning and at bedtime.   12/20/2023 Morning   Scheduled:   benzonatate  100 mg Oral TID   ferrous sulfate  325 mg Oral QHS   fluticasone   furoate-vilanterol  1 puff Inhalation Daily   furosemide   80 mg Intravenous Q12H   gabapentin   600 mg Oral QHS   Gerhardt's butt cream   Topical BID   insulin  aspart  0-6 Units Subcutaneous Q4H   ipratropium-albuterol   3 mL Nebulization TID   metoprolol  succinate  25 mg Oral Daily   nystatin   Topical BID   rOPINIRole   4 mg Oral q AM   sodium chloride  flush  3 mL Intravenous Q12H   warfarin  5 mg Oral ONCE-1600   Warfarin - Pharmacist Dosing Inpatient   Does not apply q1600   PRN: acetaminophen  **OR** acetaminophen , albuterol , mouth rinse, oxyCODONE , trimethobenzamide   Assessment: Holly Spence is a 78 year old female with PMH DM2, HLD, OSA, and PAF. She is being switched from Eliquis  to warfarin as the result of cost issues. Her CBC is stable. Her last dose of Eliquis  2.5 mg was this morning. Given CHADSVAS 6, no recent cardioversion, and no recent ablaitons will not use bridge therapy. Plan to start warfarin tonight.   Goal of Therapy:  INR 2-3 Monitor platelets by anticoagulation protocol: Yes   Plan:  Warfarin 5 mg x1  Monitor INR, CBC, and signs of bleeding Monitor diet and DDI with concomitant therapies  Albino Alu 12/24/2023,10:23 AM

## 2023-12-24 NOTE — Progress Notes (Signed)
 Mobility Specialist Progress Note:   12/24/23 1402  Mobility  Activity Transferred from bed to chair  Level of Assistance Minimal assist, patient does 75% or more (+2)  Assistive Device Front wheel walker  Distance Ambulated (ft) 1402 ft  Activity Response Tolerated well  Mobility Referral Yes  Mobility visit 1 Mobility  Mobility Specialist Start Time (ACUTE ONLY) 1402  Mobility Specialist Stop Time (ACUTE ONLY) 1415  Mobility Specialist Time Calculation (min) (ACUTE ONLY) 13 min   Pt agreeable to mobility session. Required minA+2 to stand and pivot to chair. Pt very fearful of falling. Left with all needs met.   Holly Spence Mobility Specialist Please contact via SecureChat or  Rehab office at [phone]

## 2023-12-24 NOTE — Progress Notes (Signed)
 Rounding Note   Patient Name: Holly Spence Date of Encounter: 12/24/2023  [facility] Cardiologist: Nelia Balzarine, MD   Subjective Net -1.0L yesterday, -3.7 L on admission. Creatinine improving (3.0 >2.80 > 2.73 > 2.65 >2.236).  Liver enzymes continue to improve.  BP 107/47.  She reports dyspnea improving   Scheduled Meds:  apixaban   2.5 mg Oral BID   benzonatate  100 mg Oral TID   ferrous sulfate  325 mg Oral QHS   fluticasone  furoate-vilanterol  1 puff Inhalation Daily   furosemide   80 mg Intravenous Q12H   gabapentin   600 mg Oral QHS   Gerhardt's butt cream   Topical BID   insulin  aspart  0-6 Units Subcutaneous Q4H   ipratropium-albuterol   3 mL Nebulization TID   metoprolol  succinate  25 mg Oral Daily   nystatin   Topical BID   rOPINIRole   4 mg Oral q AM   sodium chloride  flush  3 mL Intravenous Q12H   Continuous Infusions:  PRN Meds: acetaminophen  **OR** acetaminophen , albuterol , mouth rinse, oxyCODONE , trimethobenzamide    Vital Signs  Vitals:   12/24/23 0005 12/24/23 0420 12/24/23 0756 12/24/23 0822  BP: (!) 116/54 (!) 91/42 (!) 107/47 (!) 107/47  Pulse: 65 60 61 61  Resp: 20 17 16    Temp: 97.7 F (36.5 C) 98.4 F (36.9 C) 98.4 F (36.9 C)   TempSrc: Oral Axillary Oral   SpO2: 93% 93% 93%   Weight:  (!) 143.6 kg    Height:        Intake/Output Summary (Last 24 hours) at 12/24/2023 0959 Last data filed at 12/24/2023 0006 Gross per 24 hour  Intake 290 ml  Output 1150 ml  Net -860 ml      12/24/2023    4:20 AM 12/23/2023    4:42 AM 12/22/2023    4:38 AM  Last 3 Weights  Weight (lbs) 316 lb 9.3 oz 309 lb 15.5 oz 318 lb 9 oz  Weight (kg) 143.6 kg 140.6 kg 144.5 kg      Telemetry V paced- Personally Reviewed  ECG  No new ECG- Personally Reviewed  Physical Exam  GEN: No acute distress.   Neck: Difficult to assess given habitus Cardiac: RRR, no murmurs, rubs, or gallops.  Respiratory: Scattered rhonchi GI: Soft, nontender,  non-distended  MS: 1+  edema Neuro:  Nonfocal  Psych: Normal affect   Labs High Sensitivity Troponin:   Recent Labs  Lab 12/20/23 1815 12/20/23 2016 12/21/23 1007  TROPONINIHS 410* 425* 442*     Chemistry Recent Labs  Lab 12/21/23 0241 12/22/23 0251 12/23/23 0313 12/24/23 0330  NA 139 139 138 138  K 5.2* 5.1 4.6 4.3  CL 99 98 96* 94*  CO2 30 31 33* 37*  GLUCOSE 157* 146* 146* 125*  BUN 34* 45* 50* 52*  CREATININE 2.88* 2.73* 2.65* 2.36*  CALCIUM  8.2* 8.0* 7.9* 8.2*  MG 2.5*  --  2.3 2.2  PROT 5.1* 5.2* 5.0* 5.5*  ALBUMIN 2.5* 2.7* 2.6* 3.0*  AST 1,449* 513* 145* 67*  ALT 834* 661* 439* 313*  ALKPHOS 84 91 78 83  BILITOT 2.1* 1.0 1.0 1.2  GFRNONAA 16* 17* 18* 20*  ANIONGAP 10 10 9 7     Lipids No results for input(s): CHOL, TRIG, HDL, LABVLDL, LDLCALC, CHOLHDL in the last 168 hours.  Hematology Recent Labs  Lab 12/21/23 0241 12/22/23 0251 12/23/23 0313  WBC 6.3 5.8 5.5  RBC 5.03 4.91 4.53  HGB 13.6 13.0 12.1  HCT 46.8* 44.8 41.9  MCV 93.0 91.2 92.5  MCH 27.0 26.5 26.7  MCHC 29.1* 29.0* 28.9*  RDW 20.2* 20.5* 20.3*  PLT 105* 115* 79*   Thyroid  No results for input(s): TSH, FREET4 in the last 168 hours.  BNP Recent Labs  Lab 12/20/23 1821  BNP 1,019.0*    DDimer No results for input(s): DDIMER in the last 168 hours.   Radiology  No results found.   Cardiac Studies   Patient Profile   78 y.o. female with history of atrial fibrillation, T2DM, COPD, CKD, diastolic heart failure who presents with acute hypoxic respiratory failure  Assessment & Plan  Acute on chronic diastolic heart failure: Echocardiogram with very limited views but grossly normal LV systolic function.  Difficult to assess volume status given body habitus but appears hypervolemic -Continue IV lasix  -Strict I's/O's and daily weights  Elevated troponin: 410 > 425 > 442.  Flat troponin, not consistent with acute coronary syndrome.  Suspect demand ischemia in  setting of decompensated heart failure.  Persistent Afib: Currently in atrial sensed, Vpaced rhythm.  Continue Eliquis , metoprolol .  Holding amiodarone  given transaminitis  AKI on CKD: Creatinine 3.0 on admission, will monitor closely with diuresis.  Improving, creatinine 2.36 today  Morbid obesity: Body mass index is 56.08 kg/m.  Transaminitis: Suspect congestive hepatopathy, improving with diuresis.  Amiodarone  held given transaminitis   For questions or updates, please contact [facility] Please consult [email or web address] for contact info under     Signed, Wendie Hamburg, MD  12/24/2023, 9:59 AM

## 2023-12-24 NOTE — Telephone Encounter (Addendum)
 Patient Product/process development scientist completed.    The patient is insured through [facility]. Patient has Medicare and is not eligible for a copay card, but may be able to apply for patient assistance or Medicare RX Payment Plan (Patient Must reach out to their plan, if eligible for payment plan), if available.    Ran test claim for Eliquis  5 mg and the current 30 day co-pay is $346.76 due to a deductible.  Ran test claim for dabigatran (Pradaxa) 150 mg and the current 30 day co-pay is $159.08 due to a deductible.   This test claim was processed through [facility]- copay amounts may vary at other pharmacies due to pharmacy/plan contracts, or as the patient moves through the different stages of their insurance plan.     Morgan Arab, CPHT Pharmacy Technician III Certified Patient Advocate [facility] Patient Advocate Team Direct Number: [phone]  Fax: [phone]

## 2023-12-24 NOTE — Telephone Encounter (Signed)
 Scheduled pt with [facility] next Tuesday, on 01/01/24. Message sent to Conway Dennis to make her aware of appt.

## 2023-12-24 NOTE — Care Management Important Message (Signed)
 Important Message  Patient Details  Name: Holly Spence MRN: [id number] Date of Birth: [birth year]   Important Message Given:  Yes - Medicare IM     Janith Melnick 12/24/2023, 10:25 AM

## 2023-12-24 NOTE — Telephone Encounter (Signed)
-----   Message from Brunilda Capra sent at 12/24/2023  1:47 PM EDT ----- Patient #2 follows with [facility]  Afib with high copay for apixaban  and not eligible for PAP  Could you make appt with [facility] for after DC Today is her 1sr warfarin dose also    Thanks  Hortensia Ma

## 2023-12-25 DIAGNOSIS — N179 Acute kidney failure, unspecified: Secondary | ICD-10-CM | POA: Diagnosis not present

## 2023-12-25 DIAGNOSIS — I4819 Other persistent atrial fibrillation: Secondary | ICD-10-CM | POA: Diagnosis not present

## 2023-12-25 DIAGNOSIS — I5033 Acute on chronic diastolic (congestive) heart failure: Secondary | ICD-10-CM | POA: Diagnosis not present

## 2023-12-25 DIAGNOSIS — N1832 Chronic kidney disease, stage 3b: Secondary | ICD-10-CM | POA: Diagnosis not present

## 2023-12-25 DIAGNOSIS — I10 Essential (primary) hypertension: Secondary | ICD-10-CM | POA: Diagnosis not present

## 2023-12-25 LAB — COMPREHENSIVE METABOLIC PANEL WITH GFR
ALT: 201 U/L — ABNORMAL HIGH (ref 0–44)
AST: 38 U/L (ref 15–41)
Albumin: 2.7 g/dL — ABNORMAL LOW (ref 3.5–5.0)
Alkaline Phosphatase: 64 U/L (ref 38–126)
Anion gap: 7 (ref 5–15)
BUN: 51 mg/dL — ABNORMAL HIGH (ref 8–23)
CO2: 40 mmol/L — ABNORMAL HIGH (ref 22–32)
Calcium: 8.2 mg/dL — ABNORMAL LOW (ref 8.9–10.3)
Chloride: 92 mmol/L — ABNORMAL LOW (ref 98–111)
Creatinine, Ser: 2.19 mg/dL — ABNORMAL HIGH (ref 0.44–1.00)
GFR, Estimated: 22 mL/min — ABNORMAL LOW (ref >60)
Glucose, Bld: 104 mg/dL — ABNORMAL HIGH (ref 70–99)
Potassium: 3.9 mmol/L (ref 3.5–5.1)
Sodium: 139 mmol/L (ref 135–145)
Total Bilirubin: 1.3 mg/dL — ABNORMAL HIGH (ref 0.0–1.2)
Total Protein: 5.2 g/dL — ABNORMAL LOW (ref 6.5–8.1)

## 2023-12-25 LAB — PROTIME-INR
INR: 1.4 — ABNORMAL HIGH (ref 0.8–1.2)
Prothrombin Time: 17.3 s — ABNORMAL HIGH (ref 11.4–15.2)

## 2023-12-25 LAB — CBC
HCT: 45 % (ref 36.0–46.0)
Hemoglobin: 12.9 g/dL (ref 12.0–15.0)
MCH: 26.8 pg (ref 26.0–34.0)
MCHC: 28.7 g/dL — ABNORMAL LOW (ref 30.0–36.0)
MCV: 93.6 fL (ref 80.0–100.0)
Platelets: 59 10*3/uL — ABNORMAL LOW (ref 150–400)
RBC: 4.81 MIL/uL (ref 3.87–5.11)
RDW: 19.7 % — ABNORMAL HIGH (ref 11.5–15.5)
WBC: 5.4 10*3/uL (ref 4.0–10.5)
nRBC: 0 % (ref 0.0–0.2)

## 2023-12-25 LAB — GLUCOSE, CAPILLARY
Glucose-Capillary: 92 mg/dL (ref 70–99)
Glucose-Capillary: 202 mg/dL — ABNORMAL HIGH (ref 70–99)
Glucose-Capillary: 191 mg/dL — ABNORMAL HIGH (ref 70–99)
Glucose-Capillary: 365 mg/dL — ABNORMAL HIGH (ref 70–99)
Glucose-Capillary: 168 mg/dL — ABNORMAL HIGH (ref 70–99)

## 2023-12-25 LAB — MAGNESIUM: Magnesium: 2 mg/dL (ref 1.7–2.4)

## 2023-12-25 MED ORDER — ACETAZOLAMIDE SODIUM 500 MG IJ SOLR
250.0000 mg | Freq: Once | INTRAMUSCULAR | Status: AC
  Administered 2023-12-25: 250 mg via INTRAVENOUS
  Filled 2023-12-25: qty 250

## 2023-12-25 MED ORDER — INSULIN ASPART 100 UNIT/ML IJ SOLN
0.0000 [IU] | Freq: Three times a day (TID) | INTRAMUSCULAR | Status: DC
  Administered 2023-12-25: 3 [IU] via SUBCUTANEOUS
  Administered 2023-12-25: 2 [IU] via SUBCUTANEOUS
  Administered 2023-12-26: 1 [IU] via SUBCUTANEOUS
  Administered 2023-12-26: 2 [IU] via SUBCUTANEOUS
  Administered 2023-12-27: 3 [IU] via SUBCUTANEOUS
  Administered 2023-12-28: 5 [IU] via SUBCUTANEOUS
  Administered 2023-12-28: 2 [IU] via SUBCUTANEOUS
  Administered 2023-12-28: 1 [IU] via SUBCUTANEOUS
  Administered 2023-12-29: 2 [IU] via SUBCUTANEOUS
  Administered 2023-12-29: 1 [IU] via SUBCUTANEOUS

## 2023-12-25 MED ORDER — STERILE WATER FOR INJECTION IJ SOLN
INTRAMUSCULAR | Status: AC
  Filled 2023-12-25: qty 10

## 2023-12-25 MED ORDER — WARFARIN SODIUM 5 MG PO TABS
5.0000 mg | ORAL_TABLET | Freq: Once | ORAL | Status: AC
  Administered 2023-12-25: 5 mg via ORAL
  Filled 2023-12-25: qty 1

## 2023-12-25 NOTE — Progress Notes (Signed)
 Rounding Note   Patient Name: Holly Spence Date of Encounter: 12/25/2023  [facility] Cardiologist: Nelia Balzarine, MD   Subjective Net -3.8L yesterday, -7.5 L on admission. Creatinine improving (3.0 >2.80 > 2.73 > 2.65 >2.36>2.19).  Liver enzymes continue to improve.  BP 107/46.  She reports dyspnea improving   Scheduled Meds:  benzonatate  100 mg Oral TID   ferrous sulfate  325 mg Oral QHS   fluticasone  furoate-vilanterol  1 puff Inhalation Daily   furosemide   80 mg Intravenous Q12H   gabapentin   600 mg Oral QHS   Gerhardt's butt cream   Topical BID   insulin  aspart  0-9 Units Subcutaneous TID WC   metoprolol  succinate  25 mg Oral Daily   nystatin   Topical BID   rOPINIRole   4 mg Oral q AM   sodium chloride  flush  3 mL Intravenous Q12H   warfarin  5 mg Oral ONCE-1600   Warfarin - Pharmacist Dosing Inpatient   Does not apply q1600   Continuous Infusions:  PRN Meds: acetaminophen  **OR** acetaminophen , albuterol , mouth rinse, oxyCODONE , trimethobenzamide    Vital Signs  Vitals:   12/25/23 0543 12/25/23 0731 12/25/23 0732 12/25/23 0815  BP:  (!) 107/46  (!) 107/46  Pulse:    61  Resp:  16    Temp:  98.2 F (36.8 C)    TempSrc:  Oral    SpO2:  93% 93%   Weight: 130.6 kg     Height:        Intake/Output Summary (Last 24 hours) at 12/25/2023 1000 Last data filed at 12/25/2023 0947 Gross per 24 hour  Intake 540 ml  Output 4125 ml  Net -3585 ml      12/25/2023    5:43 AM 12/24/2023    2:00 PM 12/24/2023    4:20 AM  Last 3 Weights  Weight (lbs) 288 lb 301 lb 2.4 oz 316 lb 9.3 oz  Weight (kg) 130.636 kg 136.6 kg 143.6 kg      Telemetry V paced- Personally Reviewed  ECG  No new ECG- Personally Reviewed  Physical Exam  GEN: No acute distress.   Neck: Difficult to assess given habitus Cardiac: RRR, no murmurs, rubs, or gallops.  Respiratory: Scattered rhonchi GI: Soft, nontender, non-distended  MS: 1+  edema Neuro:  Nonfocal  Psych: Normal  affect   Labs High Sensitivity Troponin:   Recent Labs  Lab 12/20/23 1815 12/20/23 2016 12/21/23 1007  TROPONINIHS 410* 425* 442*     Chemistry Recent Labs  Lab 12/23/23 0313 12/24/23 0330 12/25/23 0324  NA 138 138 139  K 4.6 4.3 3.9  CL 96* 94* 92*  CO2 33* 37* 40*  GLUCOSE 146* 125* 104*  BUN 50* 52* 51*  CREATININE 2.65* 2.36* 2.19*  CALCIUM  7.9* 8.2* 8.2*  MG 2.3 2.2 2.0  PROT 5.0* 5.5* 5.2*  ALBUMIN 2.6* 3.0* 2.7*  AST 145* 67* 38  ALT 439* 313* 201*  ALKPHOS 78 83 64  BILITOT 1.0 1.2 1.3*  GFRNONAA 18* 20* 22*  ANIONGAP 9 7 7     Lipids No results for input(s): CHOL, TRIG, HDL, LABVLDL, LDLCALC, CHOLHDL in the last 168 hours.  Hematology Recent Labs  Lab 12/22/23 0251 12/23/23 0313 12/25/23 0751  WBC 5.8 5.5 5.4  RBC 4.91 4.53 4.81  HGB 13.0 12.1 12.9  HCT 44.8 41.9 45.0  MCV 91.2 92.5 93.6  MCH 26.5 26.7 26.8  MCHC 29.0* 28.9* 28.7*  RDW 20.5* 20.3* 19.7*  PLT  115* 79* 59*   Thyroid  No results for input(s): TSH, FREET4 in the last 168 hours.  BNP Recent Labs  Lab 12/20/23 1821  BNP 1,019.0*    DDimer No results for input(s): DDIMER in the last 168 hours.   Radiology  No results found.   Cardiac Studies   Patient Profile   78 y.o. female with history of atrial fibrillation, T2DM, COPD, CKD, diastolic heart failure who presents with acute hypoxic respiratory failure  Assessment & Plan  Acute on chronic diastolic heart failure: Echocardiogram with very limited views but grossly normal LV systolic function.  Difficult to assess volume status given body habitus but appears hypervolemic, continues to have crackles on lung exam and LE edema -Continue IV lasix .  Worsening alkalosis, will add dose of diamox today -Strict I's/O's and daily weights  Elevated troponin: 410 > 425 > 442.  Flat troponin, not consistent with acute coronary syndrome.  Suspect demand ischemia in setting of decompensated heart failure.  Persistent  Afib: Currently in atrial sensed, Vpaced rhythm.  Continue Eliquis , metoprolol .  Holding amiodarone  given transaminitis, will restart soon as transaminitis continues to improve  AKI on CKD: Creatinine 3.0 on admission, will monitor closely with diuresis.  Improving, creatinine 2.19 today  Morbid obesity: Body mass index is 51.02 kg/m.  Transaminitis: Suspect congestive hepatopathy, improving with diuresis.  Amiodarone  held given transaminitis, will plan to restart prior to discharge as transaminitis improves   For questions or updates, please contact [facility] Please consult [email or web address] for contact info under     Signed, Wendie Hamburg, MD  12/25/2023, 10:00 AM

## 2023-12-25 NOTE — Progress Notes (Signed)
 Progress Note   Patient: Holly Spence ZOX:[id number] DOB: 1946/06/24 DOA: 12/20/2023     5 DOS: the patient was seen and examined on 12/25/2023   Brief hospital course: Mrs. Holly Spence was admitted to the hospital with the working diagnosis of heart failure exacerbation   78 yo female with the past medical history of atrial fibrillation, T2DM, COPD,CKD and heart failure who presented with dyspnea.  Reported 3 weeks of dyspnea, associated with lower extremity edema, and non productive cough. On her initial physical examination her blood pressure was 113/84, HR 63, RR 13 and 02 saturation 94% on supplemental 02 per [place] Lungs with no wheezing or rhonchi, increased work of breathing, heart with S1 and S2 present and regular with no gallops, rubs or murmurs, abdomen with no distention and positive bilateral lower extremity edema.    Na 141, K 5.4 Cl 99, bicarbonate 27 glucose 78 bun 34 cr 3,0  AST 1,744 ALT 844  BNP 1,019  High sensitive troponin 410 and 422 Lactic acid 4,9, 3,6 and 1,9   Wbc 9,2 hgb 14.1 plt 113 SARS covid 19 negative Influenza negative RSV negative   Chest radiograph with hypoinflation, positive cardiomegaly, with bilateral hilar vascular congestion with bilateral interstitial infiltrates at the lower zones with left pleural effusion. Pacemaker in place with one right atrial and one right ventricular lead.   EKG 64 bpm, left axis deviation with left bundle brunch block, qtc 523, ventricular paced rhythm with no significant ST segment or  T wave changes.   06/14 clinically improving but continue volume overloaded.  06/15 continue diuresis  06/17 volume status is improving, but continue with significant volume overload.   Assessment and Plan: * Acute on chronic diastolic CHF (congestive heart failure) (HCC) Echocardiogram with preserved LV systolic function with EF 60 to 65%, mild LVH, RV not well visualized, trivial pericardial effusion.   Patient continue volume  overloaded.  Lactic acid is trended down.  Urine output 4,125 ml  Systolic blood pressure in the 100 mmHg range.   Continue diuresis with furosemide , 80 mg IV bid 06/15 2.5 mg metolazone  06/17 acetazolamide 500 mg  Continue with metoprolol .   High sensitive troponin elevation due to heart failure. No signs of acute coronary syndrome,.  Acute hypoxemic respiratory failure due to acute cardiogenic pulmonary edema, continue diuresis with furosemide   No clinical signs of pneumonia, hold on antibiotic therapy. (Pneumonia ruled out).  Her 02 saturation today is 95 % on 2 L/min   Congestive hepatopathy, continue diuresis  AST and ALT are trending down.  No encephalopathy or clinical signs of liver failure.   Persistent atrial fibrillation (HCC) Ventricular paced rhythm.   Continue with metoprolol   discontinue amiodarone  due to elevation in LFT  Anticoagulation with warfarin, patient not able to afford direct oral anticoagulants.   Essential hypertension Continue blood pressure control with metoprolol ,  Continue diuresis with IV furosemide    Acute renal failure superimposed on stage 3b chronic kidney disease (HCC) Hyperkalemia.   Continue to improved renal function with serum cr at 2,19 with K at 3,9 and serum bicarbonate at 40  Na 139 and Mg 2.0   Continue loop diuretic.  Follow up renal function and electrolytes tomorrow  Avoid hypotension and nephrotoxic medications   Thrombocytopenia (HCC) Cell count stable.   Type 2 diabetes mellitus with hyperlipidemia (HCC) Continue glucose cover and monitoring with insulin  sliding scale.  Patient is tolerating po well.  Fasting glucose this am 104 mg/dl   COPD (chronic obstructive  pulmonary disease) (HCC) No signs of acute exacerbation, continue with bronchodilator therapy Continue 02 monitoring and supplemental 02 to keep 02 saturation 92% or greater.,  On ICS and LABA.   Restless leg syndrome Continue with ropinirole .    Obesity, class 3 Calculated BMI is 56.1       Subjective: Patient is out of bed in the chair today, her dyspnea has improved along with edema but not back to baseline., no chest pain   Physical Exam: Vitals:   12/25/23 0731 12/25/23 0732 12/25/23 0815 12/25/23 1107  BP: (!) 107/46  (!) 107/46 (!) 126/58  Pulse:   61   Resp: 16   (!) 22  Temp: 98.2 F (36.8 C)     TempSrc: Oral     SpO2: 93% 93%  95%  Weight:      Height:       Neurology awake and alert ENT with mild pallor Cardiovascular with S1 and S2 present and regular with no gallops, rubs or murmurs Respiratory with bilateral rales and scattered rhonchi , with no wheezing Abdomen with no distention, positive abdominal wall pitting edema Lower extremity edema ++ distal legs and +++ thighs and abdominal walll  Data Reviewed:    Family Communication: no family at the bedside   Disposition: Status is: Inpatient Remains inpatient appropriate because: IV diuresis   Planned Discharge Destination: Skilled nursing facility     Author: Albertus Alt, MD 12/25/2023 4:22 PM  For on call review [email or web address].

## 2023-12-25 NOTE — Progress Notes (Signed)
 PHARMACY - ANTICOAGULATION CONSULT NOTE  Pharmacy Consult for Warfarin Indication: atrial fibrillation  Allergies  Allergen Reactions   Penicillins Hives and Other (See Comments)    At injection site only- hives   Sulfa Antibiotics Hives   Erythromycin Other (See Comments)    Unsure    Azithromycin Rash and Other (See Comments)    Broke out the legs   Ciprofloxacin Hcl Other (See Comments)    Abdominal Pain    Patient Measurements: Height: 5' 3 (160 cm) Weight: 130.6 kg (288 lb) IBW/kg (Calculated) : 52.4 HEPARIN  DW (KG): 89  Vital Signs: Temp: 98.2 F (36.8 C) (06/17 0731) Temp Source: Oral (06/17 0731) BP: 107/46 (06/17 0815) Pulse Rate: 61 (06/17 0815)  Labs: Recent Labs    12/23/23 0313 12/24/23 0330 12/25/23 0324 12/25/23 0751  HGB 12.1  --   --  12.9  HCT 41.9  --   --  45.0  PLT 79*  --   --  59*  LABPROT  --   --   --  17.3*  INR  --   --   --  1.4*  CREATININE 2.65* 2.36* 2.19*  --     Estimated Creatinine Clearance: 27.1 mL/min (A) (by C-G formula based on SCr of 2.19 mg/dL (H)).   Medical History: Past Medical History:  Diagnosis Date   Allergic rhinitis 09/20/2015   Arthritis    Ascending aorta dilation (HCC) 12/09/2018   Back pain    Benign essential hypertension 09/20/2015   Bruises easily    Complete heart block (HCC) 06/11/2020   Contact lens/glasses fitting    Cough    Depression 02/08/2019   Diabetes mellitus due to underlying condition with unspecified complications (HCC) 11/10/2015   Duodenal ulcer 02/08/2019   Fatigue    Gastroesophageal reflux disease 09/20/2015   GI bleeding 06/27/2019   Hx of laparoscopic gastric banding 01/31/2011   Surgery: 03/22/10    Hyperlipidemia    Insulin  long-term use (HCC) 09/20/2015   Melena 02/08/2019   Moderate asthma 02/08/2019   Palpitations 11/10/2015   Poor circulation    Right renal mass 02/08/2019   Sinus problem    Sleep apnea    SOB (shortness of breath)    Symptomatic bradycardia 06/11/2020    Uncontrolled type 2 diabetes mellitus without complication, with long-term current use of insulin  09/20/2015    Medications:  Medications Prior to Admission  Medication Sig Dispense Refill Last Dose/Taking   albuterol  (VENTOLIN  HFA) 108 (90 Base) MCG/ACT inhaler Inhale 2 puffs into the lungs every 6 (six) hours as needed for wheezing or shortness of breath.    12/19/2023   amiodarone  (PACERONE ) 200 MG tablet Take 1 tablet (200 mg total) by mouth daily. (Patient taking differently: Take 200 mg by mouth in the morning.) 30 tablet 3 12/20/2023   cyanocobalamin 1000 MCG tablet Take 1,000 mcg by mouth in the morning.   12/20/2023   diclofenac sodium (VOLTAREN) 1 % GEL Apply 2-4 g topically every 6 (six) hours as needed (pain).   Unknown   ferrous sulfate 325 (65 FE) MG EC tablet Take 325 mg by mouth at bedtime.   12/19/2023   gabapentin  (NEURONTIN ) 300 MG capsule Take 600 mg by mouth at bedtime.   12/19/2023   insulin  NPH-regular Human (70-30) 100 UNIT/ML injection Inject 55-90 Units into the skin 2 (two) times daily with a meal. Per sliding scale   12/20/2023 Morning   liver oil-zinc  oxide (DESITIN) 40 % ointment Apply topically 2 (two)  times daily. (Patient taking differently: Apply 1 Application topically 3 (three) times daily as needed for irritation.) 56.7 g 0 Unknown   metoprolol  succinate (TOPROL  XL) 25 MG 24 hr tablet Take 1 tablet (25 mg total) by mouth daily. (Patient taking differently: Take 25 mg by mouth in the morning.) 90 tablet 3 12/20/2023   pantoprazole  (PROTONIX ) 40 MG tablet Take 40 mg by mouth daily before breakfast.   12/20/2023   rOPINIRole  (REQUIP ) 4 MG tablet Take 4 mg by mouth in the morning.   12/20/2023   torsemide  (DEMADEX ) 20 MG tablet Take 3 tablets (60 mg total) by mouth daily. (Patient taking differently: Take 60 mg by mouth in the morning.) 90 tablet 5 12/19/2023   traMADol  (ULTRAM ) 50 MG tablet Take 100 mg by mouth in the morning and at bedtime.   12/20/2023 Morning   Scheduled:    benzonatate  100 mg Oral TID   ferrous sulfate  325 mg Oral QHS   fluticasone  furoate-vilanterol  1 puff Inhalation Daily   furosemide   80 mg Intravenous Q12H   gabapentin   600 mg Oral QHS   Gerhardt's butt cream   Topical BID   insulin  aspart  0-9 Units Subcutaneous TID WC   metoprolol  succinate  25 mg Oral Daily   nystatin   Topical BID   rOPINIRole   4 mg Oral q AM   sodium chloride  flush  3 mL Intravenous Q12H   warfarin  5 mg Oral ONCE-1600   Warfarin - Pharmacist Dosing Inpatient   Does not apply q1600   PRN: acetaminophen  **OR** acetaminophen , albuterol , mouth rinse, oxyCODONE , trimethobenzamide   Assessment: Mavi Un is a 78 year old female with PMH DM2, HLD, OSA, and PAF. She is being switched from Eliquis  to warfarin as the result of cost issues. Her CBC is stable. Her last dose of Eliquis  2.5 mg was 12/24/23 AM. Given CHADSVAS 6, no recent cardioversion, and no recent ablaitons, patient will not be bridged.  INR is subtherapeutic at 1.4 after warfarin 5 mg x1. CBC is stable today and no signs of bleeding. Patient's diet is unchanged and she is eating well.  Goal of Therapy:  INR 2-3 Monitor platelets by anticoagulation protocol: Yes   Plan:  Warfarin 5 mg x1  Monitor INR, CBC, and signs of bleeding Monitor diet and DDI with concomitant therapies  Albino Alu, PharmD PGY2 Cardiology Pharmacy Resident 12/25/2023,9:38 AM

## 2023-12-25 NOTE — Progress Notes (Addendum)
 PROGRESS NOTE    Holly Spence  HYQ:[id number] DOB: Jul 03, 1946 DOA: 12/20/2023 PCP: [facility]   78/F w history of atrial fibrillation, T2DM, COPD,CKD and diastolic CHF presented with dyspnea x 3 wks associated with lower extremity edema, and non productive cough.  In ED, + edema,  bun 34 cr 3,0 , AST 1,744 ALT 844 , BNP 1,019 , troponin 410 and 422 Lactic acid 4,9, 3,6 and 1,9   CXR wpositive cardiomegaly, with bilateral hilar vascular congestion with bilateral interstitial infiltrates  - Admitted, started on diuretics  Subjective: Feels better, overall improving, still with some dyspnea  Assessment and Plan:  Acute on chronic diastolic CHF -Echo limited views, grossly preserved EF 60 to 65%, mild LVH, RV not well visualized, trivial pericardial effusion.  -Lactic acid trended down -Improving with diuresis, 10.8 L negative, creatinine improving as well -Continue IV Lasix  80 Mg twice daily, metoprolol  - Cards following, GDMT limited by CKD - Increase activity, BMP in a.m.  Elevated troponin  - Secondary to demand, type II MI due to heart failure. No ACS.  Acute hypoxemic respiratory failure due to acute cardiogenic pulmonary edema -as above Pneumonia ruled out - Also suspect she has undiagnosed OSA/OHS, would benefit from sleep study after discharge  Congestive hepatopathy, continue diuresis  AST and ALT are trending down.   Persistent atrial fibrillation (HCC) Ventricular paced rhythm.  -Continue metoprolol   discontinue amiodarone  due to elevation in LFT  Anticoagulated with warfarin, unable to afford direct oral anticoagulants.   Acute renal failure superimposed on stage 3b chronic kidney disease (HCC) Hyperkalemia.  - Improving, likely cardiorenal, baseline creatinine around 1.5-1.7, creatinine was 3.01 on admission, now improving  Thrombocytopenia (HCC) Cell count stable.   Type 2 diabetes mellitus with hyperlipidemia (HCC) -Stable,  SSI  COPD (chronic obstructive pulmonary disease) (HCC) No signs of acute exacerbation, continue with bronchodilator therapy Continue 02 monitoring and supplemental 02 to keep 02 saturation 92% or greater.,  On ICS and LABA.   Restless leg syndrome Decreased Requip  dose  Obesity, class 3 Calculated BMI is 56.1    DVT prophylaxis: warfarin Code Status: Full Code Family Communication: None present Disposition Plan: Plan for rehab when improved  Consultants:    Procedures:   Antimicrobials:    Objective: Vitals:   12/25/23 0731 12/25/23 0732 12/25/23 0815 12/25/23 1107  BP: (!) 107/46  (!) 107/46 (!) 126/58  Pulse:   61   Resp: 16   (!) 22  Temp: 98.2 F (36.8 C)     TempSrc: Oral     SpO2: 93% 93%  95%  Weight:      Height:        Intake/Output Summary (Last 24 hours) at 12/25/2023 1530 Last data filed at 12/25/2023 1350 Gross per 24 hour  Intake 780 ml  Output 4125 ml  Net -3345 ml   Filed Weights   12/24/23 0420 12/24/23 1400 12/25/23 0543  Weight: (!) 143.6 kg (!) 136.6 kg 130.6 kg    Examination:  Obese chronically ill female laying in bed, somnolent but easily arousable, HEENT: Neck obese unable to assess JVD CVS: S1-S2, irregular rhythm Lungs: Few rhonchi, conducted upper airway sounds Extremities: 1+ edema    Data Reviewed:   CBC: Recent Labs  Lab 12/20/23 1815 12/20/23 1849 12/21/23 0241 12/22/23 0251 12/23/23 0313 12/25/23 0751  WBC 9.2  --  6.3 5.8 5.5 5.4  NEUTROABS 6.5  --  5.8  --   --   --  HGB 14.1 17.3*  17.0* 13.6 13.0 12.1 12.9  HCT 50.6* 51.0*  50.0* 46.8* 44.8 41.9 45.0  MCV 95.3  --  93.0 91.2 92.5 93.6  PLT 113*  --  105* 115* 79* 59*   Basic Metabolic Panel: Recent Labs  Lab 12/21/23 0241 12/22/23 0251 12/23/23 0313 12/24/23 0330 12/25/23 0324  NA 139 139 138 138 139  K 5.2* 5.1 4.6 4.3 3.9  CL 99 98 96* 94* 92*  CO2 30 31 33* 37* 40*  GLUCOSE 157* 146* 146* 125* 104*  BUN 34* 45* 50* 52* 51*   CREATININE 2.88* 2.73* 2.65* 2.36* 2.19*  CALCIUM  8.2* 8.0* 7.9* 8.2* 8.2*  MG 2.5*  --  2.3 2.2 2.0   GFR: Estimated Creatinine Clearance: 27.1 mL/min (A) (by C-G formula based on SCr of 2.19 mg/dL (H)). Liver Function Tests: Recent Labs  Lab 12/21/23 0241 12/22/23 0251 12/23/23 0313 12/24/23 0330 12/25/23 0324  AST 1,449* 513* 145* 67* 38  ALT 834* 661* 439* 313* 201*  ALKPHOS 84 91 78 83 64  BILITOT 2.1* 1.0 1.0 1.2 1.3*  PROT 5.1* 5.2* 5.0* 5.5* 5.2*  ALBUMIN 2.5* 2.7* 2.6* 3.0* 2.7*   Recent Labs  Lab 12/20/23 2014  LIPASE 19   No results for input(s): AMMONIA in the last 168 hours. Coagulation Profile: Recent Labs  Lab 12/21/23 0241 12/25/23 0751  INR 2.1* 1.4*   Cardiac Enzymes: No results for input(s): CKTOTAL, CKMB, CKMBINDEX, TROPONINI in the last 168 hours. BNP (last 3 results) No results for input(s): PROBNP in the last 8760 hours. HbA1C: No results for input(s): HGBA1C in the last 72 hours. CBG: Recent Labs  Lab 12/24/23 1640 12/24/23 2002 12/24/23 2328 12/25/23 0555 12/25/23 1125  GLUCAP 195* 266* 140* 92 202*   Lipid Profile: No results for input(s): CHOL, HDL, LDLCALC, TRIG, CHOLHDL, LDLDIRECT in the last 72 hours. Thyroid  Function Tests: No results for input(s): TSH, T4TOTAL, FREET4, T3FREE, THYROIDAB in the last 72 hours. Anemia Panel: No results for input(s): VITAMINB12, FOLATE, FERRITIN, TIBC, IRON, RETICCTPCT in the last 72 hours. Urine analysis:    Component Value Date/Time   COLORURINE YELLOW 07/15/2022 2012   APPEARANCEUR CLOUDY (A) 07/15/2022 2012   LABSPEC 1.014 07/15/2022 2012   PHURINE 5.0 07/15/2022 2012   GLUCOSEU >=500 (A) 07/15/2022 2012   HGBUR SMALL (A) 07/15/2022 2012   BILIRUBINUR NEGATIVE 07/15/2022 2012   KETONESUR NEGATIVE 07/15/2022 2012   PROTEINUR NEGATIVE 07/15/2022 2012   NITRITE NEGATIVE 07/15/2022 2012   LEUKOCYTESUR LARGE (A) 07/15/2022 2012   Sepsis  Labs: @LABRCNTIP (procalcitonin:4,lacticidven:4)  ) Recent Results (from the past 240 hours)  Resp panel by RT-PCR (RSV, Flu A&B, Covid) Anterior Nasal Swab     Status: None   Collection Time: 12/20/23  6:07 PM   Specimen: Anterior Nasal Swab  Result Value Ref Range Status   SARS Coronavirus 2 by RT PCR NEGATIVE NEGATIVE Final   Influenza A by PCR NEGATIVE NEGATIVE Final   Influenza B by PCR NEGATIVE NEGATIVE Final    Comment: (NOTE) The Xpert Xpress SARS-CoV-2/FLU/RSV plus assay is intended as an aid in the diagnosis of influenza from Nasopharyngeal swab specimens and should not be used as a sole basis for treatment. Nasal washings and aspirates are unacceptable for Xpert Xpress SARS-CoV-2/FLU/RSV testing.  Fact Sheet for Patients: BloggerCourse.com  Fact Sheet for Healthcare Providers: SeriousBroker.it  This test is not yet approved or cleared by the [place]  FDA and has been authorized for detection and/or diagnosis of SARS-CoV-2  by FDA under an Emergency Use Authorization (EUA). This EUA will remain in effect (meaning this test can be used) for the duration of the COVID-19 declaration under Section 564(b)(1) of the Act, 21 U.S.C. section 360bbb-3(b)(1), unless the authorization is terminated or revoked.     Resp Syncytial Virus by PCR NEGATIVE NEGATIVE Final    Comment: (NOTE) Fact Sheet for Patients: BloggerCourse.com  Fact Sheet for Healthcare Providers: SeriousBroker.it  This test is not yet approved or cleared by the [place]  FDA and has been authorized for detection and/or diagnosis of SARS-CoV-2 by FDA under an Emergency Use Authorization (EUA). This EUA will remain in effect (meaning this test can be used) for the duration of the COVID-19 declaration under Section 564(b)(1) of the Act, 21 U.S.C. section 360bbb-3(b)(1), unless the authorization is  terminated or revoked.  Performed at [facility], [address] [place], [place] [address]   Blood culture (routine x 2)     Status: Abnormal   Collection Time: 12/20/23  7:05 PM   Specimen: BLOOD RIGHT HAND  Result Value Ref Range Status   Specimen Description BLOOD RIGHT HAND  Final   Special Requests   Final    BOTTLES DRAWN AEROBIC AND ANAEROBIC Blood Culture adequate volume   Culture  Setup Time   Final    GRAM POSITIVE RODS AEROBIC BOTTLE ONLY CRITICAL RESULT CALLED TO, READ BACK BY AND VERIFIED WITH: PHARMD JESSICA MILLEN [id number] BY J RAZZAK, MT    Culture (A)  Final    DIPHTHEROIDS(CORYNEBACTERIUM SPECIES) Standardized susceptibility testing for this organism is not available. Performed at [facility], [address], [place], [place] [address]    Report Status 12/24/2023 FINAL  Final  Blood culture (routine x 2)     Status: Abnormal   Collection Time: 12/20/23  9:23 PM   Specimen: BLOOD LEFT ARM  Result Value Ref Range Status   Specimen Description BLOOD LEFT ARM  Final   Special Requests   Final    BOTTLES DRAWN AEROBIC AND ANAEROBIC Blood Culture results may not be optimal due to an inadequate volume of blood received in culture bottles   Culture  Setup Time   Final    GRAM POSITIVE COCCI IN CLUSTERS ANAEROBIC BOTTLE ONLY CRITICAL RESULT CALLED TO, READ BACK BY AND VERIFIED WITH: PHARMD LISA CURRAN ON 12/21/23 @ 1826 BY DRT    Culture (A)  Final    STAPHYLOCOCCUS EPIDERMIDIS THE SIGNIFICANCE OF ISOLATING THIS ORGANISM FROM A SINGLE SET OF BLOOD CULTURES WHEN MULTIPLE SETS ARE DRAWN IS UNCERTAIN. PLEASE NOTIFY THE MICROBIOLOGY DEPARTMENT WITHIN ONE WEEK IF SPECIATION AND SENSITIVITIES ARE REQUIRED. Performed at [facility], [address], [place], [place] [address]    Report Status 12/23/2023 FINAL  Final  Blood Culture ID Panel (Reflexed)     Status: Abnormal   Collection Time: 12/20/23  9:23 PM  Result Value Ref Range Status    Enterococcus faecalis NOT DETECTED NOT DETECTED Final   Enterococcus Faecium NOT DETECTED NOT DETECTED Final   Listeria monocytogenes NOT DETECTED NOT DETECTED Final   Staphylococcus species DETECTED (A) NOT DETECTED Final    Comment: CRITICAL RESULT CALLED TO, READ BACK BY AND VERIFIED WITH: PHARMD LISA CURRAN ON 12/21/23 @ 1826 BY DRT    Staphylococcus aureus (BCID) NOT DETECTED NOT DETECTED Final   Staphylococcus epidermidis DETECTED (A) NOT DETECTED Final    Comment: Methicillin (oxacillin) resistant coagulase negative staphylococcus. Possible blood culture contaminant (unless isolated  from more than one blood culture draw or clinical case suggests pathogenicity). No antibiotic treatment is indicated for blood  culture contaminants. CRITICAL RESULT CALLED TO, READ BACK BY AND VERIFIED WITH: PHARMD LISA CURRAN ON 12/21/23 @ 1826 BY DRT    Staphylococcus lugdunensis NOT DETECTED NOT DETECTED Final   Streptococcus species NOT DETECTED NOT DETECTED Final   Streptococcus agalactiae NOT DETECTED NOT DETECTED Final   Streptococcus pneumoniae NOT DETECTED NOT DETECTED Final   Streptococcus pyogenes NOT DETECTED NOT DETECTED Final   A.calcoaceticus-baumannii NOT DETECTED NOT DETECTED Final   Bacteroides fragilis NOT DETECTED NOT DETECTED Final   Enterobacterales NOT DETECTED NOT DETECTED Final   Enterobacter cloacae complex NOT DETECTED NOT DETECTED Final   Escherichia coli NOT DETECTED NOT DETECTED Final   Klebsiella aerogenes NOT DETECTED NOT DETECTED Final   Klebsiella oxytoca NOT DETECTED NOT DETECTED Final   Klebsiella pneumoniae NOT DETECTED NOT DETECTED Final   Proteus species NOT DETECTED NOT DETECTED Final   Salmonella species NOT DETECTED NOT DETECTED Final   Serratia marcescens NOT DETECTED NOT DETECTED Final   Haemophilus influenzae NOT DETECTED NOT DETECTED Final   Neisseria meningitidis NOT DETECTED NOT DETECTED Final   Pseudomonas aeruginosa NOT DETECTED NOT DETECTED Final    Stenotrophomonas maltophilia NOT DETECTED NOT DETECTED Final   Candida albicans NOT DETECTED NOT DETECTED Final   Candida auris NOT DETECTED NOT DETECTED Final   Candida glabrata NOT DETECTED NOT DETECTED Final   Candida krusei NOT DETECTED NOT DETECTED Final   Candida parapsilosis NOT DETECTED NOT DETECTED Final   Candida tropicalis NOT DETECTED NOT DETECTED Final   Cryptococcus neoformans/gattii NOT DETECTED NOT DETECTED Final   Methicillin resistance mecA/C DETECTED (A) NOT DETECTED Final    Comment: CRITICAL RESULT CALLED TO, READ BACK BY AND VERIFIED WITH: PHARMD LISA CURRAN ON 12/21/23 @ 1826 BY DRT Performed at [facility], [address] [place], [place] [address]      Radiology Studies: No results found.   Scheduled Meds:  benzonatate  100 mg Oral TID   ferrous sulfate  325 mg Oral QHS   fluticasone  furoate-vilanterol  1 puff Inhalation Daily   furosemide   80 mg Intravenous Q12H   gabapentin   600 mg Oral QHS   Gerhardt's butt cream   Topical BID   insulin  aspart  0-9 Units Subcutaneous TID WC   metoprolol  succinate  25 mg Oral Daily   nystatin   Topical BID   rOPINIRole   4 mg Oral q AM   sodium chloride  flush  3 mL Intravenous Q12H   warfarin  5 mg Oral ONCE-1600   Warfarin - Pharmacist Dosing Inpatient   Does not apply q1600   Continuous Infusions:   LOS: 5 days    Time spent:    Deforest Fast, MD [facility]   12/25/2023, 3:30 PM

## 2023-12-25 NOTE — Progress Notes (Signed)
 Physical Therapy Treatment Patient Details Name: Holly Spence MRN: [id number] DOB: [birth year] Today's Date: 12/25/2023   History of Present Illness Patient is 78 yo female admitted on 12/20/23 for acute hypoxic respiratory failure and acute on chronic diastolic heart failure. PMH significant for T2DM, Afib, HTN, HTN, OSA, PVD, s/p pacemaker placement, GERD, COPD, and LBP.    PT Comments  Pt greeted seated in recliner, requesting to use the bathroom. She performed a chair<>BSC transfer using RW with modA. Pt required increased time to power up into standing and transition hands from armrests to RW. She maintained a fwd flex posture and took short slow steps to turn with PT facilitating her pelvis to pivot. Pt was dependent for pericare in static stance. She c/o dizziness throughout session although BP was stable. Unable to advance gait this session. Will continue to follow acutely and advance appropriately.      If plan is discharge home, recommend the following: A lot of help with bathing/dressing/bathroom;Assistance with cooking/housework;Assist for transportation;A lot of help with walking and/or transfers   Can travel by private vehicle     No  Equipment Recommendations  None recommended by PT    Recommendations for Other Services       Precautions / Restrictions Precautions Precautions: Fall Recall of Precautions/Restrictions: Impaired Restrictions Weight Bearing Restrictions Per Provider Order: No     Mobility  Bed Mobility               General bed mobility comments: Not assessed. Pt greeted in recliner chair and returned there at end of session.    Transfers Overall transfer level: Needs assistance Equipment used: Rolling walker (2 wheels) Transfers: Sit to/from Stand, Bed to chair/wheelchair/BSC Sit to Stand: Min assist   Step pivot transfers: Mod assist, +2 safety/equipment       General transfer comment: Pt stood from recliner chair and BSC. Cues for  sequencing and hand placement using RW. She took increased time to rise and transition hands from armrest to RW. Pt transferred chair<>BSC by taking short slow steps. PT facilitated hips to turn and assisted with manuevering RW in tight environment. Cues for upright posture as pt maintains an excessive fwd lean and PLB technique. Good eccentric control with sitting.    Ambulation/Gait               General Gait Details: Deferred d/t pt's c/o dizziness, which was unrelieved with rest although BP was stable and fatigue.   Stairs             Wheelchair Mobility     Tilt Bed    Modified Rankin (Stroke Patients Only)       Balance Overall balance assessment: Needs assistance Sitting-balance support: Bilateral upper extremity supported, Feet supported Sitting balance-Leahy Scale: Fair Sitting balance - Comments: Pt sat on edge of recliner chair and on BSC with supervision.   Standing balance support: Bilateral upper extremity supported, During functional activity, Reliant on assistive device for balance Standing balance-Leahy Scale: Poor Standing balance comment: Pt dependent on RW for support during transfers and min-modA for safety/stability. Pt maintained static stance while pericare was addressed. She quickly fatigued.                            Communication Communication Communication: No apparent difficulties  Cognition Arousal: Alert Behavior During Therapy: WFL for tasks assessed/performed   PT - Cognitive impairments: No apparent impairments  Following commands: Intact      Cueing Cueing Techniques: Verbal cues, Tactile cues  Exercises      General Comments General comments (skin integrity, edema, etc.): Pt on 2L O2 via [place], SpO2 >88% throughout session. Pt c/o dizziness throughout session although BP stable - 133/60 and 135/62.      Pertinent Vitals/Pain Pain Assessment Pain Assessment: Faces Faces Pain  Scale: Hurts little more Pain Location: Back and Knees Pain Descriptors / Indicators: Sore, Discomfort, Aching Pain Intervention(s): Monitored during session, Repositioned    Home Living                          Prior Function            PT Goals (current goals can now be found in the care plan section) Acute Rehab PT Goals Patient Stated Goal: Go to rehab and be able to move around with less assistance. Progress towards PT goals: Progressing toward goals    Frequency    Min 2X/week      PT Plan      Co-evaluation              AM-PAC PT 6 Clicks Mobility   Outcome Measure  Help needed turning from your back to your side while in a flat bed without using bedrails?: A Lot Help needed moving from lying on your back to sitting on the side of a flat bed without using bedrails?: A Lot Help needed moving to and from a bed to a chair (including a wheelchair)?: A Lot Help needed standing up from a chair using your arms (e.g., wheelchair or bedside chair)?: A Lot Help needed to walk in hospital room?: A Lot Help needed climbing 3-5 steps with a railing? : Total 6 Click Score: 11    End of Session Equipment Utilized During Treatment: Gait belt;Oxygen Activity Tolerance: Patient tolerated treatment well;Treatment limited secondary to medical complications (Comment);Patient limited by fatigue (pt c/o dizziness) Patient left: in chair;with call bell/phone within reach (pt states she will not get up without staff assist and will press call button/wait for assist for back to bed.) Nurse Communication: Mobility status PT Visit Diagnosis: History of falling (Z91.81);Unsteadiness on feet (R26.81);Other abnormalities of gait and mobility (R26.89)     Time: 4782-9562 PT Time Calculation (min) (ACUTE ONLY): 21 min  Charges:    $Therapeutic Activity: 8-22 mins PT General Charges $$ ACUTE PT VISIT: 1 Visit                     Glenford Lanes, PT, DPT Acute Rehabilitation  Services Office: [phone] Secure Chat Preferred  Riva Chester 12/25/2023, 3:12 PM

## 2023-12-25 NOTE — Progress Notes (Signed)
 Mobility Specialist Progress Note:   12/25/23 1130  Mobility  Activity Stood at bedside (STSx2)  Level of Assistance Minimal assist, patient does 75% or more  Assistive Device Front wheel walker  Activity Response Tolerated well  Mobility Referral Yes  Mobility visit 1 Mobility  Mobility Specialist Start Time (ACUTE ONLY) 1140  Mobility Specialist Stop Time (ACUTE ONLY) 1152  Mobility Specialist Time Calculation (min) (ACUTE ONLY) 12 min   Pre Mobility: 124/55 (75) During Mobility: 126/58 (73)  Pt agreeable to mobility session. C/o dizziness throughout, limited session. Able to stand with minA+1 this session, however unable to take steps at this time. VSS throughout. Pt back in chair with all needs met.   Holly Spence Mobility Specialist Please contact via SecureChat or  Rehab office at [phone]

## 2023-12-25 NOTE — [place] FL2 (Signed)
 [place]  MEDICAID FL2 LEVEL OF CARE FORM     IDENTIFICATION  Patient Name: Holly Spence Birthdate: 1946/02/06 Sex: female Admission Date (Current Location): 12/20/2023  [facility] and [place] Number:  Producer, television/film/video and Address:  [facility], [address] [place], [place] [address]      Provider Number: [id number]  Attending Physician Name and Address:  Albertus Alt,*  Relative Name and Phone Number:       Current Level of Care: Hospital Recommended Level of Care: Skilled Nursing Facility Prior Approval Number:    Date Approved/Denied:   PASRR Number: [id number]  Discharge Plan: SNF    Current Diagnoses: Patient Active Problem List   Diagnosis Date Noted   Transaminitis 12/22/2023   Essential hypertension 12/21/2023   Obesity, class 3 12/21/2023   Acute on chronic diastolic CHF (congestive heart failure) (HCC) 12/20/2023   Elevated LFTs 12/20/2023   Lactic acidosis 12/20/2023   Acute renal failure superimposed on stage 3b chronic kidney disease (HCC) 12/20/2023   Thrombocytopenia (HCC) 12/20/2023   Troponin level elevated 12/20/2023   Acute respiratory failure with hypoxia (HCC) 12/20/2023   COPD (chronic obstructive pulmonary disease) (HCC) 12/20/2023   Persistent atrial fibrillation (HCC) 02/19/2023   New onset a-fib (HCC) 01/08/2023   Hypercoagulable state due to persistent atrial fibrillation (HCC) 01/08/2023   Cellulitis of right leg 12/28/2022   Arthritis 11/07/2021   Back pain 11/07/2021   Bruises easily 11/07/2021   Contact lens/glasses fitting 11/07/2021   Cough 11/07/2021   Fatigue 11/07/2021   Poor circulation 11/07/2021   Sinus problem 11/07/2021   Sleep apnea 11/07/2021   Dyspnea and respiratory abnormality 11/07/2021   Symptomatic bradycardia 06/11/2020   Complete heart block (HCC) 06/11/2020   GI bleeding 06/27/2019   Restless leg syndrome 02/08/2019   Duodenal ulcer 02/08/2019   Melena  02/08/2019   Moderate asthma 02/08/2019   Right renal mass 02/08/2019   Ascending aorta dilation (HCC) 12/09/2018   Type 2 diabetes mellitus with hyperlipidemia (HCC) 11/10/2015   Palpitations 11/10/2015   Allergic rhinitis 09/20/2015   Benign essential hypertension 09/20/2015   Gastroesophageal reflux disease 09/20/2015   Hyperlipidemia 09/20/2015   Insulin  long-term use (HCC) 09/20/2015   Uncontrolled type 2 diabetes mellitus without complication, with long-term current use of insulin  09/20/2015   Hx of laparoscopic gastric banding 01/31/2011    Orientation RESPIRATION BLADDER Height & Weight     Self, Time, Situation, Place  O2 (2L O2) Continent, External catheter Weight: 288 lb (130.6 kg) Height:  5' 3 (160 cm)  BEHAVIORAL SYMPTOMS/MOOD NEUROLOGICAL BOWEL NUTRITION STATUS      Continent Diet (see dc summary)  AMBULATORY STATUS COMMUNICATION OF NEEDS Skin   Extensive Assist Verbally PU Stage and Appropriate Care (Pressure Injury Buttocks Right Deep Tissue Pressure Injury - Purple or maroon localized area of discolored intact skin or blood-filled blister due to damage of underlying soft tissue from pressure and/or shear.)                       Personal Care Assistance Level of Assistance  Bathing, Dressing, Feeding Bathing Assistance: Maximum assistance Feeding assistance: Independent Dressing Assistance: Maximum assistance     Functional Limitations Info  Sight, Hearing, Speech Sight Info: Adequate Hearing Info: Adequate Speech Info: Adequate    SPECIAL CARE FACTORS FREQUENCY  PT (By licensed PT), OT (By licensed OT)     PT Frequency: 5x week OT Frequency: 5x week  Contractures Contractures Info: Not present    Additional Factors Info  Code Status, Allergies, Insulin  Sliding Scale, Psychotropic Code Status Info: Full Allergies Info: Penicillins, Sulfa Antibiotics, Erythromycin, Azithromycin, Ciprofloxacin Hcl Psychotropic Info:  gabapentin  Insulin  Sliding Scale Info: see dc summary       Current Medications (12/25/2023):  This is the current hospital active medication list Current Facility-Administered Medications  Medication Dose Route Frequency Provider Last Rate Last Admin   acetaminophen  (TYLENOL ) tablet 650 mg  650 mg Oral Q6H PRN Opyd, Timothy S, MD       Or   acetaminophen  (TYLENOL ) suppository 650 mg  650 mg Rectal Q6H PRN Opyd, Timothy S, MD       albuterol  (PROVENTIL ) (2.5 MG/3ML) 0.083% nebulizer solution 2.5 mg  2.5 mg Nebulization Q2H PRN Arrien, Curlee Doss, MD   2.5 mg at 12/22/23 2230   benzonatate (TESSALON) capsule 100 mg  100 mg Oral TID Arrien, Mauricio Daniel, MD   100 mg at 12/25/23 0815   ferrous sulfate tablet 325 mg  325 mg Oral QHS Arrien, Mauricio Daniel, MD   325 mg at 12/24/23 2038   fluticasone  furoate-vilanterol (BREO ELLIPTA ) 200-25 MCG/ACT 1 puff  1 puff Inhalation Daily Arrien, Mauricio Daniel, MD   1 puff at 12/25/23 0731   furosemide  (LASIX ) injection 80 mg  80 mg Intravenous Q12H Arrien, Mauricio Daniel, MD   80 mg at 12/25/23 0815   gabapentin  (NEURONTIN ) capsule 600 mg  600 mg Oral QHS Arrien, Mauricio Daniel, MD   600 mg at 12/24/23 2038   Gerhardt's butt cream   Topical BID Arrien, Mauricio Daniel, MD   Given at 12/25/23 [phone]   insulin  aspart (novoLOG ) injection 0-9 Units  0-9 Units Subcutaneous TID WC Kakrakandy, Arshad N, MD   3 Units at 12/25/23 1139   metoprolol  succinate (TOPROL -XL) 24 hr tablet 25 mg  25 mg Oral Daily Arrien, Mauricio Daniel, MD   25 mg at 12/25/23 0815   nystatin (MYCOSTATIN/NYSTOP) topical powder   Topical BID Arrien, Curlee Doss, MD   Given at 12/25/23 [phone]   Oral care mouth rinse  15 mL Mouth Rinse PRN Arrien, Curlee Doss, MD       oxyCODONE  (Oxy IR/ROXICODONE ) immediate release tablet 2.5-5 mg  2.5-5 mg Oral Q4H PRN Opyd, Timothy S, MD   5 mg at 12/24/23 2038   rOPINIRole  (REQUIP ) tablet 4 mg  4 mg Oral q AM Arrien, Mauricio Daniel, MD   4  mg at 12/25/23 1191   sodium chloride  flush (NS) 0.9 % injection 3 mL  3 mL Intravenous Q12H Opyd, Timothy S, MD   3 mL at 12/25/23 0816   trimethobenzamide  (TIGAN ) injection 200 mg  200 mg Intramuscular Q6H PRN Opyd, Santana Cue, MD       warfarin (COUMADIN) tablet 5 mg  5 mg Oral ONCE-1600 Albino Alu, [facility]       Warfarin - Pharmacist Dosing Inpatient   Does not apply q1600 Arrien, Mauricio Daniel, MD         Discharge Medications: Please see discharge summary for a list of discharge medications.  Relevant Imaging Results:  Relevant Lab Results:   Additional Information SSN [id number]  Arron Big, LCSWA

## 2023-12-25 NOTE — TOC Progression Note (Signed)
 Transition of Care [facility]) - Progression Note    Patient Details  Name: Holly Spence MRN: [id number] Date of Birth: May 15, 1946  [facility]) CM/SW Contact  Arron Big, [place] Phone Number: 12/25/2023, 2:54 PM  Clinical Narrative:   CSW met patient at bedside and discussed PT recs for SNF STR. Patient is alert and oriented x4 and stated she would like to speak with family regarding her disposition. Patient was agreeable to CSW sending referrals for her at this time so she can review her options.   TOC will continue to follow.    Expected Discharge Plan:  (TBD) Barriers to Discharge: Continued Medical Work up, Other (must enter comment), SNF Pending bed offer (Family to discuss plans for dispostion)  Expected Discharge Plan and Services In-house Referral: Clinical Social Work Discharge Planning Services: CM Consult   Living arrangements for the past 2 months: Single Family Home                                       Social Determinants of Health (SDOH) Interventions SDOH Screenings   Food Insecurity: No Food Insecurity (12/20/2023)  Housing: Unknown (12/20/2023)  Transportation Needs: No Transportation Needs (12/20/2023)  Utilities: Not At Risk (12/20/2023)  Depression (PHQ2-9): Low Risk  (02/03/2019)  Social Connections: Moderately Isolated (12/24/2023)  Tobacco Use: Medium Risk (12/20/2023)    Readmission Risk Interventions    12/21/2023    4:14 PM 12/30/2022    3:47 PM  Readmission Risk Prevention Plan  Transportation Screening Complete Complete  PCP or Specialist Appt within 5-7 Days  Complete  Home Care Screening Complete Complete  Medication Review (RN CM) Complete Complete

## 2023-12-26 DIAGNOSIS — R7401 Elevation of levels of liver transaminase levels: Secondary | ICD-10-CM | POA: Diagnosis not present

## 2023-12-26 DIAGNOSIS — I5033 Acute on chronic diastolic (congestive) heart failure: Secondary | ICD-10-CM | POA: Diagnosis not present

## 2023-12-26 DIAGNOSIS — I4819 Other persistent atrial fibrillation: Secondary | ICD-10-CM | POA: Diagnosis not present

## 2023-12-26 DIAGNOSIS — N179 Acute kidney failure, unspecified: Secondary | ICD-10-CM | POA: Diagnosis not present

## 2023-12-26 LAB — GLUCOSE, CAPILLARY
Glucose-Capillary: 104 mg/dL — ABNORMAL HIGH (ref 70–99)
Glucose-Capillary: 119 mg/dL — ABNORMAL HIGH (ref 70–99)
Glucose-Capillary: 148 mg/dL — ABNORMAL HIGH (ref 70–99)
Glucose-Capillary: 180 mg/dL — ABNORMAL HIGH (ref 70–99)
Glucose-Capillary: 193 mg/dL — ABNORMAL HIGH (ref 70–99)

## 2023-12-26 LAB — CBC
HCT: 41.3 % (ref 36.0–46.0)
Hemoglobin: 12.1 g/dL (ref 12.0–15.0)
MCH: 26.9 pg (ref 26.0–34.0)
MCHC: 29.3 g/dL — ABNORMAL LOW (ref 30.0–36.0)
MCV: 92 fL (ref 80.0–100.0)
Platelets: 59 10*3/uL — ABNORMAL LOW (ref 150–400)
RBC: 4.49 MIL/uL (ref 3.87–5.11)
RDW: 19.4 % — ABNORMAL HIGH (ref 11.5–15.5)
WBC: 4.5 10*3/uL (ref 4.0–10.5)
nRBC: 0 % (ref 0.0–0.2)

## 2023-12-26 LAB — COMPREHENSIVE METABOLIC PANEL WITH GFR
ALT: 134 U/L — ABNORMAL HIGH (ref 0–44)
AST: 26 U/L (ref 15–41)
Albumin: 2.5 g/dL — ABNORMAL LOW (ref 3.5–5.0)
Alkaline Phosphatase: 53 U/L (ref 38–126)
Anion gap: 11 (ref 5–15)
BUN: 46 mg/dL — ABNORMAL HIGH (ref 8–23)
CO2: 41 mmol/L — ABNORMAL HIGH (ref 22–32)
Calcium: 8.4 mg/dL — ABNORMAL LOW (ref 8.9–10.3)
Chloride: 89 mmol/L — ABNORMAL LOW (ref 98–111)
Creatinine, Ser: 1.94 mg/dL — ABNORMAL HIGH (ref 0.44–1.00)
GFR, Estimated: 26 mL/min — ABNORMAL LOW (ref >60)
Glucose, Bld: 128 mg/dL — ABNORMAL HIGH (ref 70–99)
Potassium: 3.5 mmol/L (ref 3.5–5.1)
Sodium: 141 mmol/L (ref 135–145)
Total Bilirubin: 1.3 mg/dL — ABNORMAL HIGH (ref 0.0–1.2)
Total Protein: 4.8 g/dL — ABNORMAL LOW (ref 6.5–8.1)

## 2023-12-26 LAB — PROTIME-INR
INR: 1.4 — ABNORMAL HIGH (ref 0.8–1.2)
Prothrombin Time: 17 s — ABNORMAL HIGH (ref 11.4–15.2)

## 2023-12-26 LAB — MAGNESIUM: Magnesium: 1.8 mg/dL (ref 1.7–2.4)

## 2023-12-26 MED ORDER — GABAPENTIN 300 MG PO CAPS
300.0000 mg | ORAL_CAPSULE | Freq: Every day | ORAL | Status: DC
  Administered 2023-12-26 – 2023-12-28 (×3): 300 mg via ORAL
  Filled 2023-12-26 (×3): qty 1

## 2023-12-26 MED ORDER — ROPINIROLE HCL 1 MG PO TABS
2.0000 mg | ORAL_TABLET | Freq: Every morning | ORAL | Status: DC
  Administered 2023-12-27 – 2023-12-29 (×3): 2 mg via ORAL
  Filled 2023-12-26 (×3): qty 2

## 2023-12-26 MED ORDER — STERILE WATER FOR INJECTION IJ SOLN
INTRAMUSCULAR | Status: AC
  Administered 2023-12-26: 10 mL
  Filled 2023-12-26: qty 10

## 2023-12-26 MED ORDER — MAGNESIUM SULFATE 2 GM/50ML IV SOLN
2.0000 g | Freq: Once | INTRAVENOUS | Status: AC
  Administered 2023-12-26: 2 g via INTRAVENOUS
  Filled 2023-12-26: qty 50

## 2023-12-26 MED ORDER — ACETAZOLAMIDE SODIUM 500 MG IJ SOLR
500.0000 mg | Freq: Once | INTRAMUSCULAR | Status: AC
  Administered 2023-12-26: 500 mg via INTRAVENOUS
  Filled 2023-12-26: qty 500

## 2023-12-26 MED ORDER — POTASSIUM CHLORIDE CRYS ER 20 MEQ PO TBCR
40.0000 meq | EXTENDED_RELEASE_TABLET | Freq: Once | ORAL | Status: AC
  Administered 2023-12-26: 40 meq via ORAL
  Filled 2023-12-26: qty 2

## 2023-12-26 MED ORDER — WARFARIN SODIUM 7.5 MG PO TABS
7.5000 mg | ORAL_TABLET | Freq: Once | ORAL | Status: AC
  Administered 2023-12-26: 7.5 mg via ORAL
  Filled 2023-12-26: qty 1

## 2023-12-26 NOTE — Plan of Care (Signed)

## 2023-12-26 NOTE — Progress Notes (Signed)
 Mobility Specialist Progress Note:   12/26/23 1500  Mobility  Activity Ambulated with assistance in hallway;Transferred to/from [facility]  Level of Assistance Moderate assist, patient does 50-74% (+2 (chair follow))  Assistive Device Four wheel walker;BSC  Distance Ambulated (ft) 10 ft  Activity Response Tolerated well  Mobility Referral Yes  Mobility visit 1 Mobility  Mobility Specialist Start Time (ACUTE ONLY) 1500  Mobility Specialist Stop Time (ACUTE ONLY) 1530  Mobility Specialist Time Calculation (min) (ACUTE ONLY) 30 min   Pt agreeable to mobility session. Required modA to stand from chair, and minA to ambulate short hallway distance. Pt limited by BM urgency at this time. Able to stand and pivot to BSC, BM successful. Able to pivot back to chair, left with all needs met. VSS on 3LO2 throughout.  Oneda Big Mobility Specialist Please contact via SecureChat or  Rehab office at [phone]

## 2023-12-26 NOTE — Progress Notes (Signed)
 Rounding Note   Patient Name: Holly Spence Date of Encounter: 12/26/2023  [facility] Cardiologist: Nelia Balzarine, MD   Subjective Net -3.3L yesterday, -10.8 L on admission. Creatinine improving (3.0 >2.80 > 2.73 > 2.65 >2.36>2.19>1.94).  Liver enzymes continue to improve.  BP 107/40.  She reports dyspnea improving but remains short of breath   Scheduled Meds:  benzonatate  100 mg Oral TID   ferrous sulfate  325 mg Oral QHS   fluticasone  furoate-vilanterol  1 puff Inhalation Daily   furosemide   80 mg Intravenous Q12H   gabapentin   300 mg Oral QHS   Gerhardt's butt cream   Topical BID   insulin  aspart  0-9 Units Subcutaneous TID WC   metoprolol  succinate  25 mg Oral Daily   nystatin   Topical BID   [START ON 12/27/2023] rOPINIRole   2 mg Oral q AM   sodium chloride  flush  3 mL Intravenous Q12H   warfarin  7.5 mg Oral ONCE-1600   Warfarin - Pharmacist Dosing Inpatient   Does not apply q1600   Continuous Infusions:  PRN Meds: acetaminophen  **OR** acetaminophen , albuterol , mouth rinse, oxyCODONE , trimethobenzamide    Vital Signs  Vitals:   12/25/23 2356 12/26/23 0355 12/26/23 0734 12/26/23 0750  BP: (!) 111/52 105/73 (!) 107/40   Pulse: 61 60 63   Resp: 17 (!) 21 20   Temp: (!) 97.5 F (36.4 C) (!) 97.5 F (36.4 C) 98 F (36.7 C)   TempSrc: Oral Oral Oral   SpO2: 96% 96% 95% 94%  Weight: 135.6 kg     Height:        Intake/Output Summary (Last 24 hours) at 12/26/2023 0915 Last data filed at 12/26/2023 0356 Gross per 24 hour  Intake 740 ml  Output 4000 ml  Net -3260 ml      12/25/2023   11:56 PM 12/25/2023    5:43 AM 12/24/2023    2:00 PM  Last 3 Weights  Weight (lbs) 299 lb 288 lb 301 lb 2.4 oz  Weight (kg) 135.626 kg 130.636 kg 136.6 kg      Telemetry V paced- Personally Reviewed  ECG  No new ECG- Personally Reviewed  Physical Exam  GEN: No acute distress.   Neck: Difficult to assess given habitus Cardiac: RRR, no murmurs, rubs, or  gallops.  Respiratory: Scattered rhonchi GI: Soft, nontender, non-distended  MS: 1+  edema Neuro:  Nonfocal  Psych: Normal affect   Labs High Sensitivity Troponin:   Recent Labs  Lab 12/20/23 1815 12/20/23 2016 12/21/23 1007  TROPONINIHS 410* 425* 442*     Chemistry Recent Labs  Lab 12/24/23 0330 12/25/23 0324 12/26/23 0249  NA 138 139 141  K 4.3 3.9 3.5  CL 94* 92* 89*  CO2 37* 40* 41*  GLUCOSE 125* 104* 128*  BUN 52* 51* 46*  CREATININE 2.36* 2.19* 1.94*  CALCIUM  8.2* 8.2* 8.4*  MG 2.2 2.0 1.8  PROT 5.5* 5.2* 4.8*  ALBUMIN 3.0* 2.7* 2.5*  AST 67* 38 26  ALT 313* 201* 134*  ALKPHOS 83 64 53  BILITOT 1.2 1.3* 1.3*  GFRNONAA 20* 22* 26*  ANIONGAP 7 7 11     Lipids No results for input(s): CHOL, TRIG, HDL, LABVLDL, LDLCALC, CHOLHDL in the last 168 hours.  Hematology Recent Labs  Lab 12/23/23 0313 12/25/23 0751 12/26/23 0249  WBC 5.5 5.4 4.5  RBC 4.53 4.81 4.49  HGB 12.1 12.9 12.1  HCT 41.9 45.0 41.3  MCV 92.5 93.6 92.0  MCH 26.7  26.8 26.9  MCHC 28.9* 28.7* 29.3*  RDW 20.3* 19.7* 19.4*  PLT 79* 59* 59*   Thyroid  No results for input(s): TSH, FREET4 in the last 168 hours.  BNP Recent Labs  Lab 12/20/23 1821  BNP 1,019.0*    DDimer No results for input(s): DDIMER in the last 168 hours.   Radiology  No results found.   Cardiac Studies   Patient Profile   78 y.o. female with history of atrial fibrillation, T2DM, COPD, CKD, diastolic heart failure who presents with acute hypoxic respiratory failure  Assessment & Plan  Acute on chronic diastolic heart failure: Echocardiogram with very limited views but grossly normal LV systolic function.  Difficult to assess volume status given body habitus but appears hypervolemic.  Renal and liver function continue to improve with diuresis -Continue IV lasix .  Worsening alkalosis, will give dose of diamox today -Strict I's/O's and daily weights  Elevated troponin: 410 > 425 > 442.  Flat  troponin, not consistent with acute coronary syndrome.  Suspect demand ischemia in setting of decompensated heart failure.  Persistent Afib: Currently in atrial sensed, Vpaced rhythm.  Continue Eliquis , metoprolol .  Holding amiodarone  given transaminitis, will plan to restart as transaminitis resolves  AKI on CKD: Creatinine 3.0 on admission.  Likely cardiorenal syndrome, has been improving with diuresis, creatinine 1.94 today  Morbid obesity: Body mass index is 52.97 kg/m.  Transaminitis: Suspect congestive hepatopathy, improving with diuresis.  Amiodarone  held given transaminitis, will plan to restart prior to discharge as transaminitis resolves   For questions or updates, please contact [facility] Please consult [email or web address] for contact info under     Signed, Wendie Hamburg, MD  12/26/2023, 9:15 AM

## 2023-12-26 NOTE — Plan of Care (Signed)
  Problem: Tissue Perfusion: Goal: Adequacy of tissue perfusion will improve Outcome: Progressing   Problem: Clinical Measurements: Goal: Will remain free from infection Outcome: Progressing Goal: Respiratory complications will improve Outcome: Progressing Goal: Cardiovascular complication will be avoided Outcome: Progressing   Problem: Elimination: Goal: Will not experience complications related to urinary retention Outcome: Progressing   Problem: Safety: Goal: Ability to remain free from injury will improve Outcome: Progressing

## 2023-12-26 NOTE — TOC Progression Note (Signed)
 Transition of Care [facility]) - Progression Note    Patient Details  Name: Holly Spence MRN: [id number] Date of Birth: 04-Jan-1946  [facility]) CM/SW Contact  Arron Big, [place] Phone Number: 12/26/2023, 11:30 AM  Clinical Narrative:   CSW provided patient with medicare.gov bed offers for SNF STR. Patient stated she wants to discuss decision with her family before deciding. CSW will follow up.   TOC will continue to follow.    Expected Discharge Plan:  (TBD) Barriers to Discharge: Continued Medical Work up, Other (must enter comment), SNF Pending bed offer (Family to discuss plans for dispostion)  Expected Discharge Plan and Services In-house Referral: Clinical Social Work Discharge Planning Services: CM Consult   Living arrangements for the past 2 months: Single Family Home                                       Social Determinants of Health (SDOH) Interventions SDOH Screenings   Food Insecurity: No Food Insecurity (12/20/2023)  Housing: Unknown (12/20/2023)  Transportation Needs: No Transportation Needs (12/20/2023)  Utilities: Not At Risk (12/20/2023)  Depression (PHQ2-9): Low Risk  (02/03/2019)  Social Connections: Moderately Isolated (12/24/2023)  Tobacco Use: Medium Risk (12/20/2023)    Readmission Risk Interventions    12/21/2023    4:14 PM 12/30/2022    3:47 PM  Readmission Risk Prevention Plan  Transportation Screening Complete Complete  PCP or Specialist Appt within 5-7 Days  Complete  Home Care Screening Complete Complete  Medication Review (RN CM) Complete Complete

## 2023-12-26 NOTE — Progress Notes (Signed)
 PHARMACY - ANTICOAGULATION CONSULT NOTE  Pharmacy Consult for Warfarin Indication: atrial fibrillation  Allergies  Allergen Reactions   Penicillins Hives and Other (See Comments)    At injection site only- hives   Sulfa Antibiotics Hives   Erythromycin Other (See Comments)    Unsure    Azithromycin Rash and Other (See Comments)    Broke out the legs   Ciprofloxacin Hcl Other (See Comments)    Abdominal Pain    Patient Measurements: Height: 5' 3 (160 cm) Weight: 135.6 kg (299 lb) IBW/kg (Calculated) : 52.4 HEPARIN  DW (KG): 89  Vital Signs: Temp: 97.5 F (36.4 C) (06/18 0355) Temp Source: Oral (06/18 0355) BP: 105/73 (06/18 0355) Pulse Rate: 60 (06/18 0355)  Labs: Recent Labs    12/24/23 0330 12/25/23 0324 12/25/23 0751 12/26/23 0249  HGB  --   --  12.9 12.1  HCT  --   --  45.0 41.3  PLT  --   --  59* 59*  LABPROT  --   --  17.3* 17.0*  INR  --   --  1.4* 1.4*  CREATININE 2.36* 2.19*  --  1.94*    Estimated Creatinine Clearance: 31.3 mL/min (A) (by C-G formula based on SCr of 1.94 mg/dL (H)).   Medical History: Past Medical History:  Diagnosis Date   Allergic rhinitis 09/20/2015   Arthritis    Ascending aorta dilation (HCC) 12/09/2018   Back pain    Benign essential hypertension 09/20/2015   Bruises easily    Complete heart block (HCC) 06/11/2020   Contact lens/glasses fitting    Cough    Depression 02/08/2019   Diabetes mellitus due to underlying condition with unspecified complications (HCC) 11/10/2015   Duodenal ulcer 02/08/2019   Fatigue    Gastroesophageal reflux disease 09/20/2015   GI bleeding 06/27/2019   Hx of laparoscopic gastric banding 01/31/2011   Surgery: 03/22/10    Hyperlipidemia    Insulin  long-term use (HCC) 09/20/2015   Melena 02/08/2019   Moderate asthma 02/08/2019   Palpitations 11/10/2015   Poor circulation    Right renal mass 02/08/2019   Sinus problem    Sleep apnea    SOB (shortness of breath)    Symptomatic bradycardia 06/11/2020    Uncontrolled type 2 diabetes mellitus without complication, with long-term current use of insulin  09/20/2015   Assessment: Carrianne Hyun is a 78 year old female with PMH DM2, HLD, OSA, and PAF. She is being switched from Eliquis  to warfarin as the result of cost issues. Her CBC is stable. Her last dose of Eliquis  2.5 mg was 12/24/23 AM. Given CHADSVAS 6, no recent cardioversion, and no recent ablaitons, patient will not be bridged.  INR is still subtherapeutic at 1.4 after warfarin 5 mg x1. CBC is stable today and no signs of bleeding. Patient's diet is unchanged and she is eating well. Of note, ropinirole  has been reduced from 4 mg to 2 mg daily, which could reduce INR.  Goal of Therapy:  INR 2-3 Monitor platelets by anticoagulation protocol: Yes   Plan:  Warfarin 7.5 mg x1  Monitor INR, CBC, and signs of bleeding Monitor diet and DDI with concomitant therapies  Albino Alu, PharmD PGY2 Cardiology Pharmacy Resident 12/26/2023,6:32 AM

## 2023-12-26 NOTE — Progress Notes (Signed)
 Occupational Therapy Treatment Patient Details Name: CALIANN LECKRONE MRN: [id number] DOB: 1945/08/02 Today's Date: 12/26/2023   History of present illness Patient is 78 yo female admitted on 12/20/23 for acute hypoxic respiratory failure and acute on chronic diastolic heart failure. PMH significant for T2DM, Afib, HTN, HTN, OSA, PVD, s/p pacemaker placement, GERD, COPD, and LBP.   OT comments  Patient received in supine and eager to get OOB. Patient instructed on bed mobility with patient assisting with BLEs and requiring assistance with trunk and scooting to EOB. Patient instructed on sit to stand and hand placement with mod assist to stand and max assist for step pivot transfer with RW. Patient demonstrating fear of falling during transfers.  Grooming and gown change performed while seated in recliner. Patient will benefit from continued inpatient follow up therapy, <3 hours/day. Acute OT to continue to follow to address established goals to facilitate DC to next venue of care.        If plan is discharge home, recommend the following:  A lot of help with walking and/or transfers;A lot of help with bathing/dressing/bathroom;Assist for transportation;Assistance with cooking/housework;Help with stairs or ramp for entrance   Equipment Recommendations  Other (comment) (TBD next venue of care)    Recommendations for Other Services      Precautions / Restrictions Precautions Precautions: Fall Recall of Precautions/Restrictions: Impaired Restrictions Weight Bearing Restrictions Per Provider Order: No       Mobility Bed Mobility Overal bed mobility: Needs Assistance Bed Mobility: Supine to Sit     Supine to sit: Used rails, HOB elevated, Mod assist     General bed mobility comments: able to assist with moving legs to EOB and assistance needed with trunk and scooting to EOB    Transfers Overall transfer level: Needs assistance Equipment used: Rolling walker (2 wheels) Transfers: Sit  to/from Stand, Bed to chair/wheelchair/BSC Sit to Stand: Mod assist     Step pivot transfers: Max assist     General transfer comment: cues for hand placement and safety, patient fearful of falling during transfer     Balance Overall balance assessment: Needs assistance Sitting-balance support: Bilateral upper extremity supported, Feet supported Sitting balance-Leahy Scale: Fair Sitting balance - Comments: EOB with discomfort due to bed rail   Standing balance support: Bilateral upper extremity supported, During functional activity, Reliant on assistive device for balance Standing balance-Leahy Scale: Poor Standing balance comment: reliant on RW for support                           ADL either performed or assessed with clinical judgement   ADL Overall ADL's : Needs assistance/impaired     Grooming: Wash/dry hands;Wash/dry face;Brushing hair;Supervision/safety;Sitting Grooming Details (indicate cue type and reason): in recliner         Upper Body Dressing : Minimal assistance;Sitting Upper Body Dressing Details (indicate cue type and reason): to change gown Lower Body Dressing: Total assistance;Sit to/from stand Lower Body Dressing Details (indicate cue type and reason): for socks Toilet Transfer: Maximal assistance;Rolling walker (2 wheels) Toilet Transfer Details (indicate cue type and reason): simulated         Functional mobility during ADLs: Maximal assistance;+2 for safety/equipment;+2 for physical assistance      Extremity/Trunk Assessment              Vision       Perception     Praxis     Communication Communication Communication: No apparent difficulties  Cognition Arousal: Alert Behavior During Therapy: WFL for tasks assessed/performed                                 Following commands: Intact        Cueing   Cueing Techniques: Verbal cues, Tactile cues  Exercises      Shoulder Instructions        General Comments 2 liters O2 with 94% SpO2    Pertinent Vitals/ Pain       Pain Assessment Pain Assessment: Faces Faces Pain Scale: Hurts little more Pain Location: Back and Knees Pain Descriptors / Indicators: Grimacing, Guarding Pain Intervention(s): Limited activity within patient's tolerance, Monitored during session, Repositioned  Home Living                                          Prior Functioning/Environment              Frequency  Min 1X/week        Progress Toward Goals  OT Goals(current goals can now be found in the care plan section)  Progress towards OT goals: Progressing toward goals  Acute Rehab OT Goals Patient Stated Goal: to get more rehab OT Goal Formulation: With patient Time For Goal Achievement: 01/05/24 Potential to Achieve Goals: Good ADL Goals Pt Will Perform Lower Body Bathing: with mod assist;sit to/from stand;with adaptive equipment Pt Will Perform Lower Body Dressing: with mod assist;sit to/from stand;with adaptive equipment Pt Will Transfer to Toilet: with mod assist;stand pivot transfer;bedside commode Pt Will Perform Toileting - Clothing Manipulation and hygiene: with max assist;sit to/from stand;with adaptive equipment Additional ADL Goal #1: Pt will transfer from supine to sit EOB with mod assist and HOB elevated at 25 degrees, in preparation for selfcare tasks.  Plan      Co-evaluation                 AM-PAC OT 6 Clicks Daily Activity     Outcome Measure   Help from another person eating meals?: None Help from another person taking care of personal grooming?: A Little Help from another person toileting, which includes using toliet, bedpan, or urinal?: Total Help from another person bathing (including washing, rinsing, drying)?: Total Help from another person to put on and taking off regular upper body clothing?: A Lot Help from another person to put on and taking off regular lower body clothing?:  Total 6 Click Score: 12    End of Session Equipment Utilized During Treatment: Rolling walker (2 wheels);Oxygen  OT Visit Diagnosis: Unsteadiness on feet (R26.81);Other abnormalities of gait and mobility (R26.89);Repeated falls (R29.6);Muscle weakness (generalized) (M62.81)   Activity Tolerance Patient tolerated treatment well   Patient Left in chair;with call bell/phone within reach   Nurse Communication Mobility status        Time: 1610-9604 OT Time Calculation (min): 25 min  Charges: OT General Charges $OT Visit: 1 Visit OT Treatments $Self Care/Home Management : 8-22 mins $Therapeutic Activity: 8-22 mins  Anitra Barn, OTA Acute Rehabilitation Services  Office [phone]   Jovita Nipper 12/26/2023, 1:52 PM

## 2023-12-27 DIAGNOSIS — N179 Acute kidney failure, unspecified: Secondary | ICD-10-CM | POA: Diagnosis not present

## 2023-12-27 DIAGNOSIS — I5033 Acute on chronic diastolic (congestive) heart failure: Secondary | ICD-10-CM | POA: Diagnosis not present

## 2023-12-27 DIAGNOSIS — I4819 Other persistent atrial fibrillation: Secondary | ICD-10-CM | POA: Diagnosis not present

## 2023-12-27 DIAGNOSIS — N1832 Chronic kidney disease, stage 3b: Secondary | ICD-10-CM | POA: Diagnosis not present

## 2023-12-27 LAB — COMPREHENSIVE METABOLIC PANEL WITH GFR
ALT: 101 U/L — ABNORMAL HIGH (ref 0–44)
AST: 22 U/L (ref 15–41)
Albumin: 2.6 g/dL — ABNORMAL LOW (ref 3.5–5.0)
Alkaline Phosphatase: 58 U/L (ref 38–126)
Anion gap: 9 (ref 5–15)
BUN: 44 mg/dL — ABNORMAL HIGH (ref 8–23)
CO2: 43 mmol/L — ABNORMAL HIGH (ref 22–32)
Calcium: 8.7 mg/dL — ABNORMAL LOW (ref 8.9–10.3)
Chloride: 90 mmol/L — ABNORMAL LOW (ref 98–111)
Creatinine, Ser: 1.79 mg/dL — ABNORMAL HIGH (ref 0.44–1.00)
GFR, Estimated: 28 mL/min — ABNORMAL LOW (ref >60)
Glucose, Bld: 142 mg/dL — ABNORMAL HIGH (ref 70–99)
Potassium: 3.6 mmol/L (ref 3.5–5.1)
Sodium: 142 mmol/L (ref 135–145)
Total Bilirubin: 1.1 mg/dL (ref 0.0–1.2)
Total Protein: 5.1 g/dL — ABNORMAL LOW (ref 6.5–8.1)

## 2023-12-27 LAB — CBC
HCT: 42.6 % (ref 36.0–46.0)
Hemoglobin: 12.3 g/dL (ref 12.0–15.0)
MCH: 26.6 pg (ref 26.0–34.0)
MCHC: 28.9 g/dL — ABNORMAL LOW (ref 30.0–36.0)
MCV: 92.2 fL (ref 80.0–100.0)
Platelets: 67 10*3/uL — ABNORMAL LOW (ref 150–400)
RBC: 4.62 MIL/uL (ref 3.87–5.11)
RDW: 19.6 % — ABNORMAL HIGH (ref 11.5–15.5)
WBC: 4.5 10*3/uL (ref 4.0–10.5)
nRBC: 0 % (ref 0.0–0.2)

## 2023-12-27 LAB — GLUCOSE, CAPILLARY
Glucose-Capillary: 107 mg/dL — ABNORMAL HIGH (ref 70–99)
Glucose-Capillary: 220 mg/dL — ABNORMAL HIGH (ref 70–99)
Glucose-Capillary: 188 mg/dL — ABNORMAL HIGH (ref 70–99)
Glucose-Capillary: 244 mg/dL — ABNORMAL HIGH (ref 70–99)

## 2023-12-27 LAB — MAGNESIUM: Magnesium: 2 mg/dL (ref 1.7–2.4)

## 2023-12-27 LAB — PROTIME-INR
INR: 1.7 — ABNORMAL HIGH (ref 0.8–1.2)
Prothrombin Time: 19.8 s — ABNORMAL HIGH (ref 11.4–15.2)

## 2023-12-27 MED ORDER — ACETAZOLAMIDE SODIUM 500 MG IJ SOLR
500.0000 mg | Freq: Two times a day (BID) | INTRAMUSCULAR | Status: DC
  Filled 2023-12-27: qty 500

## 2023-12-27 MED ORDER — AMIODARONE HCL 200 MG PO TABS
200.0000 mg | ORAL_TABLET | Freq: Every day | ORAL | Status: DC
  Administered 2023-12-27 – 2023-12-29 (×3): 200 mg via ORAL
  Filled 2023-12-27 (×3): qty 1

## 2023-12-27 MED ORDER — ACETAZOLAMIDE 250 MG PO TABS
500.0000 mg | ORAL_TABLET | Freq: Two times a day (BID) | ORAL | Status: AC
  Administered 2023-12-27 (×2): 500 mg via ORAL
  Filled 2023-12-27 (×2): qty 2

## 2023-12-27 MED ORDER — WARFARIN SODIUM 3 MG PO TABS
6.0000 mg | ORAL_TABLET | Freq: Once | ORAL | Status: AC
  Administered 2023-12-27: 6 mg via ORAL
  Filled 2023-12-27: qty 2

## 2023-12-27 MED ORDER — WARFARIN SODIUM 7.5 MG PO TABS: 7.5000 mg | ORAL_TABLET | Freq: Once | ORAL | Status: DC

## 2023-12-27 MED ORDER — STERILE WATER FOR INJECTION IJ SOLN
INTRAMUSCULAR | Status: AC
  Filled 2023-12-27: qty 10

## 2023-12-27 MED ORDER — TORSEMIDE 20 MG PO TABS
60.0000 mg | ORAL_TABLET | Freq: Every day | ORAL | Status: DC
  Administered 2023-12-28 – 2023-12-29 (×2): 60 mg via ORAL
  Filled 2023-12-27 (×3): qty 3

## 2023-12-27 MED ORDER — POTASSIUM CHLORIDE CRYS ER 20 MEQ PO TBCR
40.0000 meq | EXTENDED_RELEASE_TABLET | Freq: Once | ORAL | Status: AC
  Administered 2023-12-27: 40 meq via ORAL
  Filled 2023-12-27: qty 2

## 2023-12-27 NOTE — Progress Notes (Signed)
 Physical Therapy Treatment Patient Details Name: Holly Spence MRN: [id number] DOB: [birth year] Today's Date: 12/27/2023   History of Present Illness Patient is 78 yo female admitted on 12/20/23 for acute hypoxic respiratory failure and acute on chronic diastolic heart failure. PMH significant for T2DM, Afib, HTN, HTN, OSA, PVD, s/p pacemaker placement, GERD, COPD, and LBP.    PT Comments  Pt greeted supine in bed, pleasant and agreeable to PT session. She engaged in gait training using RW. Pt ambulated ~69ft twice with minA and a chair follow. She required a prolonged seated rest break between bouts. She c/o dizziness, but BP was stable. Cued PLB technique throughout with SpO2 >88% on 2L. Patient will benefit from continued inpatient follow up therapy, <3 hours/day.    If plan is discharge home, recommend the following: A lot of help with bathing/dressing/bathroom;Assistance with cooking/housework;Assist for transportation;A lot of help with walking and/or transfers   Can travel by private vehicle     No  Equipment Recommendations  None recommended by PT    Recommendations for Other Services       Precautions / Restrictions Precautions Precautions: Fall Recall of Precautions/Restrictions: Impaired Restrictions Weight Bearing Restrictions Per Provider Order: No     Mobility  Bed Mobility Overal bed mobility: Needs Assistance Bed Mobility: Supine to Sit     Supine to sit: Mod assist, HOB elevated, Used rails     General bed mobility comments: Pt sat up on R side of bed with increased time. She brought BLE off EOB. Assit to elevate trunk and scoot fwd with use of bed pad.    Transfers Overall transfer level: Needs assistance Equipment used: Rolling walker (2 wheels) Transfers: Sit to/from Stand Sit to Stand: Min assist, +2 physical assistance           General transfer comment: Cues for hand placement. Used momentum to help pt power up. She stood 3 times with minA x2  to power up. Good eccentric control with sitting.    Ambulation/Gait Ambulation/Gait assistance: Min assist, +2 safety/equipment (chair follow) Gait Distance (Feet): 10 Feet (x2) Assistive device: Rolling walker (2 wheels) Gait Pattern/deviations: Step-to pattern, Decreased step length - right, Decreased step length - left, Decreased stride length, Shuffle, Trunk flexed Gait velocity: decr     General Gait Details: Pt ambulated with short slow steps. Cues for proximity of RW, upright posture, and PLB technique. Pt fatigued and required prolonged seated rest break between bouts.   Stairs             Wheelchair Mobility     Tilt Bed    Modified Rankin (Stroke Patients Only)       Balance Overall balance assessment: Needs assistance Sitting-balance support: Bilateral upper extremity supported, Feet supported Sitting balance-Leahy Scale: Fair Sitting balance - Comments: Pt sat EOB with supervision Postural control: Left lateral lean (Pt reports this is her baseline d/t body habitus) Standing balance support: Bilateral upper extremity supported, During functional activity, Reliant on assistive device for balance Standing balance-Leahy Scale: Poor Standing balance comment: Pt dependent on RW and minA for stability                            Communication Communication Communication: No apparent difficulties  Cognition Arousal: Alert Behavior During Therapy: Anxious   PT - Cognitive impairments: No apparent impairments  PT - Cognition Comments: Pt very nervous about falling and cautious to perform mobility. Requires moderate encouragement. Following commands: Intact      Cueing Cueing Techniques: Verbal cues, Tactile cues  Exercises      General Comments General comments (skin integrity, edema, etc.): Pt c/o dizziness although BP stable. SpO2 >88% on 2L.      Pertinent Vitals/Pain Pain Assessment Pain Assessment:  Faces Faces Pain Scale: Hurts even more Pain Location: Back and Knees Pain Descriptors / Indicators: Discomfort, Aching Pain Intervention(s): Monitored during session, Repositioned    Home Living                          Prior Function            PT Goals (current goals can now be found in the care plan section) Acute Rehab PT Goals Patient Stated Goal: Go to rehab. Progress towards PT goals: Progressing toward goals    Frequency    Min 2X/week      PT Plan      Co-evaluation              AM-PAC PT 6 Clicks Mobility   Outcome Measure  Help needed turning from your back to your side while in a flat bed without using bedrails?: A Lot Help needed moving from lying on your back to sitting on the side of a flat bed without using bedrails?: A Lot Help needed moving to and from a bed to a chair (including a wheelchair)?: Total Help needed standing up from a chair using your arms (e.g., wheelchair or bedside chair)?: Total Help needed to walk in hospital room?: Total Help needed climbing 3-5 steps with a railing? : Total 6 Click Score: 8    End of Session Equipment Utilized During Treatment: Gait belt;Oxygen Activity Tolerance: Patient tolerated treatment well;Patient limited by fatigue Patient left: in chair;with call bell/phone within reach;with chair alarm set Nurse Communication: Mobility status;Other (comment) (purewick removed and encouraging transfers to [facility]) PT Visit Diagnosis: History of falling (Z91.81);Unsteadiness on feet (R26.81);Other abnormalities of gait and mobility (R26.89)     Time: 1610-9604 PT Time Calculation (min) (ACUTE ONLY): 28 min  Charges:    $Gait Training: 23-37 mins PT General Charges $$ ACUTE PT VISIT: 1 Visit                     Glenford Lanes, PT, DPT Acute Rehabilitation Services Office: [phone] Secure Chat Preferred  Riva Chester 12/27/2023, 11:20 AM

## 2023-12-27 NOTE — Progress Notes (Addendum)
 PROGRESS NOTE    Holly Spence  ZOX:[id number] DOB: Nov 16, 1945 DOA: 12/20/2023 PCP: [facility]   78/F w history of atrial fibrillation, T2DM, COPD,CKD and diastolic CHF presented with dyspnea x 3 wks associated with lower extremity edema, and non productive cough.  In ED, + edema,  bun 34 cr 3,0 , AST 1,744 ALT 844 , BNP 1,019 , troponin 410 and 422 Lactic acid 4,9, 3,6 and 1,9   CXR wpositive cardiomegaly, with bilateral hilar vascular congestion with bilateral interstitial infiltrates  - Admitted, started on diuretics  Subjective: Awake today, breathing continues to slowly improve, swelling improving as well  Assessment and Plan:  Acute on chronic diastolic CHF -Echo limited views, grossly preserved EF 60 to 65%, mild LVH, RV not well visualized, trivial pericardial effusion.  -Lactic acid trended down -Improving with diuresis, 13.2 L negative, down 32lb, creatinine improving as well -Discussed with cards, residual edema could be secondary to hypoalbuminemia and third spacing transitioning to oral torsemide  -As she is on warfarin, defer RHC at this time - Cards following, GDMT limited by CKD, poor candidate for SGLT2i - Increase activity, BMP in a.m.  Elevated troponin  - Secondary to demand, type II MI due to heart failure. No ACS.  Acute hypoxemic respiratory failure due to acute cardiogenic pulmonary edema -as above Pneumonia ruled out - Also suspect she has undiagnosed OSA/OHS, would benefit from sleep study after discharge  Congestive hepatopathy, continue diuresis  AST and ALT are trending down.   Persistent atrial fibrillation (HCC) Ventricular paced rhythm.  -Continue metoprolol   discontinue amiodarone  due to elevation in LFT  Anticoagulated with warfarin, unable to afford direct oral anticoagulants.   Acute renal failure superimposed on stage 3b chronic kidney disease (HCC) Hyperkalemia.  - Improving, likely cardiorenal, baseline creatinine  around 1.5-1.7, creatinine was 3.01 on admission, now improving  Thrombocytopenia (HCC) Cell count stable.   Type 2 diabetes mellitus with hyperlipidemia (HCC) -Stable, SSI  COPD (chronic obstructive pulmonary disease) (HCC) No signs of acute exacerbation, continue with bronchodilator therapy Continue 02 monitoring and supplemental 02 to keep 02 saturation 92% or greater.,  On ICS and LABA.   Restless leg syndrome Decreased Requip  dose  Obesity, class 3 Calculated BMI is 56.1    DVT prophylaxis: warfarin Code Status: Full Code Family Communication: None present Disposition Plan: Plan for rehab when improved  Consultants:    Procedures:   Antimicrobials:    Objective: Vitals:   12/27/23 0030 12/27/23 0536 12/27/23 0731 12/27/23 0816  BP: (!) 104/48 (!) 105/41  (!) 99/43  Pulse: (!) 59 60  64  Resp: 17 20  19   Temp: 97.6 F (36.4 C) 97.6 F (36.4 C)  97.7 F (36.5 C)  TempSrc: Oral Oral  Oral  SpO2: 93% 95% 94% 94%  Weight:  128.8 kg    Height:        Intake/Output Summary (Last 24 hours) at 12/27/2023 1006 Last data filed at 12/27/2023 8119 Gross per 24 hour  Intake 596 ml  Output 2800 ml  Net -2204 ml   Filed Weights   12/25/23 0543 12/25/23 2356 12/27/23 0536  Weight: 130.6 kg 135.6 kg 128.8 kg    Examination:  Obese chronically ill female laying in bed, AAO x 2, no distress HEENT: Neck obese unable to assess JVD CVS: S1-S2, irregular rhythm Lungs: Few conducted upper airway sounds, decreased at the bases Abdomen: Soft, nontender, bowel sounds present Extremities: Edema in lower leg, 2+ in both flanks  Data  Reviewed:   CBC: Recent Labs  Lab 12/20/23 1815 12/20/23 1849 12/21/23 0241 12/22/23 0251 12/23/23 0313 12/25/23 0751 12/26/23 0249 12/27/23 0249  WBC 9.2  --  6.3 5.8 5.5 5.4 4.5 4.5  NEUTROABS 6.5  --  5.8  --   --   --   --   --   HGB 14.1   < > 13.6 13.0 12.1 12.9 12.1 12.3  HCT 50.6*   < > 46.8* 44.8 41.9 45.0 41.3 42.6   MCV 95.3  --  93.0 91.2 92.5 93.6 92.0 92.2  PLT 113*  --  105* 115* 79* 59* 59* 67*   < > = values in this interval not displayed.   Basic Metabolic Panel: Recent Labs  Lab 12/23/23 0313 12/24/23 0330 12/25/23 0324 12/26/23 0249 12/27/23 0249  NA 138 138 139 141 142  K 4.6 4.3 3.9 3.5 3.6  CL 96* 94* 92* 89* 90*  CO2 33* 37* 40* 41* 43*  GLUCOSE 146* 125* 104* 128* 142*  BUN 50* 52* 51* 46* 44*  CREATININE 2.65* 2.36* 2.19* 1.94* 1.79*  CALCIUM  7.9* 8.2* 8.2* 8.4* 8.7*  MG 2.3 2.2 2.0 1.8 2.0   GFR: Estimated Creatinine Clearance: 32.8 mL/min (A) (by C-G formula based on SCr of 1.79 mg/dL (H)). Liver Function Tests: Recent Labs  Lab 12/23/23 0313 12/24/23 0330 12/25/23 0324 12/26/23 0249 12/27/23 0249  AST 145* 67* 38 26 22  ALT 439* 313* 201* 134* 101*  ALKPHOS 78 83 64 53 58  BILITOT 1.0 1.2 1.3* 1.3* 1.1  PROT 5.0* 5.5* 5.2* 4.8* 5.1*  ALBUMIN 2.6* 3.0* 2.7* 2.5* 2.6*   Recent Labs  Lab 12/20/23 2014  LIPASE 19   No results for input(s): AMMONIA in the last 168 hours. Coagulation Profile: Recent Labs  Lab 12/21/23 0241 12/25/23 0751 12/26/23 0249 12/27/23 0249  INR 2.1* 1.4* 1.4* 1.7*   Cardiac Enzymes: No results for input(s): CKTOTAL, CKMB, CKMBINDEX, TROPONINI in the last 168 hours. BNP (last 3 results) No results for input(s): PROBNP in the last 8760 hours. HbA1C: No results for input(s): HGBA1C in the last 72 hours. CBG: Recent Labs  Lab 12/26/23 0616 12/26/23 1103 12/26/23 1544 12/26/23 2108 12/27/23 0603  GLUCAP 119* 148* 180* 193* 107*   Lipid Profile: No results for input(s): CHOL, HDL, LDLCALC, TRIG, CHOLHDL, LDLDIRECT in the last 72 hours. Thyroid  Function Tests: No results for input(s): TSH, T4TOTAL, FREET4, T3FREE, THYROIDAB in the last 72 hours. Anemia Panel: No results for input(s): VITAMINB12, FOLATE, FERRITIN, TIBC, IRON, RETICCTPCT in the last 72 hours. Urine analysis:     Component Value Date/Time   COLORURINE YELLOW 07/15/2022 2012   APPEARANCEUR CLOUDY (A) 07/15/2022 2012   LABSPEC 1.014 07/15/2022 2012   PHURINE 5.0 07/15/2022 2012   GLUCOSEU >=500 (A) 07/15/2022 2012   HGBUR SMALL (A) 07/15/2022 2012   BILIRUBINUR NEGATIVE 07/15/2022 2012   KETONESUR NEGATIVE 07/15/2022 2012   PROTEINUR NEGATIVE 07/15/2022 2012   NITRITE NEGATIVE 07/15/2022 2012   LEUKOCYTESUR LARGE (A) 07/15/2022 2012   Sepsis Labs: @LABRCNTIP (procalcitonin:4,lacticidven:4)  ) Recent Results (from the past 240 hours)  Resp panel by RT-PCR (RSV, Flu A&B, Covid) Anterior Nasal Swab     Status: None   Collection Time: 12/20/23  6:07 PM   Specimen: Anterior Nasal Swab  Result Value Ref Range Status   SARS Coronavirus 2 by RT PCR NEGATIVE NEGATIVE Final   Influenza A by PCR NEGATIVE NEGATIVE Final   Influenza B by PCR NEGATIVE NEGATIVE Final  Comment: (NOTE) The Xpert Xpress SARS-CoV-2/FLU/RSV plus assay is intended as an aid in the diagnosis of influenza from Nasopharyngeal swab specimens and should not be used as a sole basis for treatment. Nasal washings and aspirates are unacceptable for Xpert Xpress SARS-CoV-2/FLU/RSV testing.  Fact Sheet for Patients: BloggerCourse.com  Fact Sheet for Healthcare Providers: SeriousBroker.it  This test is not yet approved or cleared by the [place]  FDA and has been authorized for detection and/or diagnosis of SARS-CoV-2 by FDA under an Emergency Use Authorization (EUA). This EUA will remain in effect (meaning this test can be used) for the duration of the COVID-19 declaration under Section 564(b)(1) of the Act, 21 U.S.C. section 360bbb-3(b)(1), unless the authorization is terminated or revoked.     Resp Syncytial Virus by PCR NEGATIVE NEGATIVE Final    Comment: (NOTE) Fact Sheet for Patients: BloggerCourse.com  Fact Sheet for Healthcare  Providers: SeriousBroker.it  This test is not yet approved or cleared by the [place]  FDA and has been authorized for detection and/or diagnosis of SARS-CoV-2 by FDA under an Emergency Use Authorization (EUA). This EUA will remain in effect (meaning this test can be used) for the duration of the COVID-19 declaration under Section 564(b)(1) of the Act, 21 U.S.C. section 360bbb-3(b)(1), unless the authorization is terminated or revoked.  Performed at [facility], [address] [place], [place] [address]   Blood culture (routine x 2)     Status: Abnormal   Collection Time: 12/20/23  7:05 PM   Specimen: BLOOD RIGHT HAND  Result Value Ref Range Status   Specimen Description BLOOD RIGHT HAND  Final   Special Requests   Final    BOTTLES DRAWN AEROBIC AND ANAEROBIC Blood Culture adequate volume   Culture  Setup Time   Final    GRAM POSITIVE RODS AEROBIC BOTTLE ONLY CRITICAL RESULT CALLED TO, READ BACK BY AND VERIFIED WITH: PHARMD JESSICA MILLEN [id number] BY J RAZZAK, MT    Culture (A)  Final    DIPHTHEROIDS(CORYNEBACTERIUM SPECIES) Standardized susceptibility testing for this organism is not available. Performed at [facility], [address] [place], [place] [address]    Report Status 12/24/2023 FINAL  Final  Blood culture (routine x 2)     Status: Abnormal   Collection Time: 12/20/23  9:23 PM   Specimen: BLOOD LEFT ARM  Result Value Ref Range Status   Specimen Description BLOOD LEFT ARM  Final   Special Requests   Final    BOTTLES DRAWN AEROBIC AND ANAEROBIC Blood Culture results may not be optimal due to an inadequate volume of blood received in culture bottles   Culture  Setup Time   Final    GRAM POSITIVE COCCI IN CLUSTERS ANAEROBIC BOTTLE ONLY CRITICAL RESULT CALLED TO, READ BACK BY AND VERIFIED WITH: PHARMD LISA CURRAN ON 12/21/23 @ 1826 BY DRT    Culture (A)  Final    STAPHYLOCOCCUS EPIDERMIDIS THE SIGNIFICANCE OF  ISOLATING THIS ORGANISM FROM A SINGLE SET OF BLOOD CULTURES WHEN MULTIPLE SETS ARE DRAWN IS UNCERTAIN. PLEASE NOTIFY THE MICROBIOLOGY DEPARTMENT WITHIN ONE WEEK IF SPECIATION AND SENSITIVITIES ARE REQUIRED. Performed at [facility], [address] [place], [place] [address]    Report Status 12/23/2023 FINAL  Final  Blood Culture ID Panel (Reflexed)     Status: Abnormal   Collection Time: 12/20/23  9:23 PM  Result Value Ref Range Status   Enterococcus faecalis NOT DETECTED NOT DETECTED Final   Enterococcus Faecium NOT  DETECTED NOT DETECTED Final   Listeria monocytogenes NOT DETECTED NOT DETECTED Final   Staphylococcus species DETECTED (A) NOT DETECTED Final    Comment: CRITICAL RESULT CALLED TO, READ BACK BY AND VERIFIED WITH: PHARMD LISA CURRAN ON 12/21/23 @ 1826 BY DRT    Staphylococcus aureus (BCID) NOT DETECTED NOT DETECTED Final   Staphylococcus epidermidis DETECTED (A) NOT DETECTED Final    Comment: Methicillin (oxacillin) resistant coagulase negative staphylococcus. Possible blood culture contaminant (unless isolated from more than one blood culture draw or clinical case suggests pathogenicity). No antibiotic treatment is indicated for blood  culture contaminants. CRITICAL RESULT CALLED TO, READ BACK BY AND VERIFIED WITH: PHARMD LISA CURRAN ON 12/21/23 @ 1826 BY DRT    Staphylococcus lugdunensis NOT DETECTED NOT DETECTED Final   Streptococcus species NOT DETECTED NOT DETECTED Final   Streptococcus agalactiae NOT DETECTED NOT DETECTED Final   Streptococcus pneumoniae NOT DETECTED NOT DETECTED Final   Streptococcus pyogenes NOT DETECTED NOT DETECTED Final   A.calcoaceticus-baumannii NOT DETECTED NOT DETECTED Final   Bacteroides fragilis NOT DETECTED NOT DETECTED Final   Enterobacterales NOT DETECTED NOT DETECTED Final   Enterobacter cloacae complex NOT DETECTED NOT DETECTED Final   Escherichia coli NOT DETECTED NOT DETECTED Final   Klebsiella aerogenes NOT DETECTED NOT  DETECTED Final   Klebsiella oxytoca NOT DETECTED NOT DETECTED Final   Klebsiella pneumoniae NOT DETECTED NOT DETECTED Final   Proteus species NOT DETECTED NOT DETECTED Final   Salmonella species NOT DETECTED NOT DETECTED Final   Serratia marcescens NOT DETECTED NOT DETECTED Final   Haemophilus influenzae NOT DETECTED NOT DETECTED Final   Neisseria meningitidis NOT DETECTED NOT DETECTED Final   Pseudomonas aeruginosa NOT DETECTED NOT DETECTED Final   Stenotrophomonas maltophilia NOT DETECTED NOT DETECTED Final   Candida albicans NOT DETECTED NOT DETECTED Final   Candida auris NOT DETECTED NOT DETECTED Final   Candida glabrata NOT DETECTED NOT DETECTED Final   Candida krusei NOT DETECTED NOT DETECTED Final   Candida parapsilosis NOT DETECTED NOT DETECTED Final   Candida tropicalis NOT DETECTED NOT DETECTED Final   Cryptococcus neoformans/gattii NOT DETECTED NOT DETECTED Final   Methicillin resistance mecA/C DETECTED (A) NOT DETECTED Final    Comment: CRITICAL RESULT CALLED TO, READ BACK BY AND VERIFIED WITH: PHARMD LISA CURRAN ON 12/21/23 @ 1826 BY DRT Performed at [facility], [address], [place], [place] [address]      Radiology Studies: No results found.   Scheduled Meds:  acetaZOLAMIDE  500 mg Oral BID   amiodarone   200 mg Oral Daily   benzonatate  100 mg Oral TID   ferrous sulfate  325 mg Oral QHS   fluticasone  furoate-vilanterol  1 puff Inhalation Daily   gabapentin   300 mg Oral QHS   Gerhardt's butt cream   Topical BID   insulin  aspart  0-9 Units Subcutaneous TID WC   metoprolol  succinate  25 mg Oral Daily   nystatin   Topical BID   potassium chloride  40 mEq Oral Once   rOPINIRole   2 mg Oral q AM   sodium chloride  flush  3 mL Intravenous Q12H   [START ON 12/28/2023] torsemide   60 mg Oral Daily   warfarin  7.5 mg Oral ONCE-1600   Warfarin - Pharmacist Dosing Inpatient   Does not apply q1600   Continuous Infusions:   LOS: 7 days    Time spent:     Deforest Fast, MD [facility]   12/27/2023, 10:06 AM

## 2023-12-27 NOTE — Progress Notes (Addendum)
 Rounding Note   Patient Name: Holly Spence Date of Encounter: 12/27/2023  [facility] Cardiologist: Nelia Balzarine, MD   Subjective Net -2.4L yesterday, -13.2 L on admission. Creatinine improving (3.0 >2.80 > 2.73 > 2.65 >2.36>2.19>1.94 >1.79).  Liver enzymes continue to improve.  BP 99/43.  She denies any dyspnea   Scheduled Meds:  benzonatate  100 mg Oral TID   ferrous sulfate  325 mg Oral QHS   fluticasone  furoate-vilanterol  1 puff Inhalation Daily   furosemide   80 mg Intravenous Q12H   gabapentin   300 mg Oral QHS   Gerhardt's butt cream   Topical BID   insulin  aspart  0-9 Units Subcutaneous TID WC   metoprolol  succinate  25 mg Oral Daily   nystatin   Topical BID   rOPINIRole   2 mg Oral q AM   sodium chloride  flush  3 mL Intravenous Q12H   warfarin  7.5 mg Oral ONCE-1600   Warfarin - Pharmacist Dosing Inpatient   Does not apply q1600   Continuous Infusions:  PRN Meds: acetaminophen  **OR** acetaminophen , albuterol , mouth rinse, oxyCODONE , trimethobenzamide    Vital Signs  Vitals:   12/27/23 0030 12/27/23 0536 12/27/23 0731 12/27/23 0816  BP: (!) 104/48 (!) 105/41  (!) 99/43  Pulse: (!) 59 60  64  Resp: 17 20  19   Temp: 97.6 F (36.4 C) 97.6 F (36.4 C)  97.7 F (36.5 C)  TempSrc: Oral Oral  Oral  SpO2: 93% 95% 94% 94%  Weight:  128.8 kg    Height:        Intake/Output Summary (Last 24 hours) at 12/27/2023 0922 Last data filed at 12/27/2023 0814 Gross per 24 hour  Intake 596 ml  Output 2800 ml  Net -2204 ml      12/27/2023    5:36 AM 12/25/2023   11:56 PM 12/25/2023    5:43 AM  Last 3 Weights  Weight (lbs) 284 lb 299 lb 288 lb  Weight (kg) 128.822 kg 135.626 kg 130.636 kg      Telemetry V paced- Personally Reviewed  ECG  No new ECG- Personally Reviewed  Physical Exam  GEN: No acute distress.   Neck: Difficult to assess given habitus Cardiac: RRR, no murmurs, rubs, or gallops.  Respiratory: Scattered rhonchi GI: Soft, nontender,  non-distended  MS: Trace edema Neuro:  Nonfocal  Psych: Normal affect   Labs High Sensitivity Troponin:   Recent Labs  Lab 12/20/23 1815 12/20/23 2016 12/21/23 1007  TROPONINIHS 410* 425* 442*     Chemistry Recent Labs  Lab 12/25/23 0324 12/26/23 0249 12/27/23 0249  NA 139 141 142  K 3.9 3.5 3.6  CL 92* 89* 90*  CO2 40* 41* 43*  GLUCOSE 104* 128* 142*  BUN 51* 46* 44*  CREATININE 2.19* 1.94* 1.79*  CALCIUM  8.2* 8.4* 8.7*  MG 2.0 1.8 2.0  PROT 5.2* 4.8* 5.1*  ALBUMIN 2.7* 2.5* 2.6*  AST 38 26 22  ALT 201* 134* 101*  ALKPHOS 64 53 58  BILITOT 1.3* 1.3* 1.1  GFRNONAA 22* 26* 28*  ANIONGAP 7 11 9     Lipids No results for input(s): CHOL, TRIG, HDL, LABVLDL, LDLCALC, CHOLHDL in the last 168 hours.  Hematology Recent Labs  Lab 12/25/23 0751 12/26/23 0249 12/27/23 0249  WBC 5.4 4.5 4.5  RBC 4.81 4.49 4.62  HGB 12.9 12.1 12.3  HCT 45.0 41.3 42.6  MCV 93.6 92.0 92.2  MCH 26.8 26.9 26.6  MCHC 28.7* 29.3* 28.9*  RDW 19.7* 19.4*  19.6*  PLT 59* 59* 67*   Thyroid  No results for input(s): TSH, FREET4 in the last 168 hours.  BNP Recent Labs  Lab 12/20/23 1821  BNP 1,019.0*    DDimer No results for input(s): DDIMER in the last 168 hours.   Radiology  No results found.   Cardiac Studies   Patient Profile   78 y.o. female with history of atrial fibrillation, T2DM, COPD, CKD, diastolic heart failure who presents with acute hypoxic respiratory failure  Assessment & Plan  Acute on chronic diastolic heart failure: Echocardiogram with very limited views but grossly normal LV systolic function.  Renal and liver function continue to improve with diuresis - She has diuresed well, net -13 L on admission.  She received IV Lasix  this morning.  Suspect we may be reaching euvolemia given soft pressures and worsening alkalosis.  Plan transition to Po torsemide  tomorrow -Strict I's/O's and daily weights  Elevated troponin: 410 > 425 > 442.  Flat troponin,  not consistent with acute coronary syndrome.  Suspect demand ischemia in setting of decompensated heart failure.  Persistent Afib: Currently in atrial sensed, Vpaced rhythm.  Continue metoprolol .  Held amiodarone  given transaminitis, can restart given transaminitis resolving.  Eliquis  too expensive, transitioned to warfarin.  AKI on CKD: Creatinine 3.0 on admission.  Likely cardiorenal syndrome, has been improving with diuresis, creatinine 1.79 today  Morbid obesity: Body mass index is 50.31 kg/m.  Transaminitis: Suspect congestive hepatopathy, improving with diuresis.  Amiodarone  held given transaminitis, will restart given resolution   For questions or updates, please contact [facility] Please consult [email or web address] for contact info under     Signed, Wendie Hamburg, MD  12/27/2023, 9:22 AM

## 2023-12-27 NOTE — Plan of Care (Signed)
  Problem: Tissue Perfusion: Goal: Adequacy of tissue perfusion will improve Outcome: Progressing   Problem: Clinical Measurements: Goal: Will remain free from infection Outcome: Progressing Goal: Respiratory complications will improve Outcome: Progressing Goal: Cardiovascular complication will be avoided Outcome: Progressing   Problem: Elimination: Goal: Will not experience complications related to bowel motility Outcome: Progressing Goal: Will not experience complications related to urinary retention Outcome: Progressing

## 2023-12-27 NOTE — Progress Notes (Addendum)
 PHARMACY - ANTICOAGULATION CONSULT NOTE  Pharmacy Consult for Warfarin Indication: atrial fibrillation  Allergies  Allergen Reactions   Penicillins Hives and Other (See Comments)    At injection site only- hives   Sulfa Antibiotics Hives   Erythromycin Other (See Comments)    Unsure    Azithromycin Rash and Other (See Comments)    Broke out the legs   Ciprofloxacin Hcl Other (See Comments)    Abdominal Pain    Patient Measurements: Height: 5' 3 (160 cm) Weight: 128.8 kg (284 lb) IBW/kg (Calculated) : 52.4 HEPARIN  DW (KG): 89  Vital Signs: Temp: 97.6 F (36.4 C) (06/19 0536) Temp Source: Oral (06/19 0536) BP: 105/41 (06/19 0536) Pulse Rate: 60 (06/19 0536)  Labs: Recent Labs    12/25/23 0324 12/25/23 0751 12/25/23 0751 12/26/23 0249 12/27/23 0249  HGB  --  12.9   < > 12.1 12.3  HCT  --  45.0  --  41.3 42.6  PLT  --  59*  --  59* 67*  LABPROT  --  17.3*  --  17.0* 19.8*  INR  --  1.4*  --  1.4* 1.7*  CREATININE 2.19*  --   --  1.94* 1.79*   < > = values in this interval not displayed.    Estimated Creatinine Clearance: 32.8 mL/min (A) (by C-G formula based on SCr of 1.79 mg/dL (H)).   Medical History: Past Medical History:  Diagnosis Date   Allergic rhinitis 09/20/2015   Arthritis    Ascending aorta dilation (HCC) 12/09/2018   Back pain    Benign essential hypertension 09/20/2015   Bruises easily    Complete heart block (HCC) 06/11/2020   Contact lens/glasses fitting    Cough    Depression 02/08/2019   Diabetes mellitus due to underlying condition with unspecified complications (HCC) 11/10/2015   Duodenal ulcer 02/08/2019   Fatigue    Gastroesophageal reflux disease 09/20/2015   GI bleeding 06/27/2019   Hx of laparoscopic gastric banding 01/31/2011   Surgery: 03/22/10    Hyperlipidemia    Insulin  long-term use (HCC) 09/20/2015   Melena 02/08/2019   Moderate asthma 02/08/2019   Palpitations 11/10/2015   Poor circulation    Right renal mass 02/08/2019   Sinus  problem    Sleep apnea    SOB (shortness of breath)    Symptomatic bradycardia 06/11/2020   Uncontrolled type 2 diabetes mellitus without complication, with long-term current use of insulin  09/20/2015   Assessment: Holly Spence is a 78 year old female with PMH DM2, HLD, OSA, and PAF. She is being switched from Eliquis  to warfarin as the result of cost issues. Her CBC is stable. Her last dose of Eliquis  2.5 mg was 12/24/23 AM. Given CHADSVAS 6, no recent cardioversion, and no recent ablaitons, patient will not be bridged.  INR is still subtherapeutic at 1.7 (+0.3) after warfarin 6 mg x1. CBC is stable today and no signs of bleeding. Patient's diet is unchanged and she is eating well. Of note, ropinirole  was reduced on 12/26/23 from 4 mg to 2 mg daily, which could reduce INR.  Goal of Therapy:  INR 2-3 Monitor platelets by anticoagulation protocol: Yes   Plan:  Warfarin 7.5 mg x1  Monitor INR, CBC, and signs of bleeding Monitor diet and DDI with concomitant therapies  Albino Alu, PharmD PGY2 Cardiology Pharmacy Resident 12/27/2023,7:34 AM

## 2023-12-28 DIAGNOSIS — I4819 Other persistent atrial fibrillation: Secondary | ICD-10-CM | POA: Diagnosis not present

## 2023-12-28 DIAGNOSIS — I5033 Acute on chronic diastolic (congestive) heart failure: Secondary | ICD-10-CM | POA: Diagnosis not present

## 2023-12-28 DIAGNOSIS — N1832 Chronic kidney disease, stage 3b: Secondary | ICD-10-CM | POA: Diagnosis not present

## 2023-12-28 DIAGNOSIS — R0602 Shortness of breath: Secondary | ICD-10-CM | POA: Insufficient documentation

## 2023-12-28 DIAGNOSIS — N179 Acute kidney failure, unspecified: Secondary | ICD-10-CM | POA: Diagnosis not present

## 2023-12-28 LAB — COMPREHENSIVE METABOLIC PANEL WITH GFR
ALT: 71 U/L — ABNORMAL HIGH (ref 0–44)
AST: 20 U/L (ref 15–41)
Albumin: 2.4 g/dL — ABNORMAL LOW (ref 3.5–5.0)
Alkaline Phosphatase: 53 U/L (ref 38–126)
Anion gap: 12 (ref 5–15)
BUN: 39 mg/dL — ABNORMAL HIGH (ref 8–23)
CO2: 36 mmol/L — ABNORMAL HIGH (ref 22–32)
Calcium: 8.3 mg/dL — ABNORMAL LOW (ref 8.9–10.3)
Chloride: 92 mmol/L — ABNORMAL LOW (ref 98–111)
Creatinine, Ser: 1.81 mg/dL — ABNORMAL HIGH (ref 0.44–1.00)
GFR, Estimated: 28 mL/min — ABNORMAL LOW (ref >60)
Glucose, Bld: 159 mg/dL — ABNORMAL HIGH (ref 70–99)
Potassium: 3.6 mmol/L (ref 3.5–5.1)
Sodium: 140 mmol/L (ref 135–145)
Total Bilirubin: 1 mg/dL (ref 0.0–1.2)
Total Protein: 4.9 g/dL — ABNORMAL LOW (ref 6.5–8.1)

## 2023-12-28 LAB — MAGNESIUM: Magnesium: 2 mg/dL (ref 1.7–2.4)

## 2023-12-28 LAB — PROTIME-INR
INR: 2.2 — ABNORMAL HIGH (ref 0.8–1.2)
Prothrombin Time: 24.3 s — ABNORMAL HIGH (ref 11.4–15.2)

## 2023-12-28 LAB — GLUCOSE, CAPILLARY
Glucose-Capillary: 145 mg/dL — ABNORMAL HIGH (ref 70–99)
Glucose-Capillary: 177 mg/dL — ABNORMAL HIGH (ref 70–99)
Glucose-Capillary: 289 mg/dL — ABNORMAL HIGH (ref 70–99)
Glucose-Capillary: 184 mg/dL — ABNORMAL HIGH (ref 70–99)

## 2023-12-28 MED ORDER — WARFARIN SODIUM 5 MG PO TABS
5.0000 mg | ORAL_TABLET | Freq: Every day | ORAL | Status: DC
  Administered 2023-12-28: 5 mg via ORAL
  Filled 2023-12-28: qty 1

## 2023-12-28 NOTE — Progress Notes (Addendum)
 Rounding Note   Patient Name: Holly Spence Date of Encounter: 12/28/2023  [facility] Cardiologist: Nelia Balzarine, MD   Subjective Net - 500 cc yesterday, -13.8 L on admission. Creatinine stable at 1.8.  Liver enzymes continue to improve.  BP 111/42.  She denies any dyspnea   Scheduled Meds:  amiodarone   200 mg Oral Daily   benzonatate  100 mg Oral TID   ferrous sulfate  325 mg Oral QHS   fluticasone  furoate-vilanterol  1 puff Inhalation Daily   gabapentin   300 mg Oral QHS   Gerhardt's butt cream   Topical BID   insulin  aspart  0-9 Units Subcutaneous TID WC   metoprolol  succinate  25 mg Oral Daily   nystatin   Topical BID   rOPINIRole   2 mg Oral q AM   sodium chloride  flush  3 mL Intravenous Q12H   torsemide   60 mg Oral Daily   Warfarin - Pharmacist Dosing Inpatient   Does not apply q1600   Continuous Infusions:  PRN Meds: acetaminophen  **OR** acetaminophen , albuterol , mouth rinse, oxyCODONE , trimethobenzamide    Vital Signs  Vitals:   12/28/23 0027 12/28/23 0457 12/28/23 0754 12/28/23 0832  BP: (!) 113/98 (!) 103/49 (!) 111/42   Pulse: 65 61 65   Resp: 20 19 20    Temp: 97.9 F (36.6 C) 97.8 F (36.6 C) 97.6 F (36.4 C)   TempSrc: Axillary Oral Axillary   SpO2: 93% 91% 92% 95%  Weight:  127.9 kg    Height:        Intake/Output Summary (Last 24 hours) at 12/28/2023 0944 Last data filed at 12/28/2023 0507 Gross per 24 hour  Intake 357 ml  Output 1175 ml  Net -818 ml      12/28/2023    4:57 AM 12/27/2023    5:36 AM 12/25/2023   11:56 PM  Last 3 Weights  Weight (lbs) 282 lb 284 lb 299 lb  Weight (kg) 127.914 kg 128.822 kg 135.626 kg      Telemetry V paced- Personally Reviewed  ECG  No new ECG- Personally Reviewed  Physical Exam  GEN: No acute distress.   Neck: Difficult to assess JVD given habitus Cardiac: RRR, no murmurs, rubs, or gallops.  Respiratory: Diminished breath sounds GI: Soft, nontender, non-distended  MS: Trace  edema Neuro:  Nonfocal  Psych: Normal affect   Labs High Sensitivity Troponin:   Recent Labs  Lab 12/20/23 1815 12/20/23 2016 12/21/23 1007  TROPONINIHS 410* 425* 442*     Chemistry Recent Labs  Lab 12/26/23 0249 12/27/23 0249 12/28/23 0241  NA 141 142 140  K 3.5 3.6 3.6  CL 89* 90* 92*  CO2 41* 43* 36*  GLUCOSE 128* 142* 159*  BUN 46* 44* 39*  CREATININE 1.94* 1.79* 1.81*  CALCIUM  8.4* 8.7* 8.3*  MG 1.8 2.0 2.0  PROT 4.8* 5.1* 4.9*  ALBUMIN 2.5* 2.6* 2.4*  AST 26 22 20   ALT 134* 101* 71*  ALKPHOS 53 58 53  BILITOT 1.3* 1.1 1.0  GFRNONAA 26* 28* 28*  ANIONGAP 11 9 12     Lipids No results for input(s): CHOL, TRIG, HDL, LABVLDL, LDLCALC, CHOLHDL in the last 168 hours.  Hematology Recent Labs  Lab 12/25/23 0751 12/26/23 0249 12/27/23 0249  WBC 5.4 4.5 4.5  RBC 4.81 4.49 4.62  HGB 12.9 12.1 12.3  HCT 45.0 41.3 42.6  MCV 93.6 92.0 92.2  MCH 26.8 26.9 26.6  MCHC 28.7* 29.3* 28.9*  RDW 19.7* 19.4* 19.6*  PLT  59* 59* 67*   Thyroid  No results for input(s): TSH, FREET4 in the last 168 hours.  BNP No results for input(s): BNP, PROBNP in the last 168 hours.   DDimer No results for input(s): DDIMER in the last 168 hours.   Radiology  No results found.   Cardiac Studies   Patient Profile   78 y.o. female with history of atrial fibrillation, T2DM, COPD, CKD, diastolic heart failure who presents with acute hypoxic respiratory failure  Assessment & Plan  Acute on chronic diastolic heart failure: Echocardiogram with very limited views but grossly normal LV systolic function.  Renal and liver function continue to improve with diuresis - She has diuresed well, net -13.8 L on admission.  Suspect reaching euvolemia given soft pressures and worsening alkalosis, volume status appears significantly improved on exam.  Transition to p.o. torsemide  today -Strict I's/O's and daily weights  Elevated troponin: 410 > 425 > 442.  Flat troponin, not  consistent with acute coronary syndrome.  Suspect demand ischemia in setting of decompensated heart failure.  Persistent Afib: Currently in atrial sensed, Vpaced rhythm.  Continue metoprolol .  Held amiodarone  given transaminitis, can restart given transaminitis resolving.  Eliquis  too expensive, transitioned to warfarin.  Will need follow-up in Coumadin clinic on discharge  AKI on CKD: Creatinine 3.0 on admission.  Likely cardiorenal syndrome, has been improving with diuresis, creatinine 1.8 today  Morbid obesity: Body mass index is 49.95 kg/m.  Transaminitis: Suspect congestive hepatopathy, improving with diuresis.  Amiodarone  held given transaminitis, restarted given resolution   For questions or updates, please contact  [facility] Please consult [email or web address] for contact info under     Signed, Wendie Hamburg, MD  12/28/2023, 9:44 AM

## 2023-12-28 NOTE — Plan of Care (Signed)
   Problem: Clinical Measurements: Goal: Will remain free from infection Outcome: Adequate for Discharge

## 2023-12-28 NOTE — Progress Notes (Signed)
 PHARMACY - ANTICOAGULATION CONSULT NOTE  Pharmacy Consult for Warfarin Indication: atrial fibrillation  Allergies  Allergen Reactions   Penicillins Hives and Other (See Comments)    At injection site only- hives   Sulfa Antibiotics Hives   Erythromycin Other (See Comments)    Unsure    Azithromycin Rash and Other (See Comments)    Broke out the legs   Ciprofloxacin Hcl Other (See Comments)    Abdominal Pain    Patient Measurements: Height: 5' 3 (160 cm) Weight: 127.9 kg (282 lb) IBW/kg (Calculated) : 52.4 HEPARIN  DW (KG): 89  Vital Signs: Temp: 98.4 F (36.9 C) (06/20 1145) Temp Source: Axillary (06/20 1145) BP: 105/47 (06/20 1145) Pulse Rate: 66 (06/20 1145)  Labs: Recent Labs    12/26/23 0249 12/27/23 0249 12/28/23 0241  HGB 12.1 12.3  --   HCT 41.3 42.6  --   PLT 59* 67*  --   LABPROT 17.0* 19.8* 24.3*  INR 1.4* 1.7* 2.2*  CREATININE 1.94* 1.79* 1.81*    Estimated Creatinine Clearance: 32.3 mL/min (A) (by C-G formula based on SCr of 1.81 mg/dL (H)).   Medical History: Past Medical History:  Diagnosis Date   Allergic rhinitis 09/20/2015   Arthritis    Ascending aorta dilation (HCC) 12/09/2018   Back pain    Benign essential hypertension 09/20/2015   Bruises easily    Complete heart block (HCC) 06/11/2020   Contact lens/glasses fitting    Cough    Depression 02/08/2019   Diabetes mellitus due to underlying condition with unspecified complications (HCC) 11/10/2015   Duodenal ulcer 02/08/2019   Fatigue    Gastroesophageal reflux disease 09/20/2015   GI bleeding 06/27/2019   Hx of laparoscopic gastric banding 01/31/2011   Surgery: 03/22/10    Hyperlipidemia    Insulin  long-term use (HCC) 09/20/2015   Melena 02/08/2019   Moderate asthma 02/08/2019   Palpitations 11/10/2015   Poor circulation    Right renal mass 02/08/2019   Sinus problem    Sleep apnea    SOB (shortness of breath)    Symptomatic bradycardia 06/11/2020   Uncontrolled type 2 diabetes mellitus  without complication, with long-term current use of insulin  09/20/2015   Assessment: Holly Spence is a 78 year old female with PMH DM2, HLD, OSA, and PAF. She is being switched from Eliquis  to warfarin as the result of cost issues. Her CBC is stable. Her last dose of Eliquis  2.5 mg was 12/24/23 AM. Given CHADSVAS 6, no recent cardioversion, and no recent ablaitons, patient will not be bridged.  INR is therapeutic at 2.2 after 2 boost doses.  CBC is stable today and no signs of bleeding. Patient's diet is unchanged and she is eating well.  Patient has post hospital INR check scheduled 01/01/24 with CVRR in [place].  Goal of Therapy:  INR 2-3 Monitor platelets by anticoagulation protocol: Yes   Plan:  Warfarin 5mg  daily   Monitor INR, CBC, and signs of bleeding Monitor diet and DDI with concomitant therapies    Hortensia Ma Pharm.D. CPP, BCPS Clinical Pharmacist [phone] 12/28/2023 11:50 AM

## 2023-12-28 NOTE — Plan of Care (Signed)
  Problem: Education: Goal: Ability to describe self-care measures that may prevent or decrease complications (Diabetes Survival Skills Education) will improve Outcome: Progressing Goal: Individualized Educational Video(s) Outcome: Progressing   Problem: Coping: Goal: Ability to adjust to condition or change in health will improve Outcome: Progressing   Problem: Fluid Volume: Goal: Ability to maintain a balanced intake and output will improve Outcome: Progressing   Problem: Metabolic: Goal: Ability to maintain appropriate glucose levels will improve Outcome: Progressing   Problem: Nutritional: Goal: Maintenance of adequate nutrition will improve Outcome: Progressing Goal: Progress toward achieving an optimal weight will improve Outcome: Progressing

## 2023-12-28 NOTE — Progress Notes (Signed)
 PROGRESS NOTE    Holly Spence  ZOX:[id number] DOB: 04/11/1946 DOA: 12/20/2023 PCP: [facility]   78/F w history of atrial fibrillation, T2DM, COPD,CKD and diastolic CHF presented with dyspnea x 3 wks associated with lower extremity edema, and non productive cough.  In ED, + edema,  bun 34 cr 3,0 , AST 1,744 ALT 844 , BNP 1,019 , troponin 410 and 422 Lactic acid 4,9, 3,6 and 1,9   CXR wpositive cardiomegaly, with bilateral hilar vascular congestion with bilateral interstitial infiltrates  - Admitted, started on diuretics  Subjective: Feels fair overall, breathing is improving  Assessment and Plan:  Acute on chronic diastolic CHF -Echo limited views, grossly preserved EF 60 to 65%, mild LVH, RV not well visualized, trivial pericardial effusion.  -Lactic acid trended down -Improving with diuresis, weight down 34 LB -Discussed with cards, residual edema could be secondary to hypoalbuminemia and third spacing transitioning to oral torsemide  today -As she is on warfarin, defer RHC at this time - Cards following, GDMT limited by CKD, poor candidate for SGLT2i - Discharge planning, TOC following for placement  Elevated troponin  - Secondary to demand, type II MI due to heart failure. No ACS.  Acute hypoxemic respiratory failure due to acute cardiogenic pulmonary edema -as above Pneumonia ruled out - Also suspect she has undiagnosed OSA/OHS, would benefit from sleep study after discharge  Congestive hepatopathy, continue diuresis  AST and ALT are trending down.   Persistent atrial fibrillation (HCC) Ventricular paced rhythm.  -Continue metoprolol   discontinue amiodarone  due to elevation in LFT  Anticoagulated with warfarin, unable to afford direct oral anticoagulants.   Acute renal failure superimposed on stage 3b chronic kidney disease (HCC) Hyperkalemia.  - Improving, likely cardiorenal, baseline creatinine around 1.5-1.7, creatinine was 3.01 on admission,  now improving  Thrombocytopenia (HCC) Cell count stable.   Type 2 diabetes mellitus with hyperlipidemia (HCC) -Stable, SSI  COPD (chronic obstructive pulmonary disease) (HCC) No signs of acute exacerbation, continue with bronchodilator therapy Continue 02 monitoring and supplemental 02 to keep 02 saturation 92% or greater.,  On ICS and LABA.   Restless leg syndrome Decreased Requip  dose  Obesity, class 3 Calculated BMI is 56.1    DVT prophylaxis: warfarin Code Status: Full Code Family Communication: None present Disposition Plan: Plan for rehab, possibly 1 to 2 days  Consultants:    Procedures:   Antimicrobials:    Objective: Vitals:   12/28/23 0027 12/28/23 0457 12/28/23 0754 12/28/23 0832  BP: (!) 113/98 (!) 103/49 (!) 111/42   Pulse: 65 61 65   Resp: 20 19 20    Temp: 97.9 F (36.6 C) 97.8 F (36.6 C) 97.6 F (36.4 C)   TempSrc: Axillary Oral Axillary   SpO2: 93% 91% 92% 95%  Weight:  127.9 kg    Height:        Intake/Output Summary (Last 24 hours) at 12/28/2023 0959 Last data filed at 12/28/2023 0507 Gross per 24 hour  Intake 357 ml  Output 1175 ml  Net -818 ml   Filed Weights   12/25/23 2356 12/27/23 0536 12/28/23 0457  Weight: 135.6 kg 128.8 kg 127.9 kg    Examination:  Obese chronically ill female laying in bed, AAO x 2, no distress HEENT: Neck obese unable to assess JVD CVS: S1-S2, regular rhythm Lungs: Decreased breath sounds Abdomen: Soft, nontender, bowel sounds present Extremities: Edema in lateral flanks, 1+ in lower legs  Data Reviewed:   CBC: Recent Labs  Lab 12/22/23 0251 12/23/23 0313  12/25/23 0751 12/26/23 0249 12/27/23 0249  WBC 5.8 5.5 5.4 4.5 4.5  HGB 13.0 12.1 12.9 12.1 12.3  HCT 44.8 41.9 45.0 41.3 42.6  MCV 91.2 92.5 93.6 92.0 92.2  PLT 115* 79* 59* 59* 67*   Basic Metabolic Panel: Recent Labs  Lab 12/24/23 0330 12/25/23 0324 12/26/23 0249 12/27/23 0249 12/28/23 0241  NA 138 139 141 142 140  K 4.3 3.9  3.5 3.6 3.6  CL 94* 92* 89* 90* 92*  CO2 37* 40* 41* 43* 36*  GLUCOSE 125* 104* 128* 142* 159*  BUN 52* 51* 46* 44* 39*  CREATININE 2.36* 2.19* 1.94* 1.79* 1.81*  CALCIUM  8.2* 8.2* 8.4* 8.7* 8.3*  MG 2.2 2.0 1.8 2.0 2.0   GFR: Estimated Creatinine Clearance: 32.3 mL/min (A) (by C-G formula based on SCr of 1.81 mg/dL (H)). Liver Function Tests: Recent Labs  Lab 12/24/23 0330 12/25/23 0324 12/26/23 0249 12/27/23 0249 12/28/23 0241  AST 67* 38 26 22 20   ALT 313* 201* 134* 101* 71*  ALKPHOS 83 64 53 58 53  BILITOT 1.2 1.3* 1.3* 1.1 1.0  PROT 5.5* 5.2* 4.8* 5.1* 4.9*  ALBUMIN 3.0* 2.7* 2.5* 2.6* 2.4*   No results for input(s): LIPASE, AMYLASE in the last 168 hours.  No results for input(s): AMMONIA in the last 168 hours. Coagulation Profile: Recent Labs  Lab 12/25/23 0751 12/26/23 0249 12/27/23 0249 12/28/23 0241  INR 1.4* 1.4* 1.7* 2.2*   Cardiac Enzymes: No results for input(s): CKTOTAL, CKMB, CKMBINDEX, TROPONINI in the last 168 hours. BNP (last 3 results) No results for input(s): PROBNP in the last 8760 hours. HbA1C: No results for input(s): HGBA1C in the last 72 hours. CBG: Recent Labs  Lab 12/27/23 0603 12/27/23 1043 12/27/23 1539 12/27/23 2111 12/28/23 0620  GLUCAP 107* 220* 188* 244* 145*   Lipid Profile: No results for input(s): CHOL, HDL, LDLCALC, TRIG, CHOLHDL, LDLDIRECT in the last 72 hours. Thyroid  Function Tests: No results for input(s): TSH, T4TOTAL, FREET4, T3FREE, THYROIDAB in the last 72 hours. Anemia Panel: No results for input(s): VITAMINB12, FOLATE, FERRITIN, TIBC, IRON, RETICCTPCT in the last 72 hours. Urine analysis:    Component Value Date/Time   COLORURINE YELLOW 07/15/2022 2012   APPEARANCEUR CLOUDY (A) 07/15/2022 2012   LABSPEC 1.014 07/15/2022 2012   PHURINE 5.0 07/15/2022 2012   GLUCOSEU >=500 (A) 07/15/2022 2012   HGBUR SMALL (A) 07/15/2022 2012   BILIRUBINUR NEGATIVE  07/15/2022 2012   KETONESUR NEGATIVE 07/15/2022 2012   PROTEINUR NEGATIVE 07/15/2022 2012   NITRITE NEGATIVE 07/15/2022 2012   LEUKOCYTESUR LARGE (A) 07/15/2022 2012   Sepsis Labs: @LABRCNTIP (procalcitonin:4,lacticidven:4)  ) Recent Results (from the past 240 hours)  Resp panel by RT-PCR (RSV, Flu A&B, Covid) Anterior Nasal Swab     Status: None   Collection Time: 12/20/23  6:07 PM   Specimen: Anterior Nasal Swab  Result Value Ref Range Status   SARS Coronavirus 2 by RT PCR NEGATIVE NEGATIVE Final   Influenza A by PCR NEGATIVE NEGATIVE Final   Influenza B by PCR NEGATIVE NEGATIVE Final    Comment: (NOTE) The Xpert Xpress SARS-CoV-2/FLU/RSV plus assay is intended as an aid in the diagnosis of influenza from Nasopharyngeal swab specimens and should not be used as a sole basis for treatment. Nasal washings and aspirates are unacceptable for Xpert Xpress SARS-CoV-2/FLU/RSV testing.  Fact Sheet for Patients: BloggerCourse.com  Fact Sheet for Healthcare Providers: SeriousBroker.it  This test is not yet approved or cleared by the [place]  FDA and has been  authorized for detection and/or diagnosis of SARS-CoV-2 by FDA under an Emergency Use Authorization (EUA). This EUA will remain in effect (meaning this test can be used) for the duration of the COVID-19 declaration under Section 564(b)(1) of the Act, 21 U.S.C. section 360bbb-3(b)(1), unless the authorization is terminated or revoked.     Resp Syncytial Virus by PCR NEGATIVE NEGATIVE Final    Comment: (NOTE) Fact Sheet for Patients: BloggerCourse.com  Fact Sheet for Healthcare Providers: SeriousBroker.it  This test is not yet approved or cleared by the [place]  FDA and has been authorized for detection and/or diagnosis of SARS-CoV-2 by FDA under an Emergency Use Authorization (EUA). This EUA will remain in effect  (meaning this test can be used) for the duration of the COVID-19 declaration under Section 564(b)(1) of the Act, 21 U.S.C. section 360bbb-3(b)(1), unless the authorization is terminated or revoked.  Performed at [facility], [address], [place], [place] [address]   Blood culture (routine x 2)     Status: Abnormal   Collection Time: 12/20/23  7:05 PM   Specimen: BLOOD RIGHT HAND  Result Value Ref Range Status   Specimen Description BLOOD RIGHT HAND  Final   Special Requests   Final    BOTTLES DRAWN AEROBIC AND ANAEROBIC Blood Culture adequate volume   Culture  Setup Time   Final    GRAM POSITIVE RODS AEROBIC BOTTLE ONLY CRITICAL RESULT CALLED TO, READ BACK BY AND VERIFIED WITH: PHARMD JESSICA MILLEN [id number] BY J RAZZAK, MT    Culture (A)  Final    DIPHTHEROIDS(CORYNEBACTERIUM SPECIES) Standardized susceptibility testing for this organism is not available. Performed at [facility], [address], [place], [place] [address]    Report Status 12/24/2023 FINAL  Final  Blood culture (routine x 2)     Status: Abnormal   Collection Time: 12/20/23  9:23 PM   Specimen: BLOOD LEFT ARM  Result Value Ref Range Status   Specimen Description BLOOD LEFT ARM  Final   Special Requests   Final    BOTTLES DRAWN AEROBIC AND ANAEROBIC Blood Culture results may not be optimal due to an inadequate volume of blood received in culture bottles   Culture  Setup Time   Final    GRAM POSITIVE COCCI IN CLUSTERS ANAEROBIC BOTTLE ONLY CRITICAL RESULT CALLED TO, READ BACK BY AND VERIFIED WITH: PHARMD LISA CURRAN ON 12/21/23 @ 1826 BY DRT    Culture (A)  Final    STAPHYLOCOCCUS EPIDERMIDIS THE SIGNIFICANCE OF ISOLATING THIS ORGANISM FROM A SINGLE SET OF BLOOD CULTURES WHEN MULTIPLE SETS ARE DRAWN IS UNCERTAIN. PLEASE NOTIFY THE MICROBIOLOGY DEPARTMENT WITHIN ONE WEEK IF SPECIATION AND SENSITIVITIES ARE REQUIRED. Performed at [facility], [address] [place], [place]  [address]    Report Status 12/23/2023 FINAL  Final  Blood Culture ID Panel (Reflexed)     Status: Abnormal   Collection Time: 12/20/23  9:23 PM  Result Value Ref Range Status   Enterococcus faecalis NOT DETECTED NOT DETECTED Final   Enterococcus Faecium NOT DETECTED NOT DETECTED Final   Listeria monocytogenes NOT DETECTED NOT DETECTED Final   Staphylococcus species DETECTED (A) NOT DETECTED Final    Comment: CRITICAL RESULT CALLED TO, READ BACK BY AND VERIFIED WITH: PHARMD LISA CURRAN ON 12/21/23 @ 1826 BY DRT    Staphylococcus aureus (BCID) NOT DETECTED NOT DETECTED Final   Staphylococcus epidermidis DETECTED (A) NOT DETECTED Final    Comment: Methicillin (oxacillin) resistant coagulase negative  staphylococcus. Possible blood culture contaminant (unless isolated from more than one blood culture draw or clinical case suggests pathogenicity). No antibiotic treatment is indicated for blood  culture contaminants. CRITICAL RESULT CALLED TO, READ BACK BY AND VERIFIED WITH: PHARMD LISA CURRAN ON 12/21/23 @ 1826 BY DRT    Staphylococcus lugdunensis NOT DETECTED NOT DETECTED Final   Streptococcus species NOT DETECTED NOT DETECTED Final   Streptococcus agalactiae NOT DETECTED NOT DETECTED Final   Streptococcus pneumoniae NOT DETECTED NOT DETECTED Final   Streptococcus pyogenes NOT DETECTED NOT DETECTED Final   A.calcoaceticus-baumannii NOT DETECTED NOT DETECTED Final   Bacteroides fragilis NOT DETECTED NOT DETECTED Final   Enterobacterales NOT DETECTED NOT DETECTED Final   Enterobacter cloacae complex NOT DETECTED NOT DETECTED Final   Escherichia coli NOT DETECTED NOT DETECTED Final   Klebsiella aerogenes NOT DETECTED NOT DETECTED Final   Klebsiella oxytoca NOT DETECTED NOT DETECTED Final   Klebsiella pneumoniae NOT DETECTED NOT DETECTED Final   Proteus species NOT DETECTED NOT DETECTED Final   Salmonella species NOT DETECTED NOT DETECTED Final   Serratia marcescens NOT DETECTED NOT DETECTED  Final   Haemophilus influenzae NOT DETECTED NOT DETECTED Final   Neisseria meningitidis NOT DETECTED NOT DETECTED Final   Pseudomonas aeruginosa NOT DETECTED NOT DETECTED Final   Stenotrophomonas maltophilia NOT DETECTED NOT DETECTED Final   Candida albicans NOT DETECTED NOT DETECTED Final   Candida auris NOT DETECTED NOT DETECTED Final   Candida glabrata NOT DETECTED NOT DETECTED Final   Candida krusei NOT DETECTED NOT DETECTED Final   Candida parapsilosis NOT DETECTED NOT DETECTED Final   Candida tropicalis NOT DETECTED NOT DETECTED Final   Cryptococcus neoformans/gattii NOT DETECTED NOT DETECTED Final   Methicillin resistance mecA/C DETECTED (A) NOT DETECTED Final    Comment: CRITICAL RESULT CALLED TO, READ BACK BY AND VERIFIED WITH: PHARMD LISA CURRAN ON 12/21/23 @ 1826 BY DRT Performed at [facility], [address], [place], [place] [address]      Radiology Studies: No results found.   Scheduled Meds:  amiodarone   200 mg Oral Daily   benzonatate  100 mg Oral TID   ferrous sulfate  325 mg Oral QHS   fluticasone  furoate-vilanterol  1 puff Inhalation Daily   gabapentin   300 mg Oral QHS   Gerhardt's butt cream   Topical BID   insulin  aspart  0-9 Units Subcutaneous TID WC   metoprolol  succinate  25 mg Oral Daily   nystatin   Topical BID   rOPINIRole   2 mg Oral q AM   sodium chloride  flush  3 mL Intravenous Q12H   torsemide   60 mg Oral Daily   Warfarin - Pharmacist Dosing Inpatient   Does not apply q1600   Continuous Infusions:   LOS: 8 days    Time spent:    Deforest Fast, MD [facility]   12/28/2023, 9:59 AM

## 2023-12-28 NOTE — TOC Progression Note (Addendum)
 Transition of Care [facility]) - Progression Note    Patient Details  Name: Holly Spence MRN: [id number] Date of Birth: 1946-02-12  [facility] of [facility]) CM/SW Contact  Arron Big, [place] Phone Number: 12/28/2023, 8:55 AM  Clinical Narrative:   CSW met with patient on 6/19, patient chose [facility] re[facility]. Facility can accept patient on Eliquis .   11:25 AM Insurance auth approved, dates 12/29/2023-01/01/2024; [employer] id [id number].   Per facility, they can accept patient tomorrow. Weekend contact for [employer] is Lauren [phone]). Room number is 21, call to report [phone] (nurse can ask for Olla on [address] hall).   TOC will continue to follow.    Expected Discharge Plan: Skilled Nursing Facility Barriers to Discharge: Continued Medical Work up, English as a second language teacher  Expected Discharge Plan and Services In-house Referral: Clinical Social Work Discharge Planning Services: CM Consult   Living arrangements for the past 2 months: Single Family Home                                       Social Determinants of Health (SDOH) Interventions SDOH Screenings   Food Insecurity: No Food Insecurity (12/20/2023)  Housing: Unknown (12/20/2023)  Transportation Needs: No Transportation Needs (12/20/2023)  Utilities: Not At Risk (12/20/2023)  Depression (PHQ2-9): Low Risk  (02/03/2019)  Social Connections: Moderately Isolated (12/24/2023)  Tobacco Use: Medium Risk (12/20/2023)    Readmission Risk Interventions    12/21/2023    4:14 PM 12/30/2022    3:47 PM  Readmission Risk Prevention Plan  Transportation Screening Complete Complete  PCP or Specialist Appt within 5-7 Days  Complete  Home Care Screening Complete Complete  Medication Review (RN CM) Complete Complete

## 2023-12-28 NOTE — Progress Notes (Signed)
 Occupational Therapy Treatment Patient Details Name: Holly Spence MRN: [id number] DOB: [birth year] Today's Date: 12/28/2023   History of present illness Patient is 78 yo female admitted on 12/20/23 for acute hypoxic respiratory failure and acute on chronic diastolic heart failure. PMH significant for T2DM, Afib, HTN, HTN, OSA, PVD, s/p pacemaker placement, GERD, COPD, and LBP.   OT comments  Patient received in supine and stating she felt weak and tired and believes it's due to lack of sleep. Patient instructed on bed mobility and mod assist to get to EOB. Patient stood from EOB with mod assist and stated she felt dizzy and returned to EOB but BP was stable. Patient stood again with mod assist and mod assist to step pivot transfer to recliner with RW. Patient able to perform grooming tasks in sitting. Patient will benefit from continued inpatient follow up therapy, <3 hours/day upon discharge. Acute OT to continue to follow to address established goals to facilitate DC to next venue of care.         If plan is discharge home, recommend the following:  A lot of help with walking and/or transfers;A lot of help with bathing/dressing/bathroom;Assist for transportation;Assistance with cooking/housework;Help with stairs or ramp for entrance   Equipment Recommendations  Other (comment) (TBD next venue of care)    Recommendations for Other Services      Precautions / Restrictions Precautions Precautions: Fall Recall of Precautions/Restrictions: Impaired Restrictions Weight Bearing Restrictions Per Provider Order: No       Mobility Bed Mobility Overal bed mobility: Needs Assistance Bed Mobility: Supine to Sit     Supine to sit: Mod assist, HOB elevated, Used rails     General bed mobility comments: increased time with assistance to lift trunk and to scoot to EOB    Transfers Overall transfer level: Needs assistance Equipment used: Rolling walker (2 wheels) Transfers: Sit to/from  Stand, Bed to chair/wheelchair/BSC Sit to Stand: Mod assist     Step pivot transfers: Mod assist     General transfer comment: cues for hand placement. required 2 attempts to stand before performing transfer with assistance to steady     Balance Overall balance assessment: Needs assistance Sitting-balance support: Single extremity supported, Feet supported Sitting balance-Leahy Scale: Fair Sitting balance - Comments: Pt sat EOB with supervision   Standing balance support: Bilateral upper extremity supported, During functional activity, Reliant on assistive device for balance Standing balance-Leahy Scale: Poor Standing balance comment: reliant on RW for support                           ADL either performed or assessed with clinical judgement   ADL Overall ADL's : Needs assistance/impaired     Grooming: Wash/dry hands;Wash/dry face;Oral care;Brushing hair;Set up;Sitting Grooming Details (indicate cue type and reason): in recliner             Lower Body Dressing: Maximal assistance;Sitting/lateral leans Lower Body Dressing Details (indicate cue type and reason): for socks Toilet Transfer: Moderate assistance;Rolling walker (2 wheels) Toilet Transfer Details (indicate cue type and reason): simulated           General ADL Comments: self care performed in sitting due to patient stating she felt weak    Extremity/Trunk Assessment Upper Extremity Assessment Upper Extremity Assessment: LUE deficits/detail LUE Deficits / Details: History of shoulder impairment with shoulder flexion AROM approximately 0-60 degrees and AAROM 0-90 degrees with report of pain.  Limited internal and external rotation.  Elbow  and hand AROM WFLs with strength at 4/5. RUE AROM WFLs with strength at 3+/5. LUE Sensation: WNL LUE Coordination: WNL            Vision       Perception     Praxis     Communication Communication Communication: No apparent difficulties   Cognition  Arousal: Alert Behavior During Therapy: Anxious Cognition: No family/caregiver present to determine baseline                               Following commands: Intact        Cueing   Cueing Techniques: Verbal cues, Tactile cues  Exercises      Shoulder Instructions       General Comments SpO2 in low 90's on 1 liter    Pertinent Vitals/ Pain       Pain Assessment Pain Assessment: Faces Faces Pain Scale: Hurts even more Pain Location: Back and Knees Pain Descriptors / Indicators: Discomfort, Aching Pain Intervention(s): Limited activity within patient's tolerance, Monitored during session, Repositioned  Home Living                                          Prior Functioning/Environment              Frequency  Min 1X/week        Progress Toward Goals  OT Goals(current goals can now be found in the care plan section)  Progress towards OT goals: Progressing toward goals  Acute Rehab OT Goals Patient Stated Goal: to get stronger OT Goal Formulation: With patient Time For Goal Achievement: 01/05/24 Potential to Achieve Goals: Good ADL Goals Pt Will Perform Lower Body Bathing: with mod assist;sit to/from stand;with adaptive equipment Pt Will Perform Lower Body Dressing: with mod assist;sit to/from stand;with adaptive equipment Pt Will Transfer to Toilet: with mod assist;stand pivot transfer;bedside commode Pt Will Perform Toileting - Clothing Manipulation and hygiene: with max assist;sit to/from stand;with adaptive equipment Additional ADL Goal #1: Pt will transfer from supine to sit EOB with mod assist and HOB elevated at 25 degrees, in preparation for selfcare tasks.  Plan      Co-evaluation                 AM-PAC OT 6 Clicks Daily Activity     Outcome Measure   Help from another person eating meals?: None Help from another person taking care of personal grooming?: A Little Help from another person toileting, which  includes using toliet, bedpan, or urinal?: A Lot Help from another person bathing (including washing, rinsing, drying)?: A Lot Help from another person to put on and taking off regular upper body clothing?: A Lot Help from another person to put on and taking off regular lower body clothing?: A Lot 6 Click Score: 15    End of Session Equipment Utilized During Treatment: Gait belt;Rolling walker (2 wheels);Oxygen  OT Visit Diagnosis: Unsteadiness on feet (R26.81);Other abnormalities of gait and mobility (R26.89);Repeated falls (R29.6);Muscle weakness (generalized) (M62.81)   Activity Tolerance Patient tolerated treatment well   Patient Left in chair;with call bell/phone within reach;with chair alarm set   Nurse Communication Mobility status        Time: 8119-1478 OT Time Calculation (min): 30 min  Charges: OT General Charges $OT Visit: 1 Visit OT Treatments $Self Care/Home Management :  8-22 mins $Therapeutic Activity: 8-22 mins  Anitra Barn, OTA Acute Rehabilitation Services  Office [phone]   Jovita Nipper 12/28/2023, 2:28 PM

## 2023-12-28 NOTE — Discharge Instructions (Signed)
Information on my medicine - Coumadin   (Warfarin)  This medication education was reviewed with me or my healthcare representative as part of my discharge preparation.   Why was Coumadin prescribed for you? Coumadin was prescribed for you because you have a blood clot or a medical condition that can cause an increased risk of forming blood clots. Blood clots can cause serious health problems by blocking the flow of blood to the heart, lung, or brain. Coumadin can prevent harmful blood clots from forming. As a reminder your indication for Coumadin is:  Stroke Prevention because of Atrial Fibrillation  What test will check on my response to Coumadin? While on Coumadin (warfarin) you will need to have an INR test regularly to ensure that your dose is keeping you in the desired range. The INR (international normalized ratio) number is calculated from the result of the laboratory test called prothrombin time (PT).  If an INR APPOINTMENT HAS NOT ALREADY BEEN MADE FOR YOU please schedule an appointment to have this lab work done by your health care provider within 7 days. Your INR goal is a number between:  2 to 3   What  do you need to  know  About  COUMADIN? Take Coumadin (warfarin) exactly as prescribed by your healthcare provider about the same time each day.  DO NOT stop taking without talking to the doctor who prescribed the medication.  Stopping without other blood clot prevention medication to take the place of Coumadin may increase your risk of developing a new clot or stroke.  Get refills before you run out.  What do you do if you miss a dose? If you miss a dose, take it as soon as you remember on the same day then continue your regularly scheduled regimen the next day.  Do not take two doses of Coumadin at the same time.  Important Safety Information A possible side effect of Coumadin (Warfarin) is an increased risk of bleeding. You should call your healthcare provider right away if you  experience any of the following: Bleeding from an injury or your nose that does not stop. Unusual colored urine (red or dark brown) or unusual colored stools (red or black). Unusual bruising for unknown reasons. A serious fall or if you hit your head (even if there is no bleeding).  Some foods or medicines interact with Coumadin (warfarin) and might alter your response to warfarin. To help avoid this: Eat a balanced diet, maintaining a consistent amount of Vitamin K. Notify your provider about major diet changes you plan to make. Avoid alcohol or limit your intake to 1 drink for women and 2 drinks for men per day. (1 drink is 5 oz. wine, 12 oz. beer, or 1.5 oz. liquor.)  Make sure that ANY health care provider who prescribes medication for you knows that you are taking Coumadin (warfarin).  Also make sure the healthcare provider who is monitoring your Coumadin knows when you have started a new medication including herbals and non-prescription products.  Coumadin (Warfarin)  Major Drug Interactions  Increased Warfarin Effect Decreased Warfarin Effect  Alcohol (large quantities) Antibiotics (esp. Septra/Bactrim, Flagyl, Cipro) Amiodarone (Cordarone) Aspirin (ASA) Cimetidine (Tagamet) Megestrol (Megace) NSAIDs (ibuprofen, naproxen, etc.) Piroxicam (Feldene) Propafenone (Rythmol SR) Propranolol (Inderal) Isoniazid (INH) Posaconazole (Noxafil) Barbiturates (Phenobarbital) Carbamazepine (Tegretol) Chlordiazepoxide (Librium) Cholestyramine (Questran) Griseofulvin Oral Contraceptives Rifampin Sucralfate (Carafate) Vitamin K   Coumadin (Warfarin) Major Herbal Interactions  Increased Warfarin Effect Decreased Warfarin Effect  Garlic Ginseng Ginkgo biloba Coenzyme Q10 Green   tea St. John's wort    Coumadin (Warfarin) FOOD Interactions  Eat a consistent number of servings per week of foods HIGH in Vitamin K (1 serving =  cup)  Collards (cooked, or boiled & drained) Kale  (cooked, or boiled & drained) Mustard greens (cooked, or boiled & drained) Parsley *serving size only =  cup Spinach (cooked, or boiled & drained) Swiss chard (cooked, or boiled & drained) Turnip greens (cooked, or boiled & drained)  Eat a consistent number of servings per week of foods MEDIUM-HIGH in Vitamin K (1 serving = 1 cup)  Asparagus (cooked, or boiled & drained) Broccoli (cooked, boiled & drained, or raw & chopped) Brussel sprouts (cooked, or boiled & drained) *serving size only =  cup Lettuce, raw (green leaf, endive, romaine) Spinach, raw Turnip greens, raw & chopped   These websites have more information on Coumadin (warfarin):  www.coumadin.com; www.ahrq.gov/consumer/coumadin.htm;   

## 2023-12-28 NOTE — Plan of Care (Signed)
   Problem: Coping: Goal: Ability to adjust to condition or change in health will improve Outcome: Progressing

## 2023-12-28 NOTE — Progress Notes (Signed)
 Heart Failure Navigator Progress Note  Assessed for Heart & Vascular TOC clinic readiness.  Patient does not meet criteria due to EF 60-65%, has a scheduled [facility] appointment on 01/01/2024. No HF TOC. .   Navigator will sign off at this time.   [name], BSN, Scientist, clinical (histocompatibility and immunogenetics) Only

## 2023-12-29 DIAGNOSIS — I5033 Acute on chronic diastolic (congestive) heart failure: Secondary | ICD-10-CM | POA: Diagnosis not present

## 2023-12-29 LAB — BASIC METABOLIC PANEL WITH GFR
Anion gap: 6 (ref 5–15)
BUN: 38 mg/dL — ABNORMAL HIGH (ref 8–23)
CO2: 40 mmol/L — ABNORMAL HIGH (ref 22–32)
Calcium: 8.3 mg/dL — ABNORMAL LOW (ref 8.9–10.3)
Chloride: 95 mmol/L — ABNORMAL LOW (ref 98–111)
Creatinine, Ser: 1.82 mg/dL — ABNORMAL HIGH (ref 0.44–1.00)
GFR, Estimated: 28 mL/min — ABNORMAL LOW (ref >60)
Glucose, Bld: 129 mg/dL — ABNORMAL HIGH (ref 70–99)
Potassium: 3.4 mmol/L — ABNORMAL LOW (ref 3.5–5.1)
Sodium: 141 mmol/L (ref 135–145)

## 2023-12-29 LAB — PROTIME-INR
INR: 2.8 — ABNORMAL HIGH (ref 0.8–1.2)
Prothrombin Time: 29.9 s — ABNORMAL HIGH (ref 11.4–15.2)

## 2023-12-29 LAB — GLUCOSE, CAPILLARY
Glucose-Capillary: 134 mg/dL — ABNORMAL HIGH (ref 70–99)
Glucose-Capillary: 182 mg/dL — ABNORMAL HIGH (ref 70–99)

## 2023-12-29 LAB — MAGNESIUM: Magnesium: 2 mg/dL (ref 1.7–2.4)

## 2023-12-29 MED ORDER — POTASSIUM CHLORIDE 20 MEQ PO PACK
40.0000 meq | PACK | Freq: Two times a day (BID) | ORAL | Status: DC
  Administered 2023-12-29: 40 meq via ORAL
  Filled 2023-12-29: qty 2

## 2023-12-29 MED ORDER — INSULIN NPH ISOPHANE & REGULAR (70-30) 100 UNIT/ML ~~LOC~~ SUSP: 25.0000 [IU] | Freq: Two times a day (BID) | SUBCUTANEOUS | Status: DC

## 2023-12-29 MED ORDER — POTASSIUM CHLORIDE 20 MEQ PO PACK: 40.0000 meq | PACK | Freq: Every day | ORAL | Status: DC

## 2023-12-29 MED ORDER — TRAMADOL HCL 50 MG PO TABS: 100.0000 mg | ORAL_TABLET | Freq: Two times a day (BID) | ORAL | 0 refills | Status: DC | PRN

## 2023-12-29 MED ORDER — GABAPENTIN 300 MG PO CAPS: 300.0000 mg | ORAL_CAPSULE | Freq: Every day | ORAL | Status: DC

## 2023-12-29 MED ORDER — ROPINIROLE HCL 2 MG PO TABS: 2.0000 mg | ORAL_TABLET | Freq: Every day | ORAL | Status: DC

## 2023-12-29 MED ORDER — WARFARIN SODIUM 5 MG PO TABS: 5.0000 mg | ORAL_TABLET | Freq: Every day | ORAL | Status: DC

## 2023-12-29 MED ORDER — WARFARIN SODIUM 2 MG PO TABS: 4.0000 mg | ORAL_TABLET | Freq: Once | ORAL | Status: DC

## 2023-12-29 MED ORDER — FLUTICASONE FUROATE-VILANTEROL 200-25 MCG/ACT IN AEPB: 1.0000 | INHALATION_SPRAY | Freq: Every day | RESPIRATORY_TRACT | Status: DC

## 2023-12-29 NOTE — Discharge Summary (Signed)
 Physician Discharge Summary  Holly Spence FMW:[id number] DOB: 08-07-1945 DOA: 12/20/2023  PCP: [facility]  Admit date: 12/20/2023 Discharge date: 12/29/2023  Time spent: 45 minutes  Recommendations for Outpatient Follow-up:  PCP in 1 week, recommend sleep study as outpatient Cardiology Dr. Edwyna on 6/24, check BMP at follow-up   Discharge Diagnoses:  Principal Problem:   Acute on chronic diastolic CHF (congestive heart failure) (HCC) Active Problems:   Persistent atrial fibrillation (HCC)   Essential hypertension   Acute renal failure superimposed on stage 3b chronic kidney disease (HCC)   Thrombocytopenia (HCC)   Type 2 diabetes mellitus with hyperlipidemia (HCC)   COPD (chronic obstructive pulmonary disease) (HCC)   Restless leg syndrome   Obesity, class 3   Discharge Condition: Improved  Diet recommendation: Diabetic, low-sodium, heart healthy  Filed Weights   12/27/23 0536 12/28/23 0457 12/29/23 0410  Weight: 128.8 kg 127.9 kg 130.2 kg    History of present illness:  78/F w history of atrial fibrillation, T2DM, COPD,CKD, obesity and diastolic CHF presented with dyspnea x 3 wks associated with lower extremity edema, and non productive cough.  In ED, + edema,  bun 34 cr 3,0 , AST 1,744 ALT 844 , BNP 1,019 , troponin 410 and 422 Lactic acid 4,9, 3,6 and 1,9   CXR wpositive cardiomegaly, with bilateral hilar vascular congestion with bilateral interstitial infiltrates  - Admitted, started on diuretics  Hospital Course:  Acute on chronic diastolic CHF -Echo limited views, grossly preserved EF 60 to 65%, mild LVH, RV not well visualized, trivial pericardial effusion.  -Lactic acid trended down -Improving with diuresis, weight down 34 LB -Discussed with cards, residual edema could be secondary to hypoalbuminemia and third spacing transitioned to oral torsemide  today 60mg  daily - Cards following, GDMT limited by CKD, poor candidate for SGLT2i -  Discharge to SNF for short-term rehab, follow-up with Dr. Edwyna on 6/24  Morbid obesity, suspected OSA/OHS -Recommend sleep study as outpatient   Elevated troponin  - Secondary to demand, type II MI due to heart failure. No ACS.   Acute hypoxemic respiratory failure due to acute cardiogenic pulmonary edema -as above Pneumonia ruled out - Also suspect she has undiagnosed OSA/OHS, would benefit from sleep study after discharge   Congestive hepatopathy, continue diuresis  AST and ALT are trending down.    Persistent atrial fibrillation (HCC) Ventricular paced rhythm.  -Continue metoprolol   -Temporarily held amiodarone  with elevated LFTs, now improving, resumed low-dose amiodarone   -Anticoagulated with warfarin, unable to afford direct oral anticoagulants.    Acute renal failure superimposed on stage 3b chronic kidney disease (HCC) Hyperkalemia.  - Improving, likely cardiorenal, baseline creatinine around 1.5-1.7, creatinine was 3.01 on admission, now improving, creatinine 1.8 at discharge   Thrombocytopenia (HCC) Cell count stable.    Type 2 diabetes mellitus with hyperlipidemia (HCC) - Resumed 70/30 insulin  at lower dose   COPD (chronic obstructive pulmonary disease) (HCC) No signs of acute exacerbation, continue with bronchodilator therapy Continue supplemental home O2 On ICS and LABA.    Restless leg syndrome Decreased Requip  dose   Obesity, class 3 Calculated BMI is 56.1   Discharge Exam: Vitals:   12/29/23 0756 12/29/23 0838  BP: (!) 90/36 (!) 115/45  Pulse: 60 63  Resp: 18 16  Temp: 98 F (36.7 C)   SpO2: 93% 93%    Obese chronically ill female laying in bed, AAO x 2, no distress HEENT: Neck obese unable to assess JVD CVS: S1-S2, regular rhythm Lungs: Decreased  breath sounds Abdomen: Soft, nontender, bowel sounds present Extremities: trace Edema in lateral flanks,  lower legs Discharge Instructions   Discharge Instructions     Diet - low sodium  heart healthy   Complete by: As directed    Discharge wound care:   Complete by: As directed    routine   Increase activity slowly   Complete by: As directed       Allergies as of 12/29/2023       Reactions   Penicillins Hives, Other (See Comments)   At injection site only- hives   Sulfa Antibiotics Hives   Erythromycin Other (See Comments)   Unsure    Azithromycin Rash, Other (See Comments)   Broke out the legs   Ciprofloxacin Hcl Other (See Comments)   Abdominal Pain        Medication List     TAKE these medications    albuterol  108 (90 Base) MCG/ACT inhaler Commonly known as: VENTOLIN  HFA Inhale 2 puffs into the lungs every 6 (six) hours as needed for wheezing or shortness of breath.   amiodarone  200 MG tablet Commonly known as: PACERONE  Take 1 tablet (200 mg total) by mouth daily. What changed: when to take this   cyanocobalamin 1000 MCG tablet Take 1,000 mcg by mouth in the morning.   diclofenac sodium 1 % Gel Commonly known as: VOLTAREN Apply 2-4 g topically every 6 (six) hours as needed (pain).   ferrous sulfate  325 (65 FE) MG EC tablet Take 325 mg by mouth at bedtime.   fluticasone  furoate-vilanterol 200-25 MCG/ACT Aepb Commonly known as: BREO ELLIPTA  Inhale 1 puff into the lungs daily. Start taking on: December 30, 2023   gabapentin  300 MG capsule Commonly known as: NEURONTIN  Take 1 capsule (300 mg total) by mouth at bedtime. What changed: how much to take   insulin  NPH-regular Human (70-30) 100 UNIT/ML injection Inject 25 Units into the skin 2 (two) times daily with a meal. Per sliding scale What changed: how much to take   liver oil-zinc  oxide 40 % ointment Commonly known as: DESITIN Apply topically 2 (two) times daily. What changed:  how much to take when to take this reasons to take this   metoprolol  succinate 25 MG 24 hr tablet Commonly known as: Toprol  XL Take 1 tablet (25 mg total) by mouth daily. What changed: when to take this    pantoprazole  40 MG tablet Commonly known as: PROTONIX  Take 40 mg by mouth daily before breakfast.   potassium chloride  20 MEQ packet Commonly known as: KLOR-CON  Take 40 mEq by mouth daily.   rOPINIRole  2 MG tablet Commonly known as: REQUIP  Take 1 tablet (2 mg total) by mouth at bedtime. What changed:  medication strength how much to take when to take this   torsemide  20 MG tablet Commonly known as: DEMADEX  Take 3 tablets (60 mg total) by mouth daily. What changed: when to take this   traMADol  50 MG tablet Commonly known as: ULTRAM  Take 2 tablets (100 mg total) by mouth every 12 (twelve) hours as needed. What changed:  when to take this reasons to take this               Discharge Care Instructions  (From admission, onward)           Start     Ordered   12/29/23 0000  Discharge wound care:       Comments: routine   12/29/23 1007  Allergies  Allergen Reactions   Penicillins Hives and Other (See Comments)    At injection site only- hives   Sulfa Antibiotics Hives   Erythromycin Other (See Comments)    Unsure    Azithromycin Rash and Other (See Comments)    Broke out the legs   Ciprofloxacin Hcl Other (See Comments)    Abdominal Pain    Contact information for after-discharge care     Destination     [facility] .   Service: Skilled Nursing Contact information: [address] [place]  [phone]                      The results of significant diagnostics from this hospitalization (including imaging, microbiology, ancillary and laboratory) are listed below for reference.    Significant Diagnostic Studies: ECHOCARDIOGRAM COMPLETE Result Date: 12/21/2023    ECHOCARDIOGRAM REPORT   Patient Name:   Holly Spence Date of Exam: 12/21/2023 Medical Rec #:  [id number]      Height:       63.0 in Accession #:    [id number]     Weight:       316.8 lb Date of Birth:   07-14-1945      BSA:          2.351 m Patient Age:    78 years       BP:           117/54 mmHg Patient Gender: F              HR:           71 bpm. Exam Location:  Inpatient Procedure: 2D Echo, Cardiac Doppler and Color Doppler (Both Spectral and Color            Flow Doppler were utilized during procedure). Indications:    CHF-Acute Diastolic I50.31  History:        Patient has prior history of Echocardiogram examinations, most                 recent 12/29/2022. Risk Factors:Hypertension and Diabetes.  Sonographer:    Jayson Gaskins Referring Phys: [id number] TIMOTHY S OPYD  Sonographer Comments: No apical window and suboptimal parasternal window. Image acquisition challenging due to patient body habitus. IMPRESSIONS  1. Technically difficult study with very limited views. Appears grossly normal LV systolic function, but limited evaluation. Left ventricular ejection fraction, by estimation, is 60 to 65%. The left ventricle has normal function. Left ventricular endocardial border not optimally defined to evaluate regional wall motion. There is mild left ventricular hypertrophy.  2. Right ventricular systolic function was not well visualized. The right ventricular size is not well visualized.  3. The mitral valve is degenerative. Trivial mitral valve regurgitation. Moderate mitral annular calcification.  4. The aortic valve was not well visualized. Aortic valve regurgitation is not visualized. Unable to measure aortic valve gradients to assess for aortic stenosis. FINDINGS  Left Ventricle: Left ventricular ejection fraction, by estimation, is 60 to 65%. The left ventricle has normal function. Left ventricular endocardial border not optimally defined to evaluate regional wall motion. The left ventricular internal cavity size was normal in size. There is mild left ventricular hypertrophy. Left ventricular diastolic parameters are indeterminate. Right Ventricle: The right ventricular size is not well visualized. Right  vetricular wall thickness was not well visualized. Right ventricular systolic function was not well visualized. Left Atrium: Left atrial size was not well visualized. Right  Atrium: Right atrial size was not well visualized. Pericardium: Trivial pericardial effusion is present. Mitral Valve: The mitral valve is degenerative in appearance. Moderate mitral annular calcification. Trivial mitral valve regurgitation. Tricuspid Valve: The tricuspid valve is normal in structure. Tricuspid valve regurgitation is trivial. Aortic Valve: The aortic valve was not well visualized. Aortic valve regurgitation is not visualized. Pulmonic Valve: The pulmonic valve was not well visualized. Pulmonic valve regurgitation is not visualized. Aorta: The ascending aorta was not well visualized. IAS/Shunts: The interatrial septum was not well visualized.  LEFT VENTRICLE PLAX 2D LVIDd:         4.90 cm LVIDs:         2.80 cm LV PW:         1.20 cm LV IVS:        1.00 cm LVOT diam:     1.90 cm LVOT Area:     2.84 cm   SHUNTS Systemic Diam: 1.90 cm Lonni Nanas MD Electronically signed by Lonni Nanas MD Signature Date/Time: 12/21/2023/9:53:08 AM    Final    US  Abdomen Limited RUQ (LIVER/GB) Result Date: 12/21/2023 CLINICAL DATA:  Elevated liver enzymes EXAM: ULTRASOUND ABDOMEN LIMITED RIGHT UPPER QUADRANT COMPARISON:  CT without contrast 09/16/2019 FINDINGS: Gallbladder: Status post cholecystectomy. Common bile duct: Diameter: 6 mm, normal for the post cholecystectomy state. No intrahepatic bile duct dilatation is seen. Liver: No focal lesion identified. There is loss of fine detail due to the patient's size. Grossly within normal limits in parenchymal echogenicity. Portal vein is patent on color Doppler imaging with normal direction of blood flow towards the liver. Other: None. IMPRESSION: 1. Status post cholecystectomy. No biliary dilatation. 2. No focal liver lesion identified.  Limited exam due to habitus. Electronically  Signed   By: Francis Quam M.D.   On: 12/21/2023 05:34   DG Chest Port 1 View Result Date: 12/20/2023 CLINICAL DATA:  Shortness of breath. EXAM: PORTABLE CHEST 1 VIEW COMPARISON:  02/21/2023. FINDINGS: Cardiomegaly. Stable left chest wall pacemaker. Moderate-sized left pleural effusion with associated left basilar consolidation/atelectasis. Bilateral interstitial prominence is favored to reflect interstitial pulmonary edema. No pneumothorax. No acute osseous abnormality. IMPRESSION: 1. Moderate-sized left pleural effusion with associated left basilar consolidation/atelectasis. 2. Cardiomegaly with interstitial pulmonary edema. Electronically Signed   By: Harrietta Sherry M.D.   On: 12/20/2023 19:39   CUP PACEART REMOTE DEVICE CHECK Result Date: 12/11/2023 PPM Scheduled remote reviewed. Normal device function.  Presenting rhythm:  AS/VP Next remote 91 days. LA, CVRS   Microbiology: Recent Results (from the past 240 hours)  Resp panel by RT-PCR (RSV, Flu A&B, Covid) Anterior Nasal Swab     Status: None   Collection Time: 12/20/23  6:07 PM   Specimen: Anterior Nasal Swab  Result Value Ref Range Status   SARS Coronavirus 2 by RT PCR NEGATIVE NEGATIVE Final   Influenza A by PCR NEGATIVE NEGATIVE Final   Influenza B by PCR NEGATIVE NEGATIVE Final    Comment: (NOTE) The Xpert Xpress SARS-CoV-2/FLU/RSV plus assay is intended as an aid in the diagnosis of influenza from Nasopharyngeal swab specimens and should not be used as a sole basis for treatment. Nasal washings and aspirates are unacceptable for Xpert Xpress SARS-CoV-2/FLU/RSV testing.  Fact Sheet for Patients: BloggerCourse.com  Fact Sheet for Healthcare Providers: SeriousBroker.it  This test is not yet approved or cleared by the [place]  FDA and has been authorized for detection and/or diagnosis of SARS-CoV-2 by FDA under an Emergency Use Authorization (EUA). This EUA will  remain in effect (meaning this test can be used) for the duration of the COVID-19 declaration under Section 564(b)(1) of the Act, 21 U.S.C. section 360bbb-3(b)(1), unless the authorization is terminated or revoked.     Resp Syncytial Virus by PCR NEGATIVE NEGATIVE Final    Comment: (NOTE) Fact Sheet for Patients: BloggerCourse.com  Fact Sheet for Healthcare Providers: SeriousBroker.it  This test is not yet approved or cleared by the [place]  FDA and has been authorized for detection and/or diagnosis of SARS-CoV-2 by FDA under an Emergency Use Authorization (EUA). This EUA will remain in effect (meaning this test can be used) for the duration of the COVID-19 declaration under Section 564(b)(1) of the Act, 21 U.S.C. section 360bbb-3(b)(1), unless the authorization is terminated or revoked.  Performed at [facility], [address] [place], [place] [address]   Blood culture (routine x 2)     Status: Abnormal   Collection Time: 12/20/23  7:05 PM   Specimen: BLOOD RIGHT HAND  Result Value Ref Range Status   Specimen Description BLOOD RIGHT HAND  Final   Special Requests   Final    BOTTLES DRAWN AEROBIC AND ANAEROBIC Blood Culture adequate volume   Culture  Setup Time   Final    GRAM POSITIVE RODS AEROBIC BOTTLE ONLY CRITICAL RESULT CALLED TO, READ BACK BY AND VERIFIED WITH: PHARMD JESSICA MILLEN [id number] [id number] BY J RAZZAK, MT    Culture (A)  Final    DIPHTHEROIDS(CORYNEBACTERIUM SPECIES) Standardized susceptibility testing for this organism is not available. Performed at [facility], [address] [place], [place] [address]    Report Status 12/24/2023 FINAL  Final  Blood culture (routine x 2)     Status: Abnormal   Collection Time: 12/20/23  9:23 PM   Specimen: BLOOD LEFT ARM  Result Value Ref Range Status   Specimen Description BLOOD LEFT ARM  Final   Special Requests   Final    BOTTLES DRAWN AEROBIC  AND ANAEROBIC Blood Culture results may not be optimal due to an inadequate volume of blood received in culture bottles   Culture  Setup Time   Final    GRAM POSITIVE COCCI IN CLUSTERS ANAEROBIC BOTTLE ONLY CRITICAL RESULT CALLED TO, READ BACK BY AND VERIFIED WITH: PHARMD LISA CURRAN ON 12/21/23 @ 1826 BY DRT    Culture (A)  Final    STAPHYLOCOCCUS EPIDERMIDIS THE SIGNIFICANCE OF ISOLATING THIS ORGANISM FROM A SINGLE SET OF BLOOD CULTURES WHEN MULTIPLE SETS ARE DRAWN IS UNCERTAIN. PLEASE NOTIFY THE MICROBIOLOGY DEPARTMENT WITHIN ONE WEEK IF SPECIATION AND SENSITIVITIES ARE REQUIRED. Performed at [facility], [address] [place], [place] [address]    Report Status 12/23/2023 FINAL  Final  Blood Culture ID Panel (Reflexed)     Status: Abnormal   Collection Time: 12/20/23  9:23 PM  Result Value Ref Range Status   Enterococcus faecalis NOT DETECTED NOT DETECTED Final   Enterococcus Faecium NOT DETECTED NOT DETECTED Final   Listeria monocytogenes NOT DETECTED NOT DETECTED Final   Staphylococcus species DETECTED (A) NOT DETECTED Final    Comment: CRITICAL RESULT CALLED TO, READ BACK BY AND VERIFIED WITH: PHARMD LISA CURRAN ON 12/21/23 @ 1826 BY DRT    Staphylococcus aureus (BCID) NOT DETECTED NOT DETECTED Final   Staphylococcus epidermidis DETECTED (A) NOT DETECTED Final    Comment: Methicillin (oxacillin) resistant coagulase negative staphylococcus. Possible blood culture contaminant (unless isolated from more than one blood culture draw or clinical case suggests pathogenicity).  No antibiotic treatment is indicated for blood  culture contaminants. CRITICAL RESULT CALLED TO, READ BACK BY AND VERIFIED WITH: PHARMD LISA CURRAN ON 12/21/23 @ 1826 BY DRT    Staphylococcus lugdunensis NOT DETECTED NOT DETECTED Final   Streptococcus species NOT DETECTED NOT DETECTED Final   Streptococcus agalactiae NOT DETECTED NOT DETECTED Final   Streptococcus pneumoniae NOT DETECTED NOT DETECTED Final    Streptococcus pyogenes NOT DETECTED NOT DETECTED Final   A.calcoaceticus-baumannii NOT DETECTED NOT DETECTED Final   Bacteroides fragilis NOT DETECTED NOT DETECTED Final   Enterobacterales NOT DETECTED NOT DETECTED Final   Enterobacter cloacae complex NOT DETECTED NOT DETECTED Final   Escherichia coli NOT DETECTED NOT DETECTED Final   Klebsiella aerogenes NOT DETECTED NOT DETECTED Final   Klebsiella oxytoca NOT DETECTED NOT DETECTED Final   Klebsiella pneumoniae NOT DETECTED NOT DETECTED Final   Proteus species NOT DETECTED NOT DETECTED Final   Salmonella species NOT DETECTED NOT DETECTED Final   Serratia marcescens NOT DETECTED NOT DETECTED Final   Haemophilus influenzae NOT DETECTED NOT DETECTED Final   Neisseria meningitidis NOT DETECTED NOT DETECTED Final   Pseudomonas aeruginosa NOT DETECTED NOT DETECTED Final   Stenotrophomonas maltophilia NOT DETECTED NOT DETECTED Final   Candida albicans NOT DETECTED NOT DETECTED Final   Candida auris NOT DETECTED NOT DETECTED Final   Candida glabrata NOT DETECTED NOT DETECTED Final   Candida krusei NOT DETECTED NOT DETECTED Final   Candida parapsilosis NOT DETECTED NOT DETECTED Final   Candida tropicalis NOT DETECTED NOT DETECTED Final   Cryptococcus neoformans/gattii NOT DETECTED NOT DETECTED Final   Methicillin resistance mecA/C DETECTED (A) NOT DETECTED Final    Comment: CRITICAL RESULT CALLED TO, READ BACK BY AND VERIFIED WITH: PHARMD LISA CURRAN ON 12/21/23 @ 1826 BY DRT Performed at [facility], [address] [place], [place] [address]      Labs: Basic Metabolic Panel: Recent Labs  Lab 12/25/23 0324 12/26/23 0249 12/27/23 0249 12/28/23 0241 12/29/23 0332  NA 139 141 142 140 141  K 3.9 3.5 3.6 3.6 3.4*  CL 92* 89* 90* 92* 95*  CO2 40* 41* 43* 36* 40*  GLUCOSE 104* 128* 142* 159* 129*  BUN 51* 46* 44* 39* 38*  CREATININE 2.19* 1.94* 1.79* 1.81* 1.82*  CALCIUM  8.2* 8.4* 8.7* 8.3* 8.3*  MG 2.0 1.8 2.0 2.0 2.0    Liver Function Tests: Recent Labs  Lab 12/24/23 0330 12/25/23 0324 12/26/23 0249 12/27/23 0249 12/28/23 0241  AST 67* 38 26 22 20   ALT 313* 201* 134* 101* 71*  ALKPHOS 83 64 53 58 53  BILITOT 1.2 1.3* 1.3* 1.1 1.0  PROT 5.5* 5.2* 4.8* 5.1* 4.9*  ALBUMIN 3.0* 2.7* 2.5* 2.6* 2.4*   No results for input(s): LIPASE, AMYLASE in the last 168 hours. No results for input(s): AMMONIA in the last 168 hours. CBC: Recent Labs  Lab 12/23/23 0313 12/25/23 0751 12/26/23 0249 12/27/23 0249  WBC 5.5 5.4 4.5 4.5  HGB 12.1 12.9 12.1 12.3  HCT 41.9 45.0 41.3 42.6  MCV 92.5 93.6 92.0 92.2  PLT 79* 59* 59* 67*   Cardiac Enzymes: No results for input(s): CKTOTAL, CKMB, CKMBINDEX, TROPONINI in the last 168 hours. BNP: BNP (last 3 results) Recent Labs    12/20/23 1821  BNP 1,019.0*    ProBNP (last 3 results) No results for input(s): PROBNP in the last 8760 hours.  CBG: Recent Labs  Lab 12/28/23 0620 12/28/23 1142 12/28/23 1545 12/28/23 2058 12/29/23 0618  GLUCAP  145* 177* 289* 184* 134*       Signed:  Sigurd Pac MD.  [facility] 12/29/2023, 10:07 AM

## 2023-12-29 NOTE — Progress Notes (Signed)
 RN called and provided report to [place] from [facility]. Patient made aware. Patient spoke to family in regards to discharge.

## 2023-12-29 NOTE — TOC Transition Note (Signed)
 Transition of Care [facility]) - Discharge Note   Patient Details  Name: Holly Spence MRN: [id number] Date of Birth: [birth year]  Transition of Care [facility]) CM/SW Contact:  Olam FORBES Ally, LCSW Phone Number: 12/29/2023, 11:02 AM Patient will DC un:[facility]? Anticipated DC date:?12/29/2023 Family notified:?Spouse Transport ab:EUJM   Per MD patient ready for DC to [facility].?. RN, patient, patient's family, and facility notified of DC. Discharge Summary sent to facility. RN given number for report  [phone] (nurse can ask for [facility] on [address]).     . DC packet on chart. Ambulance transport requested for patient.   CSW signing off.   Olam Ally, [place] [phone]   Clinical Narrative:       Final next level of care: Skilled Nursing Facility Barriers to Discharge: Barriers Resolved   Patient Goals and CMS Choice Patient states their goals for this hospitalization and ongoing recovery are:: To return home          Discharge Placement              Patient chooses bed at:  [facility]) Patient to be transferred to facility by: [facility] Name of family member notified: spouse Patient and family notified of of transfer: 12/29/23  Discharge Plan and Services Additional resources added to the After Visit Summary for   In-house Referral: Clinical Social Work Discharge Planning Services: CM Consult                                 Social Drivers of Health (SDOH) Interventions SDOH Screenings   Food Insecurity: No Food Insecurity (12/20/2023)  Housing: Unknown (12/20/2023)  Transportation Needs: No Transportation Needs (12/20/2023)  Utilities: Not At Risk (12/20/2023)  Depression (PHQ2-9): Low Risk  (02/03/2019)  Social Connections: Moderately Isolated (12/24/2023)  Tobacco Use: Medium Risk (12/20/2023)     Readmission Risk Interventions    12/21/2023    4:14 PM 12/30/2022    3:47 PM  Readmission Risk Prevention Plan  Transportation Screening Complete  Complete  PCP or Specialist Appt within 5-7 Days  Complete  Home Care Screening Complete Complete  Medication Review (RN CM) Complete Complete

## 2023-12-29 NOTE — Progress Notes (Addendum)
 Rounding Note   Patient Name: Holly Spence Date of Encounter: 12/29/2023  [facility] Cardiologist: Jennifer JONELLE Crape, MD   Subjective Diuresed another 1.1L negative-  now 14L negative overall, on oral diuretic. Creatinine stable at 1.82. Potassium 3.4 today - on standing supplementation.  Scheduled Meds:  amiodarone   200 mg Oral Daily   benzonatate   100 mg Oral TID   ferrous sulfate   325 mg Oral QHS   fluticasone  furoate-vilanterol  1 puff Inhalation Daily   gabapentin   300 mg Oral QHS   Gerhardt's butt cream   Topical BID   insulin  aspart  0-9 Units Subcutaneous TID WC   metoprolol  succinate  25 mg Oral Daily   nystatin    Topical BID   rOPINIRole   2 mg Oral q AM   sodium chloride  flush  3 mL Intravenous Q12H   torsemide   60 mg Oral Daily   warfarin  4 mg Oral ONCE-1600   [START ON 12/30/2023] warfarin  5 mg Oral q1600   Warfarin - Pharmacist Dosing Inpatient   Does not apply q1600   Continuous Infusions:  PRN Meds: acetaminophen  **OR** acetaminophen , albuterol , mouth rinse, oxyCODONE    Vital Signs  Vitals:   12/28/23 1941 12/29/23 0100 12/29/23 0410 12/29/23 0756  BP: (!) 114/54 (!) 102/39 (!) 117/48 (!) 90/36  Pulse: 65 63 61 60  Resp: 16 16  18   Temp: 98.3 F (36.8 C) 98.3 F (36.8 C) (!) 97.5 F (36.4 C) 98 F (36.7 C)  TempSrc: Oral Oral Oral Oral  SpO2: 97% 96% 95% 93%  Weight:   130.2 kg   Height:        Intake/Output Summary (Last 24 hours) at 12/29/2023 0844 Last data filed at 12/29/2023 0104 Gross per 24 hour  Intake 120 ml  Output 1250 ml  Net -1130 ml      12/29/2023    4:10 AM 12/28/2023    4:57 AM 12/27/2023    5:36 AM  Last 3 Weights  Weight (lbs) 287 lb 282 lb 284 lb  Weight (kg) 130.182 kg 127.914 kg 128.822 kg      Telemetry V paced- Personally Reviewed  ECG  N/A  Physical Exam  GEN: No acute distress.   Neck: Difficult to assess JVD given habitus Cardiac: RRR, no murmurs, rubs, or gallops.  Respiratory:  Diminished breath sounds GI: Soft, nontender, non-distended  MS: No edema Neuro:  Nonfocal  Psych: Normal affect   Labs High Sensitivity Troponin:   Recent Labs  Lab 12/20/23 1815 12/20/23 2016 12/21/23 1007  TROPONINIHS 410* 425* 442*     Chemistry Recent Labs  Lab 12/26/23 0249 12/27/23 0249 12/28/23 0241 12/29/23 0332  NA 141 142 140 141  K 3.5 3.6 3.6 3.4*  CL 89* 90* 92* 95*  CO2 41* 43* 36* 40*  GLUCOSE 128* 142* 159* 129*  BUN 46* 44* 39* 38*  CREATININE 1.94* 1.79* 1.81* 1.82*  CALCIUM  8.4* 8.7* 8.3* 8.3*  MG 1.8 2.0 2.0 2.0  PROT 4.8* 5.1* 4.9*  --   ALBUMIN 2.5* 2.6* 2.4*  --   AST 26 22 20   --   ALT 134* 101* 71*  --   ALKPHOS 53 58 53  --   BILITOT 1.3* 1.1 1.0  --   GFRNONAA 26* 28* 28* 28*  ANIONGAP 11 9 12 6     Lipids No results for input(s): CHOL, TRIG, HDL, LABVLDL, LDLCALC, CHOLHDL in the last 168 hours.  Hematology Recent Labs  Lab 12/25/23 [phone]  12/26/23 0249 12/27/23 0249  WBC 5.4 4.5 4.5  RBC 4.81 4.49 4.62  HGB 12.9 12.1 12.3  HCT 45.0 41.3 42.6  MCV 93.6 92.0 92.2  MCH 26.8 26.9 26.6  MCHC 28.7* 29.3* 28.9*  RDW 19.7* 19.4* 19.6*  PLT 59* 59* 67*   Thyroid  No results for input(s): TSH, FREET4 in the last 168 hours.  BNP No results for input(s): BNP, PROBNP in the last 168 hours.   DDimer No results for input(s): DDIMER in the last 168 hours.   Radiology  No results found.   Cardiac Studies   Patient Profile   78 y.o. female with history of atrial fibrillation, T2DM, COPD, CKD, diastolic heart failure who presents with acute hypoxic respiratory failure  Assessment & Plan  Acute on chronic diastolic heart failure: Echocardiogram with very limited views but grossly normal LV systolic function.  Renal and liver function continue to improve with diuresis - She has diuresed well, net -13.8 L on admission.  Suspect reaching euvolemia given soft pressures and worsening alkalosis, volume status appears  significantly improved on exam.   - Remains net negative on torsemide  60 mg daily -Strict I's/O's and daily weights  Elevated troponin: 410 > 425 > 442.  Flat troponin, not consistent with acute coronary syndrome.  Suspect demand ischemia in setting of decompensated heart failure.  Persistent Afib: Currently in atrial sensed, Vpaced rhythm.  Continue metoprolol  and amiodarone .  Eliquis  too expensive, transitioned to warfarin.  Will need follow-up in Coumadin  clinic on discharge  AKI on CKD: Creatinine 3.0 on admission.  Likely cardiorenal syndrome, has been improving with diuresis, creatinine 1.82 today  Morbid obesity: Body mass index is 50.84 kg/m.  Transaminitis: Suspect congestive hepatopathy, improving with diuresis.  Amiodarone  held given transaminitis, restarted given resolution  Overall appears well-diuresed and net negative on oral torsemide . Would work toward d/c from cardiology standpoint. Follow-up with Dr. Revankar.  [facility] will sign off.   Medication Recommendations:  as above Other recommendations (labs, testing, etc):  none Follow up as an outpatient:  Dr. Revenkar (6/24) and coumadin  clinic   For questions or updates, please contact [facility] Please consult [email or web address] for contact info under   Vinie KYM Maxcy, MD, [facility], FNLA, FACP  [place]  [facility]  Medical Director of the [facility]  [facility] Diplomate of the American Board of Clinical Lipidology Attending Cardiologist  Direct Dial: [phone]  Fax: [phone]  Website:  [email or web address]  Vinie JAYSON Maxcy, MD  12/29/2023, 8:44 AM

## 2023-12-29 NOTE — Progress Notes (Signed)
 PHARMACY - ANTICOAGULATION CONSULT NOTE  Pharmacy Consult for Warfarin Indication: atrial fibrillation  Allergies  Allergen Reactions   Penicillins Hives and Other (See Comments)    At injection site only- hives   Sulfa Antibiotics Hives   Erythromycin Other (See Comments)    Unsure    Azithromycin Rash and Other (See Comments)    Broke out the legs   Ciprofloxacin Hcl Other (See Comments)    Abdominal Pain    Patient Measurements: Height: 5' 3 (160 cm) Weight: 130.2 kg (287 lb) IBW/kg (Calculated) : 52.4 HEPARIN  DW (KG): 89  Vital Signs: Temp: 97.5 F (36.4 C) (06/21 0410) Temp Source: Oral (06/21 0410) BP: 117/48 (06/21 0410) Pulse Rate: 61 (06/21 0410)  Labs: Recent Labs    12/27/23 0249 12/28/23 0241 12/29/23 0332  HGB 12.3  --   --   HCT 42.6  --   --   PLT 67*  --   --   LABPROT 19.8* 24.3* 29.9*  INR 1.7* 2.2* 2.8*  CREATININE 1.79* 1.81* 1.82*    Estimated Creatinine Clearance: 32.5 mL/min (A) (by C-G formula based on SCr of 1.82 mg/dL (H)).   Medical History: Past Medical History:  Diagnosis Date   Acute on chronic diastolic CHF (congestive heart failure) (HCC) 12/20/2023   Acute renal failure superimposed on stage 3b chronic kidney disease (HCC) 12/20/2023   Acute respiratory failure with hypoxia (HCC) 12/20/2023   Allergic rhinitis 09/20/2015   Arthritis    Ascending aorta dilation (HCC) 12/09/2018   Back pain    Benign essential hypertension 09/20/2015   Bruises easily    Cellulitis of right leg 12/28/2022   Complete heart block (HCC) 06/11/2020   Contact lens/glasses fitting    COPD (chronic obstructive pulmonary disease) (HCC) 12/20/2023   Cough    Depression 02/08/2019   Diabetes mellitus due to underlying condition with unspecified complications (HCC) 11/10/2015   Duodenal ulcer 02/08/2019   Dyspnea and respiratory abnormality 11/07/2021   Elevated LFTs 12/20/2023   Essential hypertension 12/21/2023   Fatigue    Gastroesophageal  reflux disease 09/20/2015   GI bleeding 06/27/2019   Hx of laparoscopic gastric banding 01/31/2011   Surgery: 03/22/10    Hypercoagulable state due to persistent atrial fibrillation (HCC) 01/08/2023   Hyperlipidemia    Insulin  long-term use (HCC) 09/20/2015   Lactic acidosis 12/20/2023   Melena 02/08/2019   Moderate asthma 02/08/2019   New onset a-fib (HCC) 01/08/2023   Obesity, class 3 12/21/2023   Palpitations 11/10/2015   Persistent atrial fibrillation (HCC) 02/19/2023   Poor circulation    Restless leg syndrome 02/08/2019   Right renal mass 02/08/2019   Sinus problem    Sleep apnea    SOB (shortness of breath)    Symptomatic bradycardia 06/11/2020   Thrombocytopenia (HCC) 12/20/2023   Transaminitis 12/22/2023   Troponin level elevated 12/20/2023   Type 2 diabetes mellitus with hyperlipidemia (HCC) 11/10/2015   Uncontrolled type 2 diabetes mellitus without complication, with long-term current use of insulin  09/20/2015   Assessment: Holly Spence is a 78 year old female with PMH DM2, HLD, OSA, and PAF. She is being switched from Eliquis  to warfarin as the result of cost issues. Her CBC is stable. Her last dose of Eliquis  2.5 mg was 12/24/23 AM. Given CHADSVAS 6, no recent cardioversion, and no recent ablaitons, patient will not be bridged.  INR is still therapeutic at 2.8 after 2 boost doses, but lare jump in INR from 2.2>>2.8.  CBC is stable and  no signs of bleeding. Patient's diet is unchanged and she is eating well. Amiodarone  restarted on 6/19 which might be affecting INR to some extent. Patient has post hospital INR check scheduled 01/01/24 with [facility] in [place].  Goal of Therapy:  INR 2-3 Monitor platelets by anticoagulation protocol: Yes   Plan:  Warfarin 4mg  today then resume 5mg  daily   Monitor INR, CBC, and signs of bleeding Monitor diet and DDI with concomitant therapies    [name], PharmD, BCPS, FNKF Clinical Pharmacist [place] Please utilize Amion  for appropriate phone number to reach the unit pharmacist [facility])  12/29/2023 7:25 AM

## 2023-12-31 NOTE — Telephone Encounter (Signed)
 Patient cancelled 6/24 Coumadin  Clinic appointment because she has been admitted to rehab. She didn't reschedule because she doesn't know when she will be discharged.

## 2024-01-01 ENCOUNTER — Encounter

## 2024-01-01 ENCOUNTER — Ambulatory Visit

## 2024-01-01 ENCOUNTER — Ambulatory Visit: Admitting: Cardiology

## 2024-02-01 ENCOUNTER — Ambulatory Visit: Payer: Self-pay | Admitting: Cardiology

## 2024-02-04 NOTE — Progress Notes (Signed)
 Remote pacemaker transmission.

## 2024-03-11 DIAGNOSIS — I5023 Acute on chronic systolic (congestive) heart failure: Secondary | ICD-10-CM | POA: Diagnosis not present

## 2024-03-27 ENCOUNTER — Other Ambulatory Visit (HOSPITAL_COMMUNITY): Payer: Self-pay

## 2024-03-28 ENCOUNTER — Other Ambulatory Visit (HOSPITAL_COMMUNITY): Payer: Self-pay

## 2024-03-28 ENCOUNTER — Other Ambulatory Visit (HOSPITAL_BASED_OUTPATIENT_CLINIC_OR_DEPARTMENT_OTHER): Payer: Self-pay

## 2024-03-31 ENCOUNTER — Ambulatory Visit (INDEPENDENT_AMBULATORY_CARE_PROVIDER_SITE_OTHER)

## 2024-03-31 DIAGNOSIS — I442 Atrioventricular block, complete: Secondary | ICD-10-CM | POA: Diagnosis not present

## 2024-03-31 LAB — CUP PACEART REMOTE DEVICE CHECK
Battery Remaining Longevity: 60 mo
Battery Remaining Percentage: 48 %
Battery Voltage: 2.99 V
Brady Statistic AP VP Percent: 13 %
Brady Statistic AP VS Percent: 1 %
Brady Statistic AS VP Percent: 87 %
Brady Statistic AS VS Percent: 1 %
Brady Statistic RA Percent Paced: 13 %
Brady Statistic RV Percent Paced: 99 %
Date Time Interrogation Session: 20250920141134
Implantable Lead Connection Status: 753985
Implantable Lead Connection Status: 753985
Implantable Lead Implant Date: 20211203
Implantable Lead Implant Date: 20211203
Implantable Lead Location: 753859
Implantable Lead Location: 753860
Implantable Pulse Generator Implant Date: 20211203
Lead Channel Impedance Value: 340 Ohm
Lead Channel Impedance Value: 590 Ohm
Lead Channel Pacing Threshold Amplitude: 0.375 V
Lead Channel Pacing Threshold Amplitude: 0.625 V
Lead Channel Pacing Threshold Pulse Width: 0.5 ms
Lead Channel Pacing Threshold Pulse Width: 0.5 ms
Lead Channel Sensing Intrinsic Amplitude: 0.3 mV
Lead Channel Sensing Intrinsic Amplitude: 3.7 mV
Lead Channel Setting Pacing Amplitude: 0.625
Lead Channel Setting Pacing Amplitude: 1.625
Lead Channel Setting Pacing Pulse Width: 0.5 ms
Lead Channel Setting Sensing Sensitivity: 2 mV
Pulse Gen Model: 2272
Pulse Gen Serial Number: 3879909

## 2024-04-01 NOTE — Progress Notes (Signed)
 Remote PPM Transmission

## 2024-04-02 ENCOUNTER — Ambulatory Visit

## 2024-04-02 ENCOUNTER — Ambulatory Visit: Payer: Self-pay | Admitting: Cardiology

## 2024-04-07 ENCOUNTER — Other Ambulatory Visit (HOSPITAL_COMMUNITY): Payer: Self-pay

## 2024-04-15 ENCOUNTER — Ambulatory Visit

## 2024-04-18 ENCOUNTER — Ambulatory Visit

## 2024-04-21 ENCOUNTER — Ambulatory Visit

## 2024-04-21 VITALS — BP 134/88 | HR 60 | Ht 63.0 in | Wt 283.0 lb

## 2024-04-21 DIAGNOSIS — I442 Atrioventricular block, complete: Secondary | ICD-10-CM | POA: Diagnosis not present

## 2024-04-21 DIAGNOSIS — I5032 Chronic diastolic (congestive) heart failure: Secondary | ICD-10-CM | POA: Insufficient documentation

## 2024-04-21 DIAGNOSIS — I10 Essential (primary) hypertension: Secondary | ICD-10-CM | POA: Insufficient documentation

## 2024-04-21 DIAGNOSIS — I4819 Other persistent atrial fibrillation: Secondary | ICD-10-CM | POA: Diagnosis present

## 2024-04-21 DIAGNOSIS — N184 Chronic kidney disease, stage 4 (severe): Secondary | ICD-10-CM | POA: Diagnosis present

## 2024-04-21 NOTE — Assessment & Plan Note (Signed)
 BMI 50.1. Significantly limiting her mobility. Markedly deconditioned over the past couple years and limited to wheelchair for mobility. Continue with dietary modifications calorie restriction to help with weight loss.

## 2024-04-21 NOTE — Assessment & Plan Note (Signed)
 Reasonably controlled at this time. Continue metoprolol  XL 25 mg once daily.

## 2024-04-21 NOTE — Assessment & Plan Note (Signed)
 S/p permanent pacemaker implant 06/11/2020. Last device check 03/29/2024 with well-functioning device.  Continues to follow-up with device clinic.

## 2024-04-21 NOTE — Progress Notes (Signed)
 Cardiology Consultation:    Date:  04/21/2024   ID:  Holly Spence, DOB 04/07/1946, MRN [id number]  PCP:  [facility]  Cardiologist:  Holly SAUNDERS Altonio Schwertner, MD   Referring MD: [facility]   No chief complaint on file.    ASSESSMENT AND PLAN:   Ms. Holly Spence 78 year old woman  history of chronic diastolic CHF, morbid obesity, congestive hepatopathy, complete heart block s/p dual-chamber permanent pacemaker 06/11/2020, persistent atrial fibrillation/flutter [device identified on pacemaker checks since June 2022], CKD stage IV, diabetes mellitus, COPD uses nasal flow oxygen as needed, thrombocytopenia, diabetes mellitus. Echocardiogram 03/11/2024 at [facility] limited views, grossly normal LV function. Prior echocardiogram 12/21/2023 at [facility] reported technically difficult study with LVEF grossly preserved 60 to 65%.  Previously followed up with Dr. Edwyna last visit in 2023. Had been following up with A-fib clinic and pacemaker clinic. Now here to reestablish care with cardiology after her last 2 admissions for decompensated heart failure in the past 4 months, with most recent in September 2025 at [facility]. Has been at [place] rehab facility since her discharge from [facility] in June 2025.  Appears to be at her baseline currently.  Problem List Items Addressed This Visit     Complete heart block (HCC) - Primary   S/p permanent pacemaker implant 06/11/2020. Last device check 03/29/2024 with well-functioning device.  Continues to follow-up with device clinic.       Relevant Medications   ELIQUIS  2.5 MG TABS tablet   Other Relevant Orders   EKG 12-Lead (Completed)   Persistent atrial fibrillation (HCC)   History of device identified paroxysmal atrial fibrillation initially June 2022. Subsequently persistent episodes on device checks. Rhythm control considered with Tikosyn  August 2024, QTc prolonged  hence started on amiodarone . Remains on amiodarone  200 mg once daily for rhythm control.  CHA2DS2-VASc score elevated at least 5. Has been on Eliquis  low-dose 2.5 mg twice daily considering her age and renal function. Tolerating well. Continue the same.       Relevant Medications   ELIQUIS  2.5 MG TABS tablet   Chronic diastolic CHF (congestive heart failure) (HCC)   Technically difficult echocardiograms from June 2025 at [facility] and September 2025 at [facility] with limited views, grossly preserved LV function.  Hypervolemic Weight 283 pounds today appears lower than her recent discharge weight from [facility] of 296 pounds.  Continue with torsemide  60 mg once daily. Continue metoprolol  XL 25 mg once daily. Not a candidate for further GDMT escalation with her renal function.  Importance of dietary sodium restriction reviewed and she acknowledges however has been having difficult time maintaining low-sodium diet at [facility] rehab facility.  She does need to follow-up with nephrologist as outpatient, unclear if she has had follow-up since her discharge from [facility] as she was recommended considering her CKD stage IV. will request office to send in a referral for nephrologist if she has already not established.      Relevant Medications   ELIQUIS  2.5 MG TABS tablet   Chronic kidney disease, stage IV (severe) (HCC)   Significant renal dysfunction. Unclear if she has establish care with nephrologist as outpatient. No recent lab work. Will request outpatient lab work and referral to nephrologist to establish care if this is already not been done. Will defer further management and follow-up to primary team and nephrologist.  In view of her CHF and CKD, high risk for fluid overload  and recurrent decompensated heart failure and need for close monitoring. Will request to continue following up closely with her primary team.         Essential hypertension   Reasonably controlled at this time. Continue metoprolol  XL 25 mg once daily.       Relevant Medications   ELIQUIS  2.5 MG TABS tablet   Morbid obesity (HCC)   BMI 50.1. Significantly limiting her mobility. Markedly deconditioned over the past couple years and limited to wheelchair for mobility. Continue with dietary modifications calorie restriction to help with weight loss.       Relevant Medications   HUMULIN N KWIKPEN 100 UNIT/ML KwikPen    Return to clinic tentatively in 3 months.  History of Present Illness:    Holly Spence is a 78 y.o. female who is being seen today for establishing care with a cardiologist. Following up with the atrial fibrillation clinic previously, last visit with them was March 05, 2023. Here at our office follows up with Holly Spence and last visit appears to have been 11/08/2021.  Complicated visit with patient herself being limited historian with regards to her recent hospital visits.  Multiple records from [facility]  about her recent hospitalizations and also outside electronic medical records from [facility] reviewed. Wheelchair restricted chronically and is currently here for the visit from [place] rehab facility.  Has history of chronic diastolic CHF, morbid obesity, congestive hepatopathy, complete heart block s/p dual-chamber permanent pacemaker 06/11/2020, persistent atrial fibrillation/flutter [device identified on pacemaker checks since June 2022], CKD stage IV, diabetes mellitus, COPD uses nasal flow oxygen as needed, thrombocytopenia, diabetes mellitus. Echocardiogram 03/11/2024 at [facility] limited views, grossly normal LV function. Prior echocardiogram 12/21/2022 at [facility] reported technically difficult study with LVEF grossly preserved 60 to 65%.  Recently admitted and discharged from [facility] between 12/20/2023 through 12/29/2023.  Discharge weight was 287 pounds She  has been in [place] rehab facility since then.  Had an inpatient stay at [facility] between 03/10/2024 through 03/13/2024 and discharged back to [facility] rehab facility.  Discharge weight was 296 pounds . Was admitted for acute on chronic respiratory failure complicated by acute CHF and acute renal injury on chronic kidney disease with hyperkalemia, responded to treatment for community-acquired pneumonia and IV diuresis.  And was seen by nephrology team inpatient and recommended outpatient nephrology follow-up.  Here from cardiac rehab today.  Sitting comfortably in her wheelchair.  Mentions that she uses oxygen only on an as-needed basis. Denies any chest pain. Mentions her breathing is at her baseline currently not labored.  Reportedly feels much better than her last admission at [facility] in September. Denies any palpitations.  Denies any lightheadedness or dizziness.  Mentions diet at rehab facility provider and is typically not sodium restricted. Her weight today at the office was 283 pounds.  Mentions good compliance with medications as provided at rehab facility.  EKG in the clinic today shows AV dual paced rhythm.  Last pacer check 03/29/2024 reported predominantly a sensed V paced, A-fib burden 4.5% [with less than 1% during the recent review.]  Labs at discharge from [facility] 03/13/2024 noted hemoglobin 9.7, hematocrit 30.2, WBC 10, platelets 215 Sodium 137 potassium 4.5, BUN 75, creatinine 2.2, eGFR 22 proBNP was 6420 on 03/10/2024  Past Medical History:  Diagnosis Date   Acute on chronic diastolic CHF (congestive heart failure) (HCC) 12/20/2023   Acute renal failure superimposed on stage 3b chronic kidney disease (  HCC) 12/20/2023   Acute respiratory failure with hypoxia (HCC) 12/20/2023   Allergic rhinitis 09/20/2015   Arthritis    Ascending aorta dilation 12/09/2018   Back pain    Benign essential hypertension 09/20/2015   Bruises  easily    Cellulitis of right leg 12/28/2022   Complete heart block (HCC) 06/11/2020   Contact lens/glasses fitting    COPD (chronic obstructive pulmonary disease) (HCC) 12/20/2023   Cough    Depression 02/08/2019   Diabetes mellitus due to underlying condition with unspecified complications (HCC) 11/10/2015   Duodenal ulcer 02/08/2019   Dyspnea and respiratory abnormality 11/07/2021   Elevated LFTs 12/20/2023   Essential hypertension 12/21/2023   Fatigue    Gastroesophageal reflux disease 09/20/2015   GI bleeding 06/27/2019   Hx of laparoscopic gastric banding 01/31/2011   Surgery: 03/22/10    Hypercoagulable state due to persistent atrial fibrillation (HCC) 01/08/2023   Hyperlipidemia    Insulin  long-term use (HCC) 09/20/2015   Lactic acidosis 12/20/2023   Melena 02/08/2019   Moderate asthma 02/08/2019   New onset a-fib (HCC) 01/08/2023   Obesity, class 3 (HCC) 12/21/2023   Palpitations 11/10/2015   Persistent atrial fibrillation (HCC) 02/19/2023   Poor circulation    Restless leg syndrome 02/08/2019   Right renal mass 02/08/2019   Sinus problem    Sleep apnea    SOB (shortness of breath)    Symptomatic bradycardia 06/11/2020   Thrombocytopenia 12/20/2023   Transaminitis 12/22/2023   Troponin level elevated 12/20/2023   Type 2 diabetes mellitus with hyperlipidemia (HCC) 11/10/2015   Uncontrolled type 2 diabetes mellitus without complication, with long-term current use of insulin  09/20/2015    Past Surgical History:  Procedure Laterality Date   CATARACT EXTRACTION     confirm date with patient   CHOLECYSTECTOMY  1987   EYE SURGERY  2003   retina   HERNIA REPAIR     LAPAROSCOPIC GASTRIC BANDING  03/22/10   PACEMAKER IMPLANT N/A 06/11/2020   Procedure: PACEMAKER IMPLANT;  Surgeon: Cindie Ole DASEN, MD;  Location: [facility] INVASIVE CV LAB;  Service: Cardiovascular;  Laterality: N/A;    Current Medications: Current Meds  Medication Sig   albuterol  (VENTOLIN  HFA) 108 (90  Base) MCG/ACT inhaler Inhale 2 puffs into the lungs every 6 (six) hours as needed for wheezing or shortness of breath.    amiodarone  (PACERONE ) 200 MG tablet Take 1 tablet (200 mg total) by mouth daily.   cyanocobalamin 1000 MCG tablet Take 1,000 mcg by mouth in the morning.   diclofenac (VOLTAREN) 50 MG EC tablet Take 50 mg by mouth 2 (two) times daily.   ELIQUIS  2.5 MG TABS tablet Take 2.5 mg by mouth 2 (two) times daily.   ferrous sulfate  325 (65 FE) MG EC tablet Take 325 mg by mouth at bedtime.   fluticasone  furoate-vilanterol (BREO ELLIPTA ) 200-25 MCG/ACT AEPB Inhale 1 puff into the lungs daily.   gabapentin  (NEURONTIN ) 300 MG capsule Take 1 capsule (300 mg total) by mouth at bedtime.   HUMULIN N KWIKPEN 100 UNIT/ML KwikPen Inject into the skin. Inject 39 units 2 times a day   insulin  NPH-regular Human (70-30) 100 UNIT/ML injection Inject 25 Units into the skin 2 (two) times daily with a meal. Per sliding scale   liver oil-zinc  oxide (DESITIN) 40 % ointment Apply topically 2 (two) times daily. (Patient taking differently: Apply 1 Application topically 3 (three) times daily as needed for irritation.)   metoprolol  succinate (TOPROL  XL) 25 MG 24 hr tablet Take  1 tablet (25 mg total) by mouth daily. (Patient taking differently: Take 25 mg by mouth in the morning.)   omeprazole (PRILOSEC) 40 MG capsule Take 40 mg by mouth daily.   potassium chloride  SA (KLOR-CON  M) 20 MEQ tablet Take 20 mEq by mouth 2 (two) times daily.   rOPINIRole  (REQUIP ) 2 MG tablet Take 1 tablet (2 mg total) by mouth at bedtime.   torsemide  (DEMADEX ) 20 MG tablet Take 3 tablets (60 mg total) by mouth daily. (Patient taking differently: Take 60 mg by mouth in the morning.)   traMADol  (ULTRAM ) 50 MG tablet Take 2 tablets (100 mg total) by mouth every 12 (twelve) hours as needed.   traZODone (DESYREL) 50 MG tablet Take 50 mg by mouth at bedtime.   [DISCONTINUED] diclofenac sodium (VOLTAREN) 1 % GEL Apply 2-4 g topically every 6  (six) hours as needed (pain).     Allergies:   Penicillins, Sulfa antibiotics, Erythromycin, Azithromycin, and Ciprofloxacin hcl   Social History   Socioeconomic History   Marital status: Married    Spouse name: Not on file   Number of children: Not on file   Years of education: Not on file   Highest education level: Not on file  Occupational History   Not on file  Tobacco Use   Smoking status: Former    Current packs/day: 0.00    Types: Cigarettes    Quit date: 2001    Years since quitting: 24.7   Smokeless tobacco: Never  Vaping Use   Vaping status: Never Used  Substance and Sexual Activity   Alcohol use: No   Drug use: No   Sexual activity: Not on file  Other Topics Concern   Not on file  Social History Narrative   Not on file   Social Drivers of Health   Financial Resource Strain: Not on file  Food Insecurity: No Food Insecurity (12/20/2023)   Hunger Vital Sign    Worried About Running Out of Food in the Last Year: Never true    Ran Out of Food in the Last Year: Never true  Transportation Needs: No Transportation Needs (12/20/2023)   PRAPARE - Administrator, Civil Service (Medical): No    Lack of Transportation (Non-Medical): No  Physical Activity: Not on file  Stress: Not on file  Social Connections: Moderately Isolated (12/24/2023)   Social Connection and Isolation Panel    Frequency of Communication with Friends and Family: Twice a week    Frequency of Social Gatherings with Friends and Family: Twice a week    Attends Religious Services: Never    Database administrator or Organizations: No    Attends Engineer, structural: Never    Marital Status: Married     Family History: The patient's family history includes Arthritis in her mother; Diabetes in her father and sister; Heart disease in her father and mother; Hypertension in her mother. ROS:   Please see the history of present illness.    All 14 point review of systems negative  except as described per history of present illness.  EKGs/Labs/Other Studies Reviewed:    The following studies were reviewed today:   EKG:  EKG Interpretation Date/Time:  Monday April 21 2024 15:58:35 EDT Ventricular Rate:  60 PR Interval:  226 QRS Duration:  170 QT Interval:  486 QTC Calculation: 486 R Axis:   268  Text Interpretation: AV dual-paced rhythm with prolonged AV conduction When compared with ECG of 20-Dec-2023 18:29, PREVIOUS  ECG IS PRESENT Confirmed by Liborio Hai reddy [phone]) on 04/21/2024 4:32:28 PM    Recent Labs: 09/25/2023: TSH 0.09 12/20/2023: B Natriuretic Peptide 1,019.0 12/27/2023: Hemoglobin 12.3; Platelets 67 12/28/2023: ALT 71 12/29/2023: BUN 38; Creatinine, Ser 1.82; Magnesium  2.0; Potassium 3.4; Sodium 141  Recent Lipid Panel No results found for: CHOL, TRIG, HDL, CHOLHDL, VLDL, LDLCALC, LDLDIRECT  Physical Exam:    VS:  BP 134/88   Pulse 60   Ht 5' 3 (1.6 m)   Wt 283 lb (128.4 kg)   SpO2 96%   BMI 50.13 kg/m     Wt Readings from Last 3 Encounters:  04/21/24 283 lb (128.4 kg)  12/29/23 287 lb (130.2 kg)  03/05/23 295 lb 6.4 oz (134 kg)     GENERAL: Morbidly obese woman sitting comfortably in her wheelchair.   no acute distress NECK: Difficult to assess JVD with her body habitus. CARDIAC: RRR, S1 and S2 present,, no significant murmur audible.  CHEST: Bilateral respiratory sounds muffled Extremities: Diffuse bilateral pitting pedal edema with superficial wounds that are been wrapped.  Pulses bilaterally symmetric with radial 2+ and dorsalis pedis 1+ NEUROLOGIC:  Alert and oriented x 3  Medication Adjustments/Labs and Tests Ordered: Current medicines are reviewed at length with the patient today.  Concerns regarding medicines are outlined above.  Orders Placed This Encounter  Procedures   EKG 12-Lead   No orders of the defined types were placed in this encounter.   Signed, Hai jess Liborio, MD, MPH,  [facility]. 04/21/2024 7:11 PM    [facility]

## 2024-04-21 NOTE — Assessment & Plan Note (Signed)
 History of device identified paroxysmal atrial fibrillation initially June 2022. Subsequently persistent episodes on device checks. Rhythm control considered with Tikosyn  August 2024, QTc prolonged hence started on amiodarone . Remains on amiodarone  200 mg once daily for rhythm control.  CHA2DS2-VASc score elevated at least 5. Has been on Eliquis  low-dose 2.5 mg twice daily considering her age and renal function. Tolerating well. Continue the same.

## 2024-04-21 NOTE — Patient Instructions (Signed)
 Medication Instructions:  Your physician recommends that you continue on your current medications as directed. Please refer to the Current Medication list given to you today.  *If you need a refill on your cardiac medications before your next appointment, please call your pharmacy*  Lab Work: None If you have labs (blood work) drawn today and your tests are completely normal, you will receive your results only by: MyChart Message (if you have MyChart) OR A paper copy in the mail If you have any lab test that is abnormal or we need to change your treatment, we will call you to review the results.  Testing/Procedures: None  Follow-Up: At [facility], you and your health needs are our priority.  As part of our continuing mission to provide you with exceptional heart care, our providers are all part of one team.  This team includes your primary Cardiologist (physician) and Advanced Practice Providers or APPs (Physician Assistants and Nurse Practitioners) who all work together to provide you with the care you need, when you need it.  Your next appointment:   3 month(s)  Provider:   Huntley Dec, MD    We recommend signing up for the patient portal called "MyChart".  Sign up information is provided on this After Visit Summary.  MyChart is used to connect with patients for Virtual Visits (Telemedicine).  Patients are able to view lab/test results, encounter notes, upcoming appointments, etc.  Non-urgent messages can be sent to your provider as well.   To learn more about what you can do with MyChart, go to [email or web address].   Other Instructions None

## 2024-04-21 NOTE — Assessment & Plan Note (Addendum)
 Technically difficult echocardiograms from June 2025 at [facility] and September 2025 at [facility] with limited views, grossly preserved LV function.  Hypervolemic Weight 283 pounds today appears lower than her recent discharge weight from [facility] of 296 pounds.  Continue with torsemide  60 mg once daily. Continue metoprolol  XL 25 mg once daily. Not a candidate for further GDMT escalation with her renal function.  Importance of dietary sodium restriction reviewed and she acknowledges however has been having difficult time maintaining low-sodium diet at [facility] rehab facility.  She does need to follow-up with nephrologist as outpatient, unclear if she has had follow-up since her discharge from [facility] as she was recommended considering her CKD stage IV. will request office to send in a referral for nephrologist if she has already not established.

## 2024-04-21 NOTE — Assessment & Plan Note (Signed)
 Significant renal dysfunction. Unclear if she has establish care with nephrologist as outpatient. No recent lab work. Will request outpatient lab work and referral to nephrologist to establish care if this is already not been done. Will defer further management and follow-up to primary team and nephrologist.  In view of her CHF and CKD, high risk for fluid overload and recurrent decompensated heart failure and need for close monitoring. Will request to continue following up closely with her primary team.

## 2024-04-28 ENCOUNTER — Other Ambulatory Visit: Payer: Self-pay

## 2024-04-28 DIAGNOSIS — N184 Chronic kidney disease, stage 4 (severe): Secondary | ICD-10-CM

## 2024-06-30 ENCOUNTER — Ambulatory Visit

## 2024-06-30 DIAGNOSIS — I442 Atrioventricular block, complete: Secondary | ICD-10-CM | POA: Diagnosis not present

## 2024-07-01 LAB — CUP PACEART REMOTE DEVICE CHECK
Battery Remaining Longevity: 52 mo
Battery Remaining Percentage: 45 %
Battery Voltage: 2.98 V
Brady Statistic AP VP Percent: 13 %
Brady Statistic AP VS Percent: 1 %
Brady Statistic AS VP Percent: 87 %
Brady Statistic AS VS Percent: 1 %
Brady Statistic RA Percent Paced: 13 %
Brady Statistic RV Percent Paced: 99 %
Date Time Interrogation Session: 20251222020013
Implantable Lead Connection Status: 753985
Implantable Lead Connection Status: 753985
Implantable Lead Implant Date: 20211203
Implantable Lead Implant Date: 20211203
Implantable Lead Location: 753859
Implantable Lead Location: 753860
Implantable Pulse Generator Implant Date: 20211203
Lead Channel Impedance Value: 330 Ohm
Lead Channel Impedance Value: 550 Ohm
Lead Channel Pacing Threshold Amplitude: 0.625 V
Lead Channel Pacing Threshold Amplitude: 0.75 V
Lead Channel Pacing Threshold Pulse Width: 0.5 ms
Lead Channel Pacing Threshold Pulse Width: 0.5 ms
Lead Channel Sensing Intrinsic Amplitude: 0.3 mV
Lead Channel Sensing Intrinsic Amplitude: 4.3 mV
Lead Channel Setting Pacing Amplitude: 0.875
Lead Channel Setting Pacing Amplitude: 1.75 V
Lead Channel Setting Pacing Pulse Width: 0.5 ms
Lead Channel Setting Sensing Sensitivity: 2 mV
Pulse Gen Model: 2272
Pulse Gen Serial Number: 3879909

## 2024-07-02 NOTE — Progress Notes (Signed)
 Remote PPM Transmission

## 2024-07-04 ENCOUNTER — Ambulatory Visit: Payer: Self-pay | Admitting: Cardiology

## 2024-07-22 ENCOUNTER — Ambulatory Visit

## 2024-07-24 ENCOUNTER — Ambulatory Visit

## 2024-08-06 ENCOUNTER — Other Ambulatory Visit: Payer: Self-pay

## 2024-08-10 DEATH — deceased

## 2024-09-29 ENCOUNTER — Encounter
